# Patient Record
Sex: Female | Born: 1985 | Race: White | Hispanic: No | Marital: Married | Smoking: Never smoker
Health system: Southern US, Community
[De-identification: ages and names within clinical notes are randomized; demographics above are authoritative.]

## PROBLEM LIST (undated history)

## (undated) DIAGNOSIS — N809 Endometriosis, unspecified: Secondary | ICD-10-CM

## (undated) DIAGNOSIS — F419 Anxiety disorder, unspecified: Secondary | ICD-10-CM

## (undated) DIAGNOSIS — T7840XA Allergy, unspecified, initial encounter: Secondary | ICD-10-CM

## (undated) DIAGNOSIS — J939 Pneumothorax, unspecified: Secondary | ICD-10-CM

## (undated) DIAGNOSIS — T8859XA Other complications of anesthesia, initial encounter: Secondary | ICD-10-CM

## (undated) DIAGNOSIS — K648 Other hemorrhoids: Secondary | ICD-10-CM

## (undated) DIAGNOSIS — D649 Anemia, unspecified: Secondary | ICD-10-CM

## (undated) HISTORY — PX: LAPAROSCOPIC BOWEL RESECTION: SUR754

## (undated) HISTORY — PX: WISDOM TOOTH EXTRACTION: SHX21

## (undated) HISTORY — DX: Pneumothorax, unspecified: J93.9

## (undated) HISTORY — PX: ABDOMINAL HYSTERECTOMY: SHX81

## (undated) HISTORY — DX: Anxiety disorder, unspecified: F41.9

## (undated) HISTORY — DX: Other hemorrhoids: K64.8

## (undated) HISTORY — DX: Allergy, unspecified, initial encounter: T78.40XA

## (undated) HISTORY — PX: APPENDECTOMY: SHX54

## (undated) HISTORY — PX: TONSILLECTOMY: SUR1361

## (undated) HISTORY — DX: Endometriosis, unspecified: N80.9

---
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
## 2006-07-12 ENCOUNTER — Other Ambulatory Visit: Admission: RE | Admit: 2006-07-12 | Discharge: 2006-07-12 | Payer: Self-pay | Admitting: Family Medicine

## 2007-07-25 ENCOUNTER — Other Ambulatory Visit: Admission: RE | Admit: 2007-07-25 | Discharge: 2007-07-25 | Payer: Self-pay | Admitting: Family Medicine

## 2008-07-26 ENCOUNTER — Other Ambulatory Visit: Admission: RE | Admit: 2008-07-26 | Discharge: 2008-07-26 | Payer: Self-pay | Admitting: Family Medicine

## 2009-07-27 ENCOUNTER — Other Ambulatory Visit: Admission: RE | Admit: 2009-07-27 | Discharge: 2009-07-27 | Payer: Self-pay | Admitting: Family Medicine

## 2010-08-08 ENCOUNTER — Other Ambulatory Visit: Payer: Self-pay | Admitting: Family Medicine

## 2010-08-09 ENCOUNTER — Other Ambulatory Visit (HOSPITAL_COMMUNITY)
Admission: RE | Admit: 2010-08-09 | Discharge: 2010-08-09 | Disposition: A | Discharge status: Home / Self Care | Payer: BC Managed Care – PPO | Source: Ambulatory Visit | Attending: Family Medicine | Admitting: Family Medicine

## 2010-08-09 DIAGNOSIS — Z0000 Encounter for general adult medical examination without abnormal findings: Secondary | ICD-10-CM | POA: Insufficient documentation

## 2014-11-15 ENCOUNTER — Other Ambulatory Visit: Payer: Self-pay | Admitting: Otolaryngology

## 2014-11-15 DIAGNOSIS — H9209 Otalgia, unspecified ear: Secondary | ICD-10-CM

## 2014-11-15 DIAGNOSIS — H7290 Unspecified perforation of tympanic membrane, unspecified ear: Secondary | ICD-10-CM

## 2014-11-15 DIAGNOSIS — J342 Deviated nasal septum: Secondary | ICD-10-CM

## 2014-11-15 DIAGNOSIS — H6532 Chronic mucoid otitis media, left ear: Secondary | ICD-10-CM

## 2014-11-15 DIAGNOSIS — H9210 Otorrhea, unspecified ear: Secondary | ICD-10-CM

## 2014-11-16 ENCOUNTER — Other Ambulatory Visit: Payer: Self-pay | Admitting: Otolaryngology

## 2014-11-16 DIAGNOSIS — J342 Deviated nasal septum: Secondary | ICD-10-CM

## 2014-11-16 DIAGNOSIS — H9209 Otalgia, unspecified ear: Secondary | ICD-10-CM

## 2014-11-16 DIAGNOSIS — H9210 Otorrhea, unspecified ear: Secondary | ICD-10-CM

## 2014-11-16 DIAGNOSIS — H6532 Chronic mucoid otitis media, left ear: Secondary | ICD-10-CM

## 2014-11-16 DIAGNOSIS — H7290 Unspecified perforation of tympanic membrane, unspecified ear: Secondary | ICD-10-CM

## 2014-11-17 ENCOUNTER — Ambulatory Visit
Admission: RE | Admit: 2014-11-17 | Discharge: 2014-11-17 | Disposition: A | Discharge status: Home / Self Care | Payer: BLUE CROSS/BLUE SHIELD | Source: Ambulatory Visit | Attending: Otolaryngology | Admitting: Otolaryngology

## 2014-11-17 DIAGNOSIS — J342 Deviated nasal septum: Secondary | ICD-10-CM

## 2014-11-17 DIAGNOSIS — H9209 Otalgia, unspecified ear: Secondary | ICD-10-CM

## 2014-11-17 DIAGNOSIS — H6532 Chronic mucoid otitis media, left ear: Secondary | ICD-10-CM

## 2014-11-17 DIAGNOSIS — H7290 Unspecified perforation of tympanic membrane, unspecified ear: Secondary | ICD-10-CM

## 2014-11-17 DIAGNOSIS — H9210 Otorrhea, unspecified ear: Secondary | ICD-10-CM

## 2014-11-17 IMAGING — CT CT MAXILLOFACIAL W/O CM
2 of 3 series · 15 of 37 positions shown, 18 images · non-contrast
Comparison: None.

ADDENDUM:
CT maxillofacial: These examinations have become linked and cannot
be separated, therefore the CT maxillofacial report is provided
here.

Frontal sinuses are clear. The right frontal sinus is hypoplastic.
Ethmoid sinuses are clear. Sphenoid sinus is clear. Maxillary
sinuses are clear. No fluid, mucosal thickening, polyp, cyst or
mass. Nasal septum bows towards the left 3 mm. Ostiomeatal complexes
are widely patent bilaterally. No abnormality seen associated with
left-sided pain. No evidence of dental disease. Bony structures do
not show any significant finding. Temporomandibular joints appear
normal. Soft tissues of the face appear normal. Orbital structures
are normal.
CLINICAL DATA: Left ear pain and drainage, 8 weeks duration. Not
responding to antibiotic treatment.
EXAM:
CT MAXILLOFACIAL WITHOUT CONTRAST
TECHNIQUE: Multidetector CT imaging of the maxillofacial structures was
performed. Multiplanar CT image reconstructions were also generated.
A small metallic BB was placed on the right temple in order to
reliably differentiate right from left.

[Series 601: coronal facial · coronal · 0.33mm/px · 3 of 75 slices shown]
[im 25/75  bone]
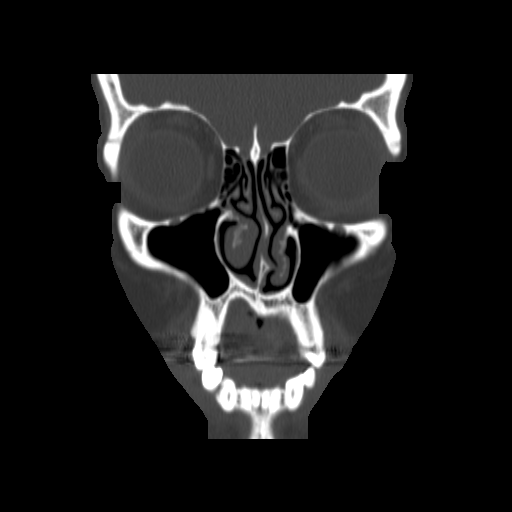
[im 38/75  bone]
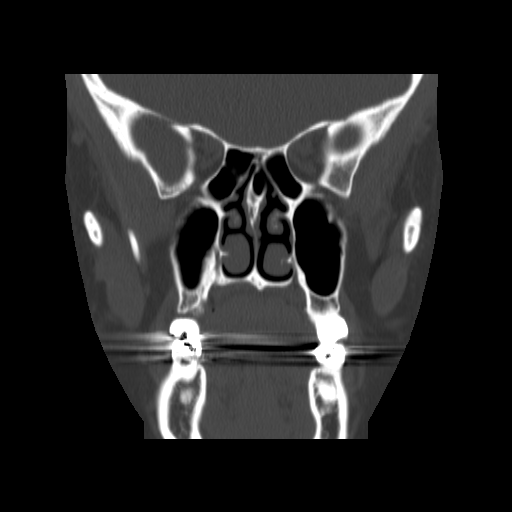
[im 50/75  bone]
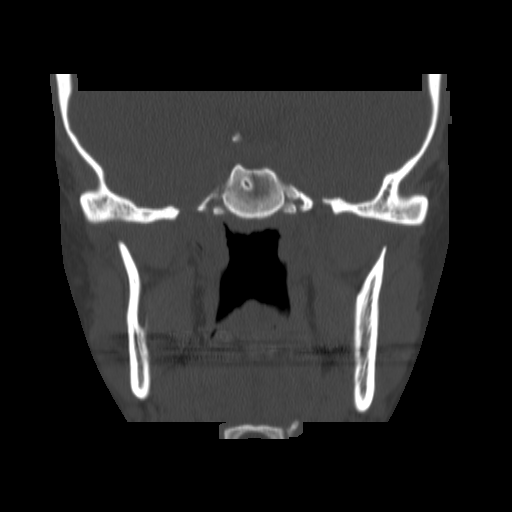

[Series 602: sagittal facial · sagittal · 0.33mm/px · 12 of 79 slices shown, 15 images]
[im 5/79  brain]
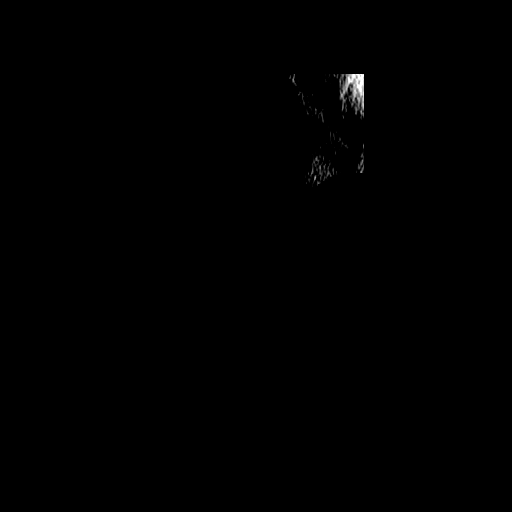
[im 5/79  bone]
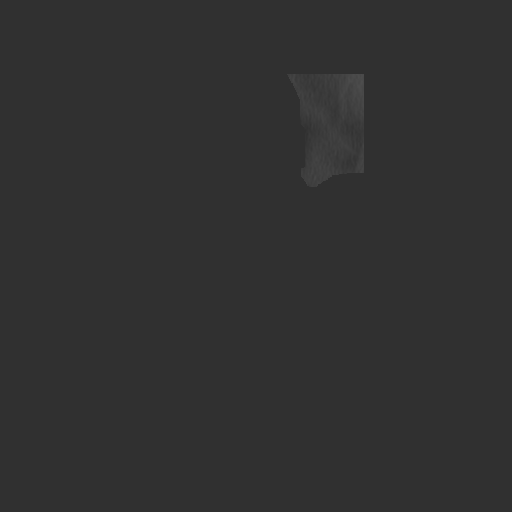
[im 14/79  bone]
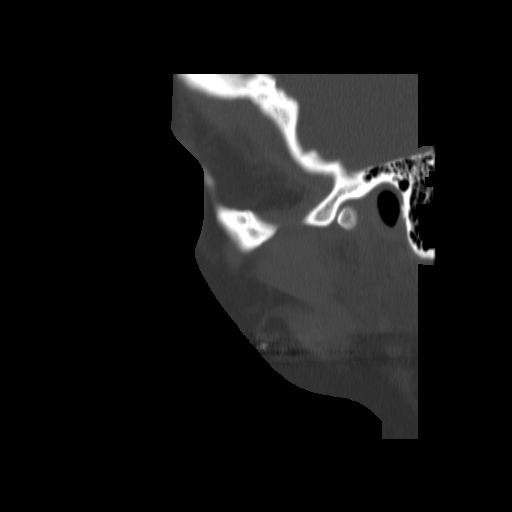
[im 18/79  bone]
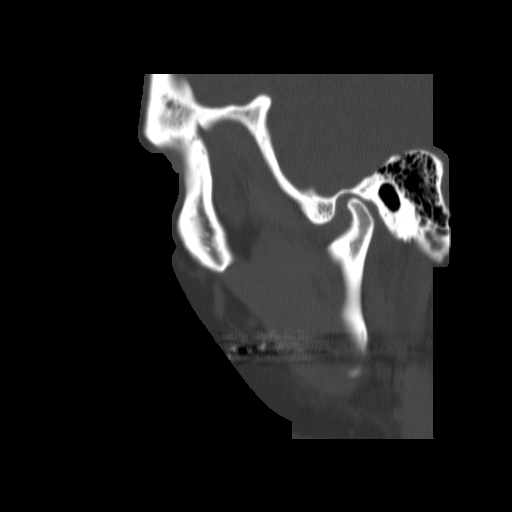
[im 22/79  bone]
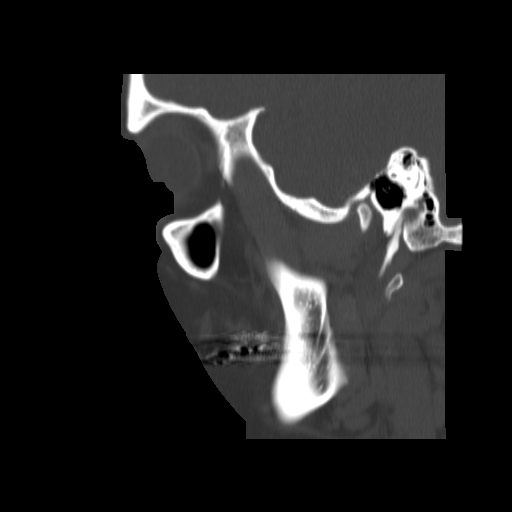
[im 31/79  brain]
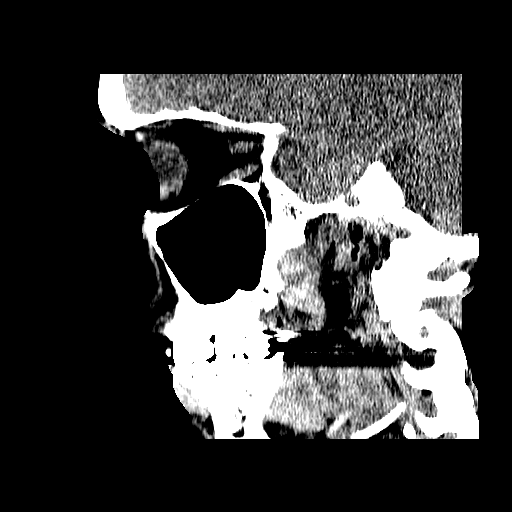
[im 31/79  bone]
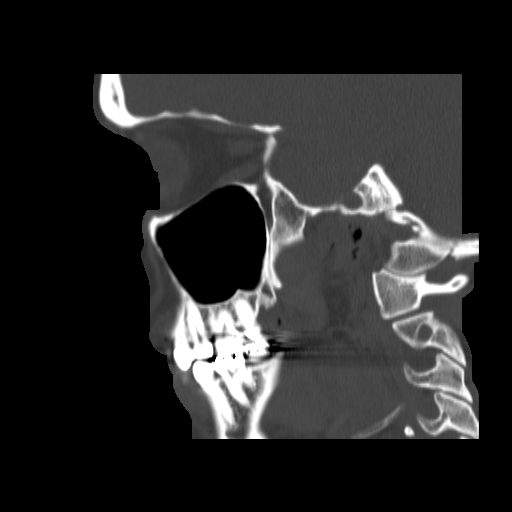
[im 35/79  bone]
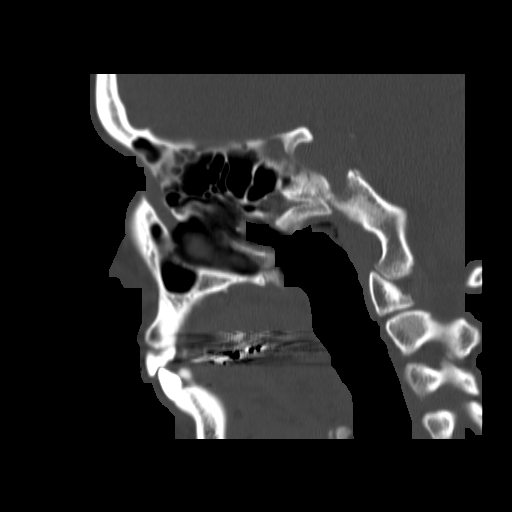
[im 44/79  bone]
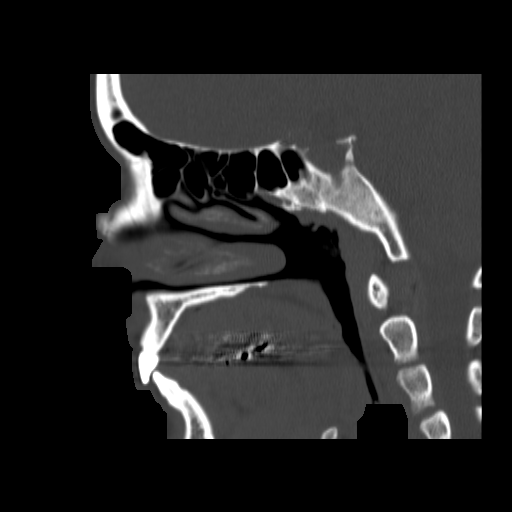
[im 48/79  bone]
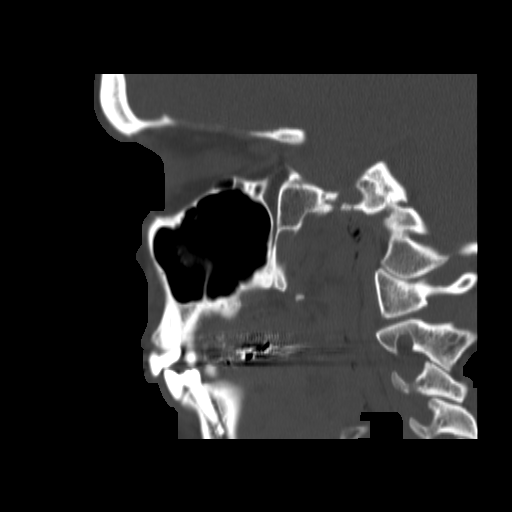
[im 57/79  brain]
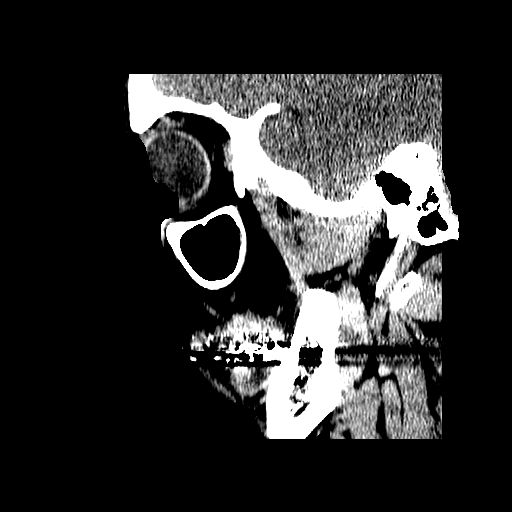
[im 57/79  bone]
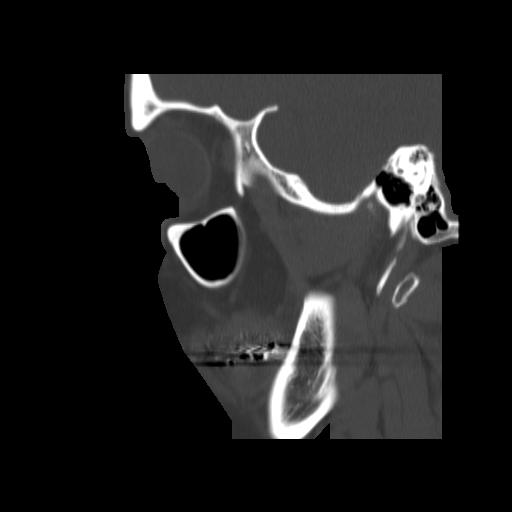
[im 61/79  bone]
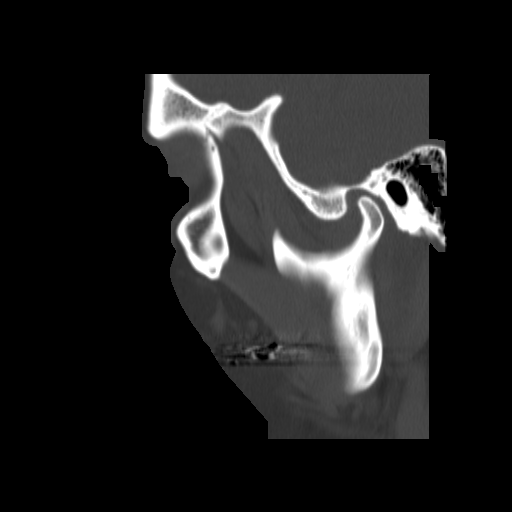
[im 66/79  bone]
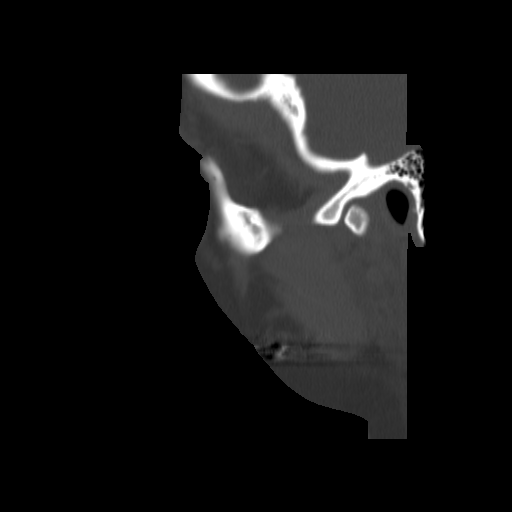
[im 74/79  bone]
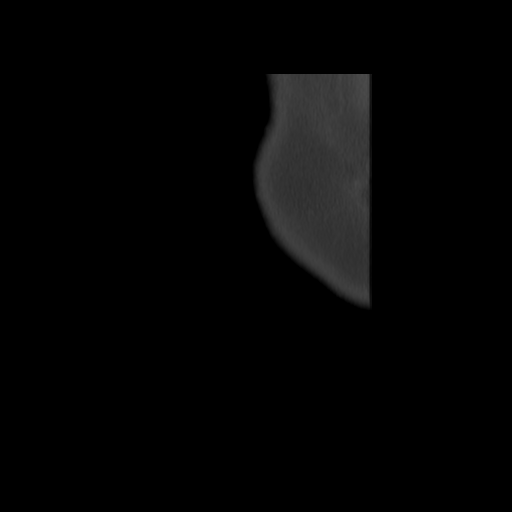

[15 of 37 positions shown; findings below may reference images not displayed]

IMPRESSION: IMPRESSION
Normal CT scan of the maxillofacial structures except for nasal
septal deviation towards the left.
FINDINGS: There is no identifiable abnormality by CT. The external auditory
canals appear normal. Tympanic membranes appear normal bilaterally.
Ossicular chains appear normal bilaterally. No fluid in the middle
ear or attic on either side. Mastoid air cells are clear
bilaterally. Inner ear structures appear normal bilaterally.
IMPRESSION: Normal CT scan of the temporal bones. No abnormality is seen to
correlate with the given clinical history of left ear pain and
drainage.

## 2014-11-17 IMAGING — CT CT TEMPORAL BONES W/O CM
4 of 6 series · 18 of 30 positions shown, 19 images · non-contrast
Comparison: None.

ADDENDUM:
CT maxillofacial: These examinations have become linked and cannot
be separated, therefore the CT maxillofacial report is provided
here.

Frontal sinuses are clear. The right frontal sinus is hypoplastic.
Ethmoid sinuses are clear. Sphenoid sinus is clear. Maxillary
sinuses are clear. No fluid, mucosal thickening, polyp, cyst or
mass. Nasal septum bows towards the left 3 mm. Ostiomeatal complexes
are widely patent bilaterally. No abnormality seen associated with
left-sided pain. No evidence of dental disease. Bony structures do
not show any significant finding. Temporomandibular joints appear
normal. Soft tissues of the face appear normal. Orbital structures
are normal.
CLINICAL DATA: Left ear pain and drainage, 8 weeks duration. Not
responding to antibiotic treatment.
EXAM:
CT MAXILLOFACIAL WITHOUT CONTRAST
TECHNIQUE: Multidetector CT imaging of the maxillofacial structures was
performed. Multiplanar CT image reconstructions were also generated.
A small metallic BB was placed on the right temple in order to
reliably differentiate right from left.

[Series 3: ax mag right · axial · 0.20mm/px · z∈[-8,+17]mm · 3 of 161 slices shown]
[im 41/161  bone]
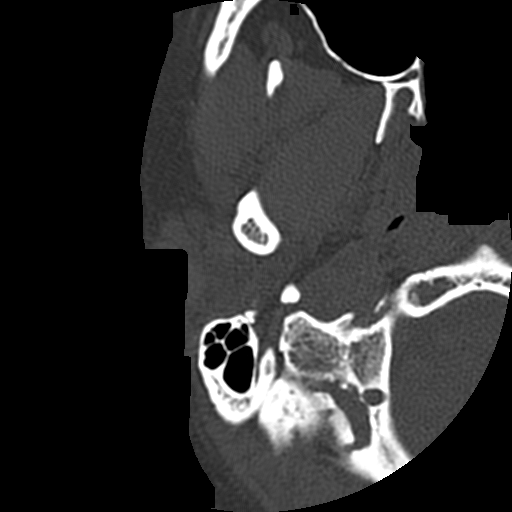
[im 81/161  bone]
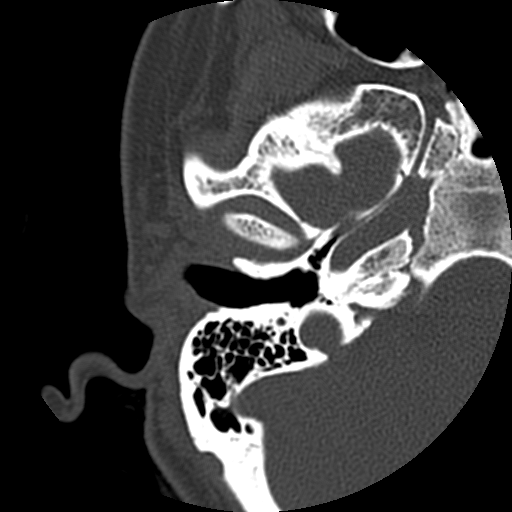
[im 121/161  bone]
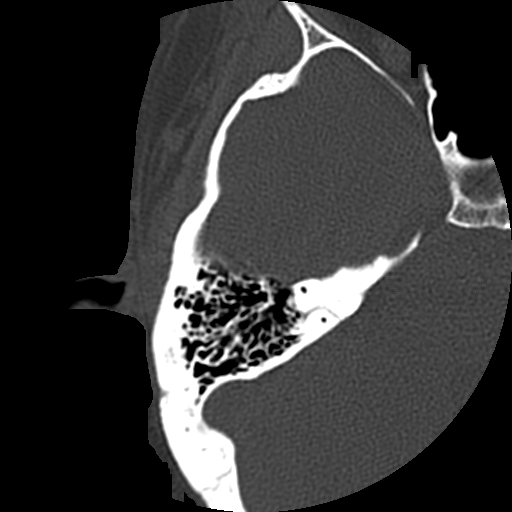

[Series 200: coronal bone · axial · 0.33mm/px · z∈[+5,+89]mm · 3 of 93 slices shown, 4 images]
[im 1/93  brain]
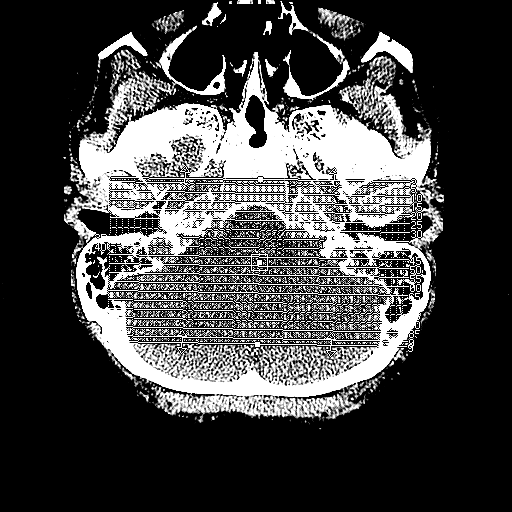
[im 1/93  bone]
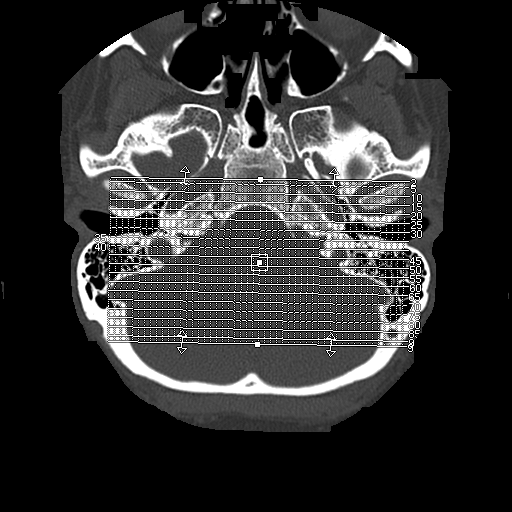
[im 47/93  bone]
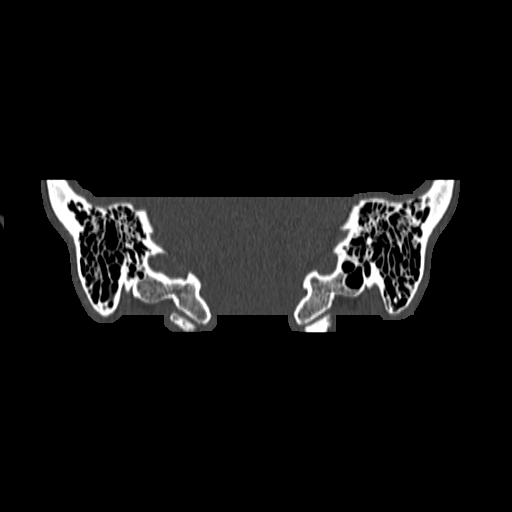
[im 93/93  bone]
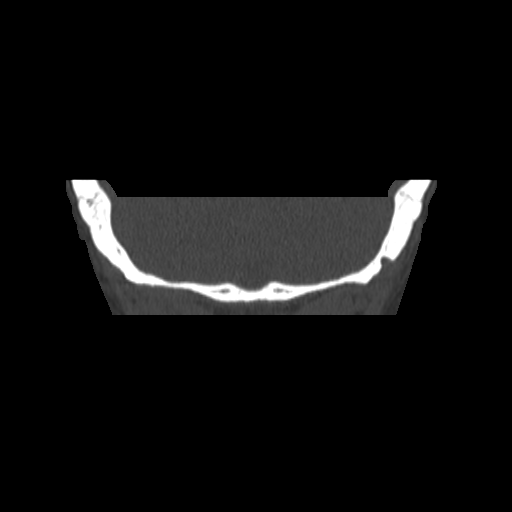

[Series 300: cor mag right · coronal · 0.20mm/px · 6 of 246 slices shown]
[im 36/246  bone]
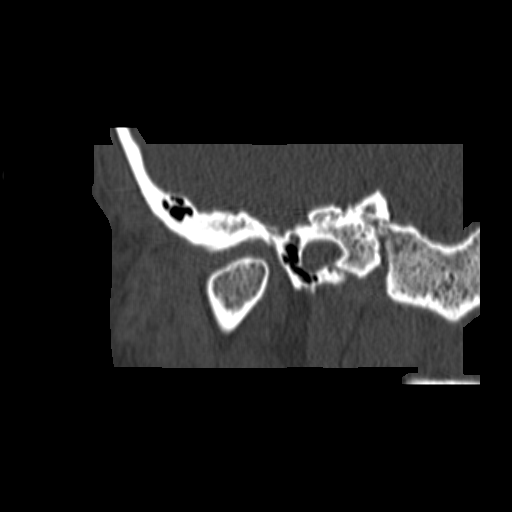
[im 71/246  bone]
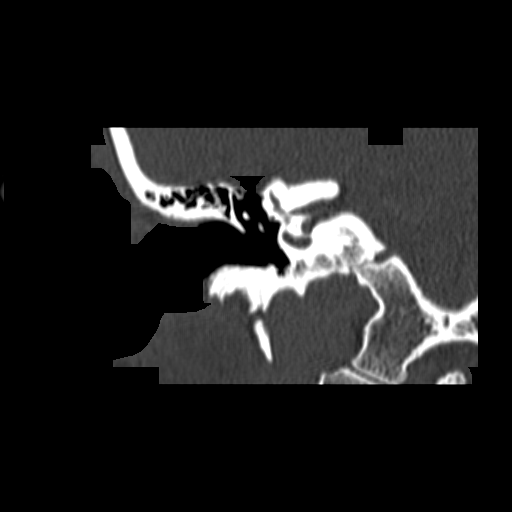
[im 106/246  bone]
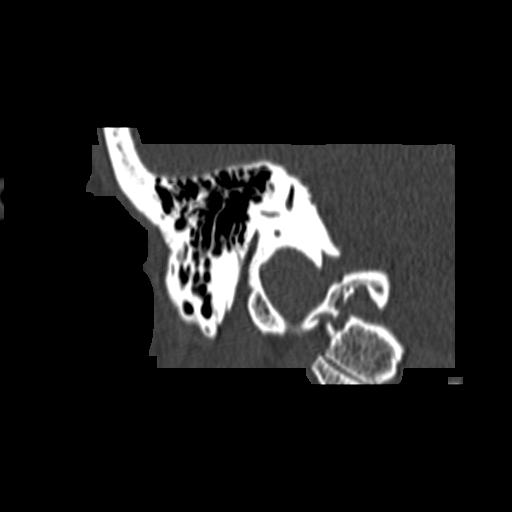
[im 141/246  bone]
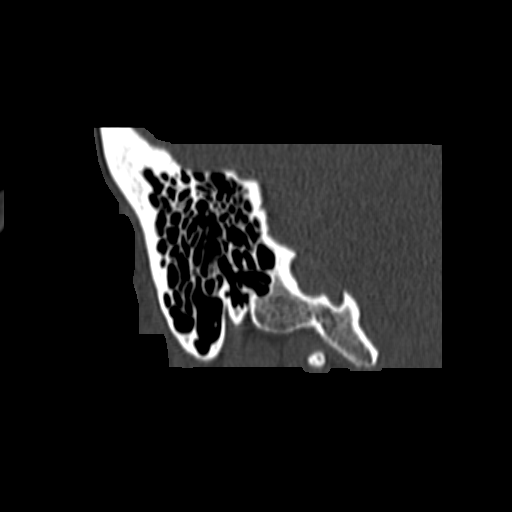
[im 176/246  bone]
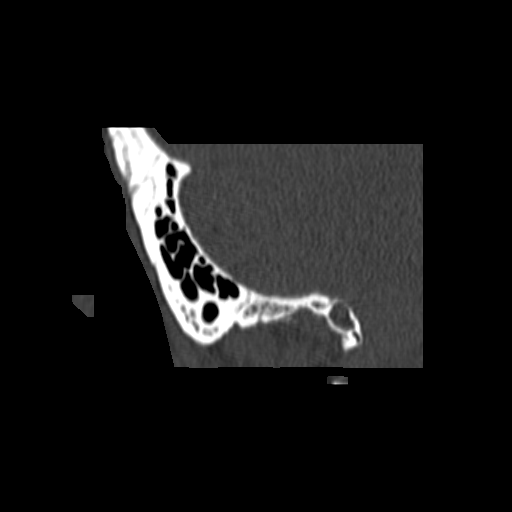
[im 211/246  bone]
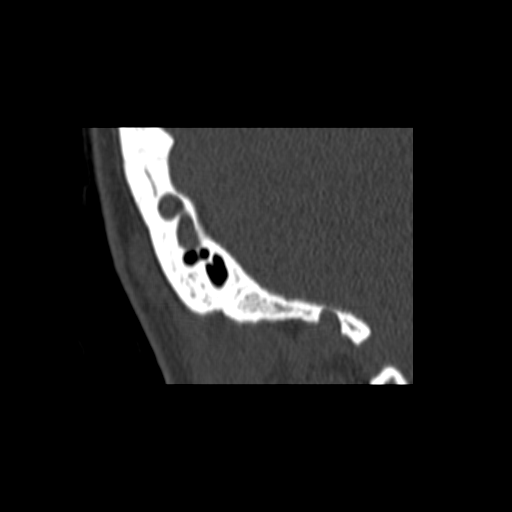

[Series 400: cor mag left · coronal · 0.20mm/px · 6 of 269 slices shown]
[im 39/269  bone]
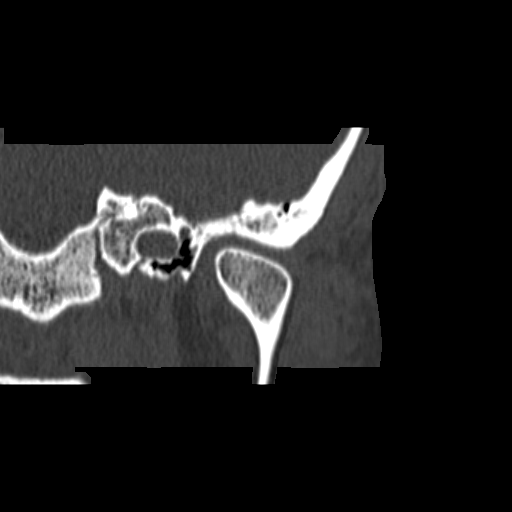
[im 77/269  bone]
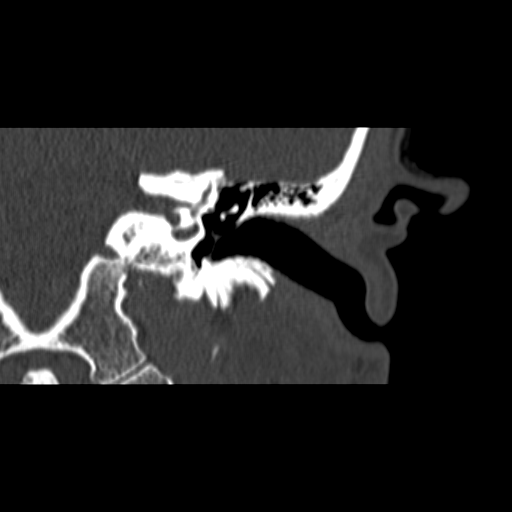
[im 115/269  bone]
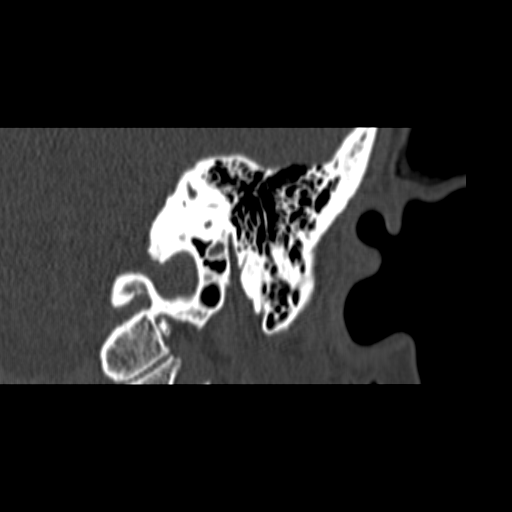
[im 154/269  bone]
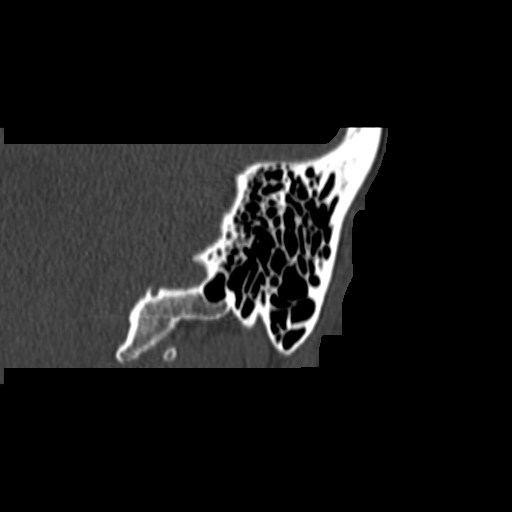
[im 192/269  bone]
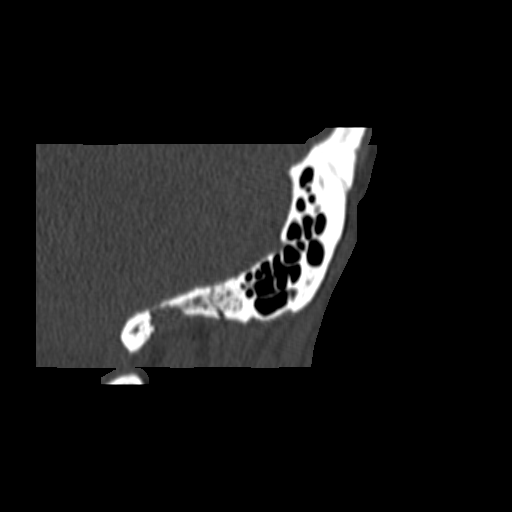
[im 230/269  bone]
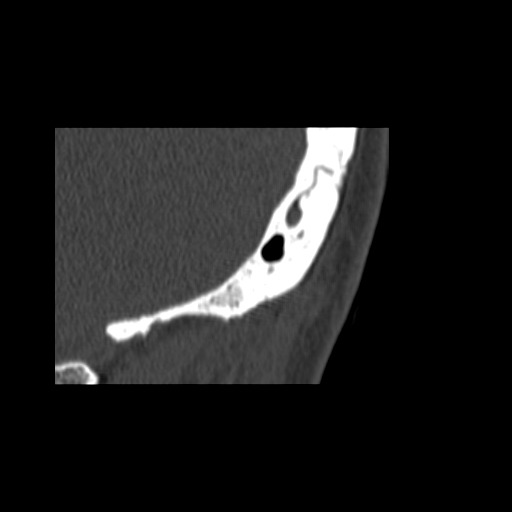

[18 of 30 positions shown; findings below may reference images not displayed]

IMPRESSION: IMPRESSION
Normal CT scan of the maxillofacial structures except for nasal
septal deviation towards the left.
FINDINGS: There is no identifiable abnormality by CT. The external auditory
canals appear normal. Tympanic membranes appear normal bilaterally.
Ossicular chains appear normal bilaterally. No fluid in the middle
ear or attic on either side. Mastoid air cells are clear
bilaterally. Inner ear structures appear normal bilaterally.
IMPRESSION: Normal CT scan of the temporal bones. No abnormality is seen to
correlate with the given clinical history of left ear pain and
drainage.

## 2015-04-25 ENCOUNTER — Other Ambulatory Visit: Payer: Self-pay | Admitting: Family Medicine

## 2015-04-25 ENCOUNTER — Other Ambulatory Visit (HOSPITAL_COMMUNITY)
Admission: RE | Admit: 2015-04-25 | Discharge: 2015-04-25 | Disposition: A | Discharge status: Home / Self Care | Payer: BLUE CROSS/BLUE SHIELD | Source: Ambulatory Visit | Attending: Family Medicine | Admitting: Family Medicine

## 2015-04-25 DIAGNOSIS — Z01419 Encounter for gynecological examination (general) (routine) without abnormal findings: Secondary | ICD-10-CM | POA: Diagnosis not present

## 2015-04-27 LAB — CYTOLOGY - PAP

## 2017-07-01 DIAGNOSIS — F411 Generalized anxiety disorder: Secondary | ICD-10-CM | POA: Diagnosis not present

## 2018-07-01 ENCOUNTER — Other Ambulatory Visit: Payer: Self-pay | Admitting: Family Medicine

## 2018-07-01 ENCOUNTER — Other Ambulatory Visit (HOSPITAL_COMMUNITY)
Admission: RE | Admit: 2018-07-01 | Discharge: 2018-07-01 | Disposition: A | Discharge status: Home / Self Care | Payer: BLUE CROSS/BLUE SHIELD | Source: Ambulatory Visit | Attending: Family Medicine | Admitting: Family Medicine

## 2018-07-01 DIAGNOSIS — Z0000 Encounter for general adult medical examination without abnormal findings: Secondary | ICD-10-CM | POA: Insufficient documentation

## 2018-07-01 DIAGNOSIS — Z124 Encounter for screening for malignant neoplasm of cervix: Secondary | ICD-10-CM | POA: Diagnosis not present

## 2018-07-01 DIAGNOSIS — F411 Generalized anxiety disorder: Secondary | ICD-10-CM | POA: Diagnosis not present

## 2018-07-01 DIAGNOSIS — Z13220 Encounter for screening for lipoid disorders: Secondary | ICD-10-CM | POA: Diagnosis not present

## 2018-07-02 LAB — CYTOLOGY - PAP
Diagnosis: NEGATIVE
HPV: NOT DETECTED

## 2018-07-09 DIAGNOSIS — H10413 Chronic giant papillary conjunctivitis, bilateral: Secondary | ICD-10-CM | POA: Diagnosis not present

## 2018-07-09 DIAGNOSIS — H52203 Unspecified astigmatism, bilateral: Secondary | ICD-10-CM | POA: Diagnosis not present

## 2018-07-09 DIAGNOSIS — H5213 Myopia, bilateral: Secondary | ICD-10-CM | POA: Diagnosis not present

## 2018-07-22 DIAGNOSIS — H938X2 Other specified disorders of left ear: Secondary | ICD-10-CM | POA: Diagnosis not present

## 2018-07-22 DIAGNOSIS — H6122 Impacted cerumen, left ear: Secondary | ICD-10-CM | POA: Diagnosis not present

## 2019-09-25 ENCOUNTER — Encounter: Payer: Self-pay | Admitting: Nurse Practitioner

## 2019-10-21 ENCOUNTER — Encounter: Payer: Self-pay | Admitting: Nurse Practitioner

## 2019-10-21 ENCOUNTER — Other Ambulatory Visit: Payer: Self-pay

## 2019-10-21 ENCOUNTER — Ambulatory Visit (INDEPENDENT_AMBULATORY_CARE_PROVIDER_SITE_OTHER): Payer: No Typology Code available for payment source | Admitting: Nurse Practitioner

## 2019-10-21 VITALS — BP 108/60 | HR 81 | Resp 16 | Ht 66.0 in | Wt 129.2 lb

## 2019-10-21 DIAGNOSIS — R194 Change in bowel habit: Secondary | ICD-10-CM | POA: Diagnosis not present

## 2019-10-21 DIAGNOSIS — R1032 Left lower quadrant pain: Secondary | ICD-10-CM

## 2019-10-21 DIAGNOSIS — K6289 Other specified diseases of anus and rectum: Secondary | ICD-10-CM | POA: Diagnosis not present

## 2019-10-21 DIAGNOSIS — R1031 Right lower quadrant pain: Secondary | ICD-10-CM

## 2019-10-21 MED ORDER — SUTAB 1479-225-188 MG PO TABS: 1.0000 | ORAL_TABLET | Freq: Once | ORAL | 0 refills | Status: AC

## 2019-10-21 NOTE — Patient Instructions (Addendum)
If you are age 36 or older, your body mass index should be between 23-30. Your Body mass index is 20.85 kg/m. If this is out of the aforementioned range listed, please consider follow up with your Primary Care Provider.  If you are age 66 or younger, your body mass index should be between 19-25. Your Body mass index is 20.85 kg/m. If this is out of the aformentioned range listed, please consider follow up with your Primary Care Provider.   We have sent the following medications to your pharmacy for you to pick up at your convenience: Bentyl 20 mg twice daily as needed for abdominal pain.  Start Citrucel 2 tablespoons in 8 ounces of water daily.  Miralax 1 capful daily in 8 ounces of water as needed for constipation.   You have been scheduled for a colonoscopy. Please follow written instructions given to you at your visit today.  Please pick up your prep supplies at the pharmacy within the next 1-3 days. If you use inhalers (even only as needed), please bring them with you on the day of your procedure.

## 2019-10-21 NOTE — Progress Notes (Signed)
ASSESSMENT / PLAN:     # Bowel changes / intermittent rectal pressure and lower abdominal discomfort --Occurs one week following menstrual cycle.  May be hormonal however she has had some unintentional weight loss.  --For further evaluation will schedule patient for colonoscopy with Dr. Hilarie Fredrickson per patient's request.  The risks and benefits of colonoscopy with possible polypectomy / biopsies were discussed and the patient agrees to proceed.  --I have asked her to make an appointment with gynecology, she has not had a female exam in quite some time --Trial of dicyclomine to take following menstrual cycle when she gets lower abdominal pain.  This may help with the concurrent diarrhea --Start Citrucel, 2 tablespoons in 8 ounces of water daily --If continues to get constipated during certain times of the month she can use MiraLAX as needed    # Mild normocytic anemia.   --Hemoglobin 11.8 / MCV 82.   --Most likely related to history of heavy menses  # Mild hyperbilirubinemia --Total bilirubin 1.4.  Remainder of LFTs normal --Recommend PCP repeat LFTs in 6 months  HPI:     Chief Complaint:     Yvonne Garcia is a 34 year old female, new to the practice and self-referred. Her husband is a patient of Dr. Valerie Salts gives a history of chronic rectal pressure associated with menstrual cycles.  Over the last few months Yvonne Garcia has been having rectal pressure and lower abdominal pain following her menstrual cycles.  During this time she also has increased frequency of bowel movements ranging in consistency from soft to loose. These symptoms last about one week. Following that, bowel movements return to normal but then she eventually gets constipated until the cycle repeats itself.  She had problems with constipation many years ago but it had had resolved.  She cannot relate GI symptoms to any recent dietary changes.  She has not had any recent medication changes.  She has not seen any  blood in her stool except for 1 year ago she had 2 days of self-limited rectal bleeding attributed to a hemorrhoid. Her actual menstrual cycles have not changed in any way.  She has heavy bleeding during her cycles and has been concerned about the possibility of endometriosis.    Family medical history pertinent for colorectal cancer in maternal grandfather in his 16.  Yvonne Garcia gained weight last year through the Covid pandemic.  Now she has unintentionally lost 10 to 12 pounds since January.  She admits to getting scared to eat sometimes because of the amount of loose stool she has in the week following menstrual cycle.  Recent labs by PCP unremarkable except for mild anemia.  Her thyroid stimulating hormone was normal.  Total bilirubin minimally elevated at 1.4.   Data Reviewed:   10/05/2019 Hemoglobin 11.8, MCV 82, WBC 6.7, TSH 3.06, renal function normal.  Total bilirubin 1.4   Past Medical History:  Diagnosis Date  . Allergies   . Anxiety      Past Surgical History:  Procedure Laterality Date  . TONSILLECTOMY     Family History  Problem Relation Age of Onset  . Mental illness Mother        suicide.   . Pancreatic cancer Mother   . Breast cancer Mother   . Kidney cancer Father   . Colon cancer Maternal Grandfather        age 45s   Social History   Tobacco Use  .  Smoking status: Never Smoker  . Smokeless tobacco: Never Used  Substance Use Topics  . Alcohol use: Not on file  . Drug use: Not on file   Current Outpatient Medications  Medication Sig Dispense Refill  . clonazePAM (KLONOPIN) 0.5 MG tablet Take 1 tablet by mouth as needed.    . fluticasone (FLONASE) 50 MCG/ACT nasal spray Place into both nostrils daily.     No current facility-administered medications for this visit.   No Known Allergies   Review of Systems: Positive for allergy, anxiety, headaches, menstrual pain, urination.  All other systems reviewed and negative except where noted in HPI.    Creatinine clearance cannot be calculated (No successful lab value found.)   Physical Exam:    Wt Readings from Last 3 Encounters:  10/21/19 129 lb 3.2 oz (58.6 kg)    BP 108/60   Pulse 81   Resp 16   Ht 5\' 6"  (1.676 m)   Wt 129 lb 3.2 oz (58.6 kg)   SpO2 98%   BMI 20.85 kg/m  Constitutional:  Pleasant female in no acute distress. Psychiatric: Normal mood and affect. Behavior is normal. EENT: Pupils normal.  Conjunctivae are normal. No scleral icterus. Neck supple.  Cardiovascular: Normal rate, regular rhythm. No edema Pulmonary/chest: Effort normal and breath sounds normal. No wheezing, rales or rhonchi. Abdominal: Soft, nondistended, nontender. Bowel sounds active throughout. There are no masses palpable. No hepatomegaly. Rectal: A few small protruding hemorrhoids, easily reduced.  No masses felt on DRE Neurological: Alert and oriented to person place and time. Skin: Skin is warm and dry. No rashes noted.  , NP  10/21/2019, 9:05 AM

## 2019-10-26 ENCOUNTER — Encounter: Payer: Self-pay | Admitting: Internal Medicine

## 2019-10-26 ENCOUNTER — Ambulatory Visit (AMBULATORY_SURGERY_CENTER): Payer: No Typology Code available for payment source | Admitting: Internal Medicine

## 2019-10-26 ENCOUNTER — Other Ambulatory Visit: Payer: Self-pay

## 2019-10-26 VITALS — BP 112/64 | HR 61 | Temp 97.5°F | Resp 13 | Ht 66.0 in | Wt 129.0 lb

## 2019-10-26 DIAGNOSIS — K649 Unspecified hemorrhoids: Secondary | ICD-10-CM | POA: Diagnosis not present

## 2019-10-26 DIAGNOSIS — K6389 Other specified diseases of intestine: Secondary | ICD-10-CM | POA: Diagnosis not present

## 2019-10-26 DIAGNOSIS — R1030 Lower abdominal pain, unspecified: Secondary | ICD-10-CM

## 2019-10-26 DIAGNOSIS — R194 Change in bowel habit: Secondary | ICD-10-CM

## 2019-10-26 MED ORDER — SODIUM CHLORIDE 0.9 % IV SOLN
500.0000 mL | Freq: Once | INTRAVENOUS | Status: DC
Start: 2019-10-26 — End: 2019-10-26

## 2019-10-26 NOTE — Progress Notes (Signed)
Pt's states no medical or surgical changes since previsit or office visit. 

## 2019-10-26 NOTE — Progress Notes (Signed)
Addendum: Reviewed and agree with assessment and management plan. Deshay Blumenfeld M, MD  

## 2019-10-26 NOTE — Progress Notes (Signed)
No problems noted in the recovery room. maw 

## 2019-10-26 NOTE — Op Note (Signed)
[facility] Patient Name: Yvonne Garcia Procedure Date: 10/26/2019 11:23 AM MRN: [id number] Endoscopist: Beverley Fiedler , MD Age: 34 Referring MD:  Date of Birth: 1986/02/15 Gender: Female Account #: [id number] Procedure:                Colonoscopy Indications:              Lower abdominal pain, Change in bowel habits                            particular post menstrual cycle. Medicines:                Monitored Anesthesia Care Procedure:                Pre-Anesthesia Assessment:                           - Prior to the procedure, a History and Physical                            was performed, and patient medications and                            allergies were reviewed. The patient's tolerance of                            previous anesthesia was also reviewed. The risks                            and benefits of the procedure and the sedation                            options and risks were discussed with the patient.                            All questions were answered, and informed consent                            was obtained. Prior Anticoagulants: The patient has                            taken no previous anticoagulant or antiplatelet                            agents. ASA Grade Assessment: I - A normal, healthy                            patient. After reviewing the risks and benefits,                            the patient was deemed in satisfactory condition to                            undergo the procedure.  After obtaining informed consent, the colonoscope                            was passed under direct vision. Throughout the                            procedure, the patient's blood pressure, pulse, and                            oxygen saturations were monitored continuously. The                            Colonoscope was introduced through the anus and                            advanced to the terminal ileum. The colonoscopy  was                            performed without difficulty. The patient tolerated                            the procedure well. The quality of the bowel                            preparation was excellent. The terminal ileum,                            ileocecal valve, appendiceal orifice, and rectum                            were photographed. Scope In: 11:46:07 AM Scope Out: 12:01:58 PM Scope Withdrawal Time: 0 hours 13 minutes 8 seconds  Total Procedure Duration: 0 hours 15 minutes 51 seconds  Findings:                 The terminal ileum appeared normal.                           An area of nodular mucosa was found in the distal                            sigmoid colon. This area is about 3-4 cm in length                            and involves less than 25% of the circumference                            being located between 15-20 cm from the dentate                            line. This did not have the appearance of typical                            colitis nor a traditional  adenomatous colon polyp.                            Biopsies were taken with a cold forceps for                            histology. Query a foci of endometriosis.                           Internal hemorrhoids were found during retroflexion                            and during digital exam. The hemorrhoids were                            medium-sized. Complications:            No immediate complications. Estimated Blood Loss:     Estimated blood loss was minimal. Impression:               - Hemorrhoids found on perianal exam.                           - The examined portion of the ileum was normal.                           - Nodular mucosa in the distal sigmoid colon (as                            above). Biopsied.                           - Internal hemorrhoids. Recommendation:           - Patient has a contact number available for                            emergencies. The signs and symptoms of  potential                            delayed complications were discussed with the                            patient. Return to normal activities tomorrow.                            Written discharge instructions were provided to the                            patient.                           - Resume previous diet.                           - Continue present medications.                           -  Await pathology results.                           - GYN input is recommended as is cross-sectional                            imaging, likely with CT abd/pelvis with contrast                            (however, would wait until GYN input to ensure                            other imaging modality such as MRI is not favored).                           - Repeat colonoscopy is recommended. The                            colonoscopy date will be determined after pathology                            results from today's exam become available for                            review. Beverley Fiedler, MD 10/26/2019 12:09:32 PM This report has been signed electronically.

## 2019-10-26 NOTE — Progress Notes (Signed)
Called to room to assist during endoscopic procedure.  Patient ID and intended procedure confirmed with present staff. Received instructions for my participation in the procedure from the performing physician.  

## 2019-10-26 NOTE — Progress Notes (Signed)
pt tolerated well. VSS. awake and to recovery. Report given to RN.  

## 2019-10-26 NOTE — Patient Instructions (Signed)
YOU HAD AN ENDOSCOPIC PROCEDURE TODAY AT THE [facility]:   Refer to the procedure report that was given to you for any specific questions about what was found during the examination.  If the procedure report does not answer your questions, please call your gastroenterologist to clarify.  If you requested that your care partner not be given the details of your procedure findings, then the procedure report has been included in a sealed envelope for you to review at your convenience later.  YOU SHOULD EXPECT: Some feelings of bloating in the abdomen. Passage of more gas than usual.  Walking can help get rid of the air that was put into your GI tract during the procedure and reduce the bloating. If you had a lower endoscopy (such as a colonoscopy or flexible sigmoidoscopy) you may notice spotting of blood in your stool or on the toilet paper. If you underwent a bowel prep for your procedure, you may not have a normal bowel movement for a few days.  Please Note:  You might notice some irritation and congestion in your nose or some drainage.  This is from the oxygen used during your procedure.  There is no need for concern and it should clear up in a day or so.  SYMPTOMS TO REPORT IMMEDIATELY:   Following lower endoscopy (colonoscopy or flexible sigmoidoscopy):  Excessive amounts of blood in the stool  Significant tenderness or worsening of abdominal pains  Swelling of the abdomen that is new, acute  Fever of 100F or higher   For urgent or emergent issues, a gastroenterologist can be reached at any hour by calling [phone]. Do not use MyChart messaging for urgent concerns.    DIET:  We do recommend a small meal at first, but then you may proceed to your regular diet.  Drink plenty of fluids but you should avoid alcoholic beverages for 24 hours.  ACTIVITY:  You should plan to take it easy for the rest of today and you should NOT DRIVE or use heavy machinery until tomorrow (because  of the sedation medicines used during the test).    FOLLOW UP: Our staff will call the number listed on your records 48-72 hours following your procedure to check on you and address any questions or concerns that you may have regarding the information given to you following your procedure. If we do not reach you, we will leave a message.  We will attempt to reach you two times.  During this call, we will ask if you have developed any symptoms of COVID 19. If you develop any symptoms (ie: fever, flu-like symptoms, shortness of breath, cough etc.) before then, please call [phone].  If you test positive for Covid 19 in the 2 weeks post procedure, please call and report this information to [place].    If any biopsies were taken you will be contacted by phone or by letter within the next 1-3 weeks.  Please call us at [phone] if you have not heard about the biopsies in 3 weeks.    SIGNATURES/CONFIDENTIALITY: You and/or your care partner have signed paperwork which will be entered into your electronic medical record.  These signatures attest to the fact that that the information above on your After Visit Summary has been reviewed and is understood.  Full responsibility of the confidentiality of this discharge information lies with you and/or your care-partner.    Handout was given to your hemorrhoids. You may resume your current medications today.  Patient already has an appointment for November 17, 2019.  Per Dr. Hilarie Fredrickson, call him after the appointment and will go from there. Dr. Hilarie Fredrickson would like OBGYN's input/recommendations. Await biospies results.  Please call if any questions or concerns.

## 2019-10-28 ENCOUNTER — Telehealth: Payer: Self-pay | Admitting: *Deleted

## 2019-10-28 NOTE — Telephone Encounter (Signed)
  Follow up Call-  Call back number 10/26/2019  Post procedure Call Back phone  # [phone]  Permission to leave phone message Yes  Some recent data might be hidden     Patient questions:  Do you have a fever, pain , or abdominal swelling? No. Pain Score  0 *  Have you tolerated food without any problems? Yes.    Have you been able to return to your normal activities? Yes.    Do you have any questions about your discharge instructions: Diet   No. Medications  No. Follow up visit  No.  Do you have questions or concerns about your Care? No.  Actions: * If pain score is 4 or above: No action needed, pain <4.  1. Have you developed a fever since your procedure? no  2.   Have you had an respiratory symptoms (SOB or cough) since your procedure? no  3.   Have you tested positive for COVID 19 since your procedure no  4.   Have you had any family members/close contacts diagnosed with the COVID 19 since your procedure?  no   If yes to any of these questions please route to Laverna Peace, RN and Charlett Lango, RN

## 2019-11-10 ENCOUNTER — Encounter: Payer: BLUE CROSS/BLUE SHIELD | Admitting: Obstetrics and Gynecology

## 2019-11-17 ENCOUNTER — Encounter: Payer: BLUE CROSS/BLUE SHIELD | Admitting: Family Medicine

## 2019-12-10 ENCOUNTER — Other Ambulatory Visit: Payer: Self-pay | Admitting: Obstetrics and Gynecology

## 2019-12-10 DIAGNOSIS — R102 Pelvic and perineal pain: Secondary | ICD-10-CM

## 2019-12-17 NOTE — Telephone Encounter (Signed)
Dottie Please let patient know I got her email She has an MRI scheduled for mid August An appointment with me thereafter would be appropriate, please add her on when there is a cancellation Please let her know this is our plan Thanks JMP

## 2019-12-17 NOTE — Telephone Encounter (Signed)
I have spoken to patient to advise that Dr Rhea Belton has read her email and does think an appointment with him after MRI would be appropriate. Advised we do not currently have any availability, however, I will be placing her on my cancellation list and will contact her as soon as an appointment becomes available. She verbalizes understanding of this.

## 2020-01-01 ENCOUNTER — Encounter: Payer: Self-pay | Admitting: *Deleted

## 2020-01-03 ENCOUNTER — Ambulatory Visit
Admission: RE | Admit: 2020-01-03 | Discharge: 2020-01-03 | Disposition: A | Discharge status: Home / Self Care | Payer: No Typology Code available for payment source | Source: Ambulatory Visit | Attending: Obstetrics and Gynecology | Admitting: Obstetrics and Gynecology

## 2020-01-03 DIAGNOSIS — R102 Pelvic and perineal pain: Secondary | ICD-10-CM

## 2020-01-03 IMAGING — MR MR PELVIS WO/W CM
8 of 10 series · 34 of 48 positions shown · IV contrast (10ml Multihance)
Comparison: [DATE] clinic note

CLINICAL DATA: Lower abdominal pain. Rectal pressure. Change in
bowel habits with menstruation

EXAM:
MRI PELVIS WITHOUT AND WITH CONTRAST
TECHNIQUE: Multiplanar multisequence MR imaging of the pelvis was performed
both before and after administration of intravenous contrast.
CONTRAST:  10mL MULTIHANCE GADOBENATE DIMEGLUMINE 529 MG/ML IV SOLN

[Series 2: T2 · coronal · 5.0mm · 1.25mm/px · 2 of 30 slices shown (1 of 3)]
[im 1/30]
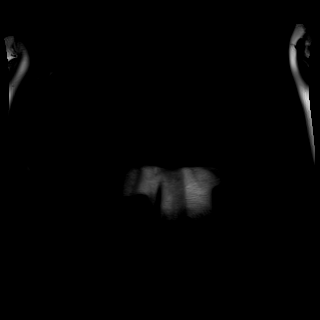
[im 30/30]
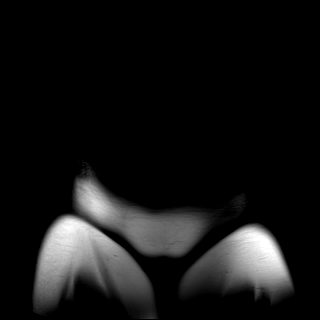

[Series 3: T2 · sagittal · 5.0mm · 0.78mm/px · 2 of 25 slices shown (2 of 3)]
[im 1/25]
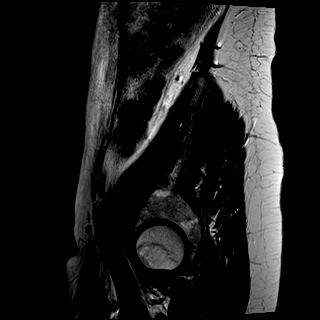
[im 25/25]
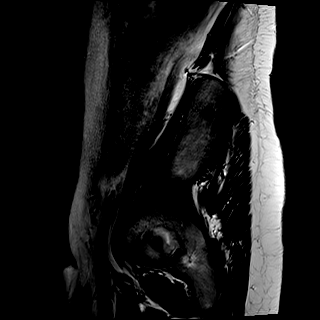

[Series 4: T2 · axial · 5.0mm · 0.55mm/px · z∈[-130,+67]mm · 2 of 34 slices shown (3 of 3)]
[im 1/34]
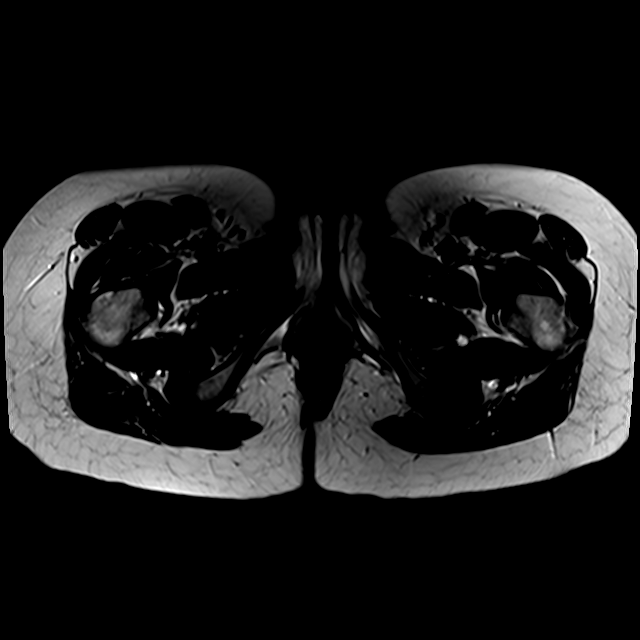
[im 34/34]
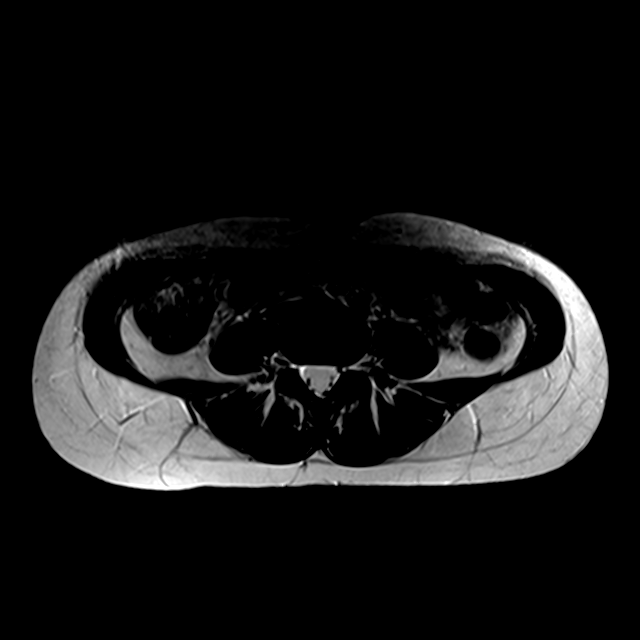

[Series 5: T2 fat-sat · axial · 5.0mm · 0.41mm/px · z∈[-132,+65]mm · 2 of 34 slices shown]
[im 1/34]
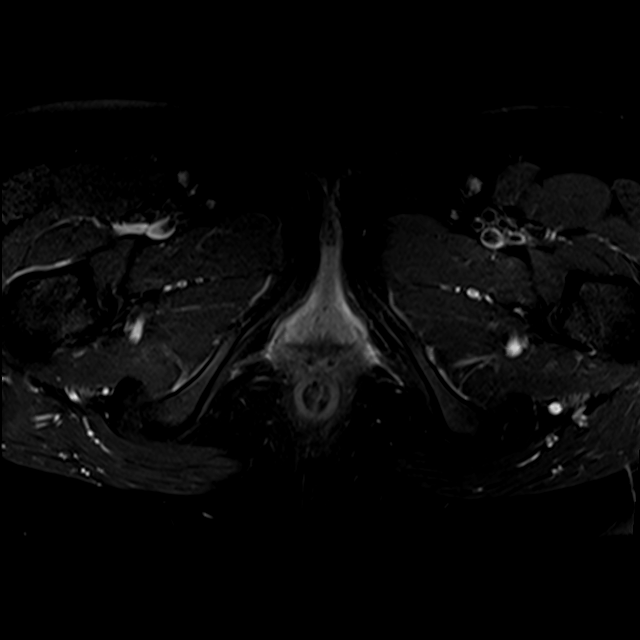
[im 34/34]
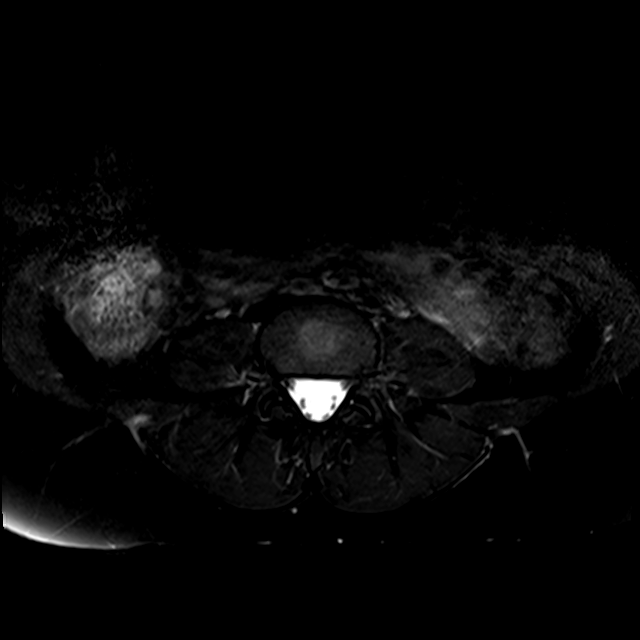

[Series 6: T1 · axial · 1.2mm · 1.09mm/px · z∈[-130,+80]mm · 8 of 176 slices shown]
[im 1/176]
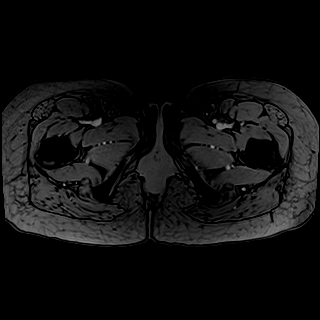
[im 26/176]
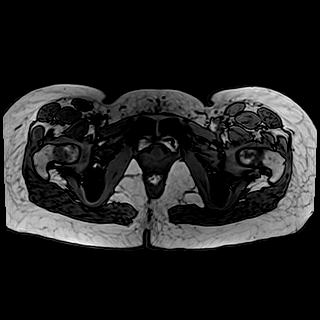
[im 51/176]
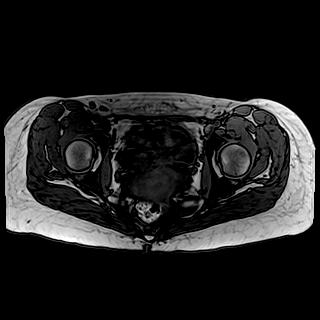
[im 76/176]
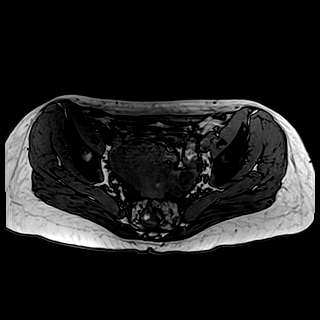
[im 101/176]
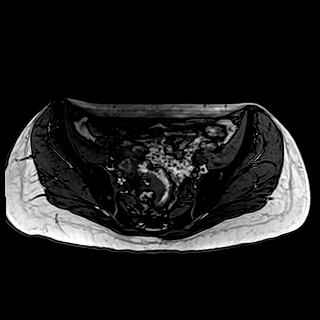
[im 126/176]
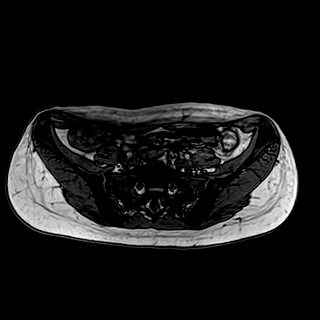
[im 151/176]
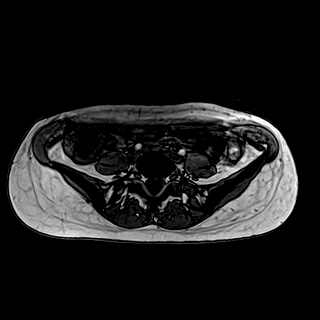
[im 176/176]
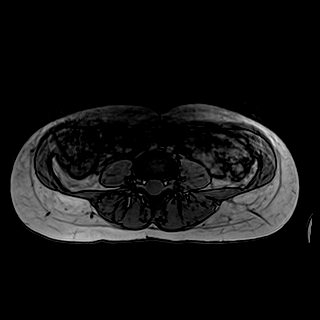

[Series 7: T1 fat-sat · axial · 1.2mm · 1.09mm/px · z∈[-142,+68]mm · 8 of 176 slices shown]
[im 1/176]
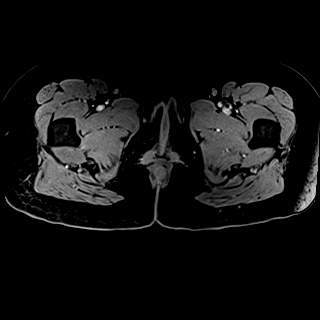
[im 26/176]
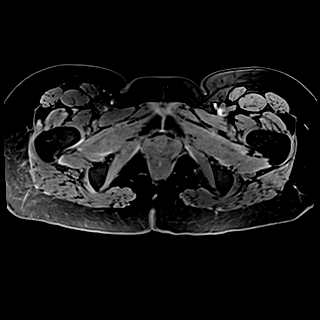
[im 51/176]
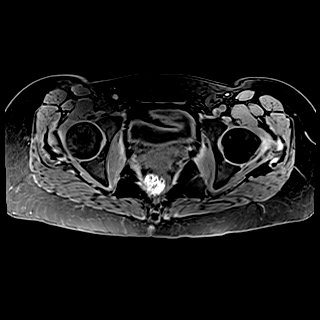
[im 76/176]
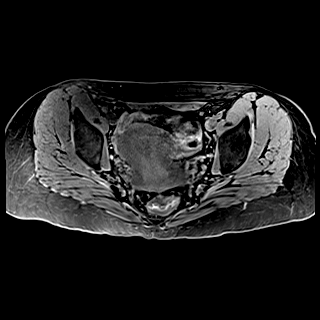
[im 101/176]
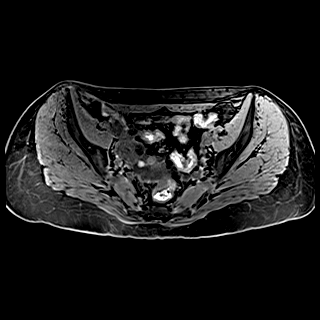
[im 126/176]
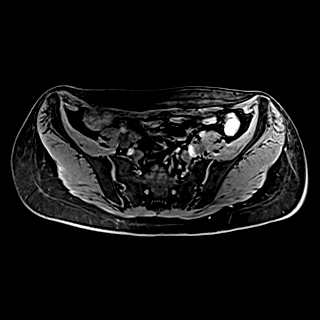
[im 151/176]
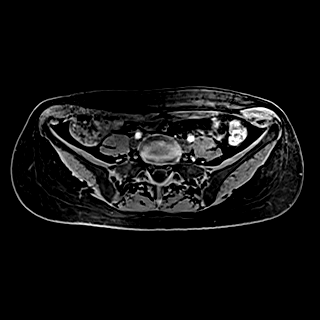
[im 176/176]
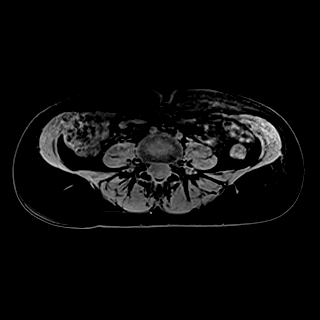

[Series 8: T1 fat-sat post-contrast · axial · 1.2mm · 1.09mm/px · z∈[-142,+68]mm · 8 of 176 slices shown (1 of 2)]
[im 1/176]
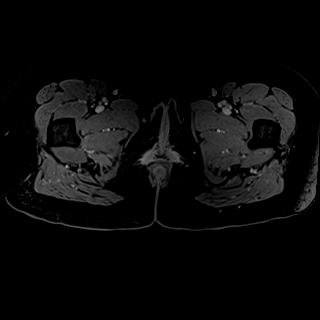
[im 26/176]
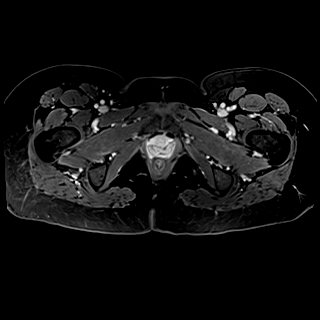
[im 51/176]
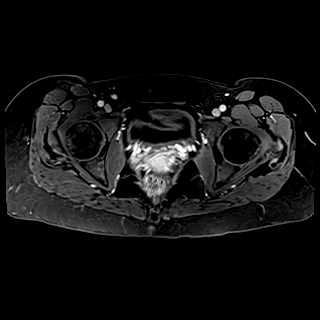
[im 76/176]
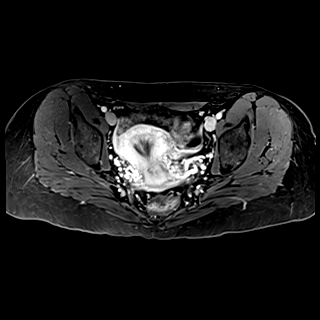
[im 101/176]
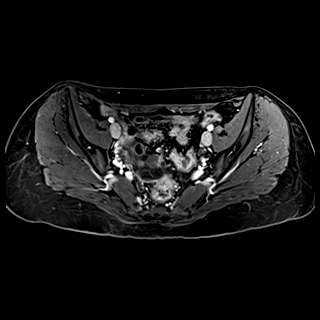
[im 126/176]
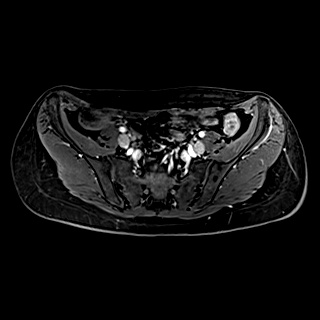
[im 151/176]
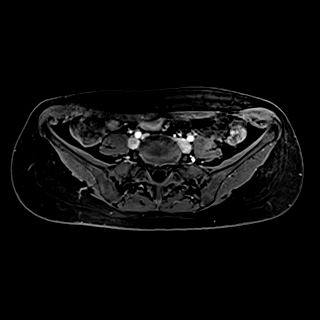
[im 176/176]
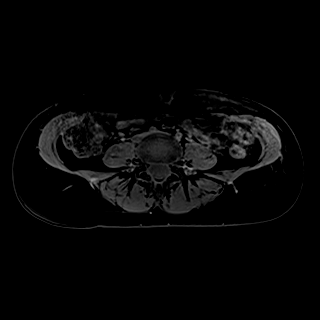

[Series 9: T1 fat-sat post-contrast · axial · 1.2mm · 1.09mm/px · z∈[-142,-112]mm · 2 of 176 slices shown (2 of 2)]
[im 1/176]
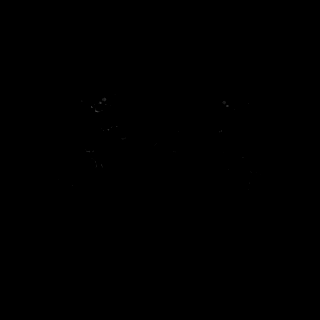
[im 26/176]
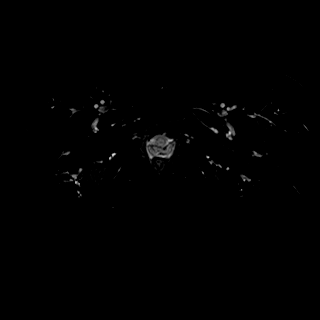

[34 of 48 positions shown; findings below may reference images not displayed]

FINDINGS: Urinary Tract: Normal urinary bladder.  No distal hydroureter.

Bowel: The rectum and sigmoid are underdistended. Wall thickening
within could be secondary or even related to muscular hypertrophy in
the setting of diverticulosis. Example asymmetric right-sided
rectosigmoid wall thickening including on 70/7. Normal small bowel
loops.

Vascular/Lymphatic:  No pelvic aneurysm or sidewall adenopathy.

Reproductive:

Uterus: Measures 7.2 x 4.4 x 5.1 cm. Normal endometrium and
junctional zone. Normal T2 hypointense cervical stroma.

Right ovary: Measures on the order of 3.0 x 2.7 cm on [DATE].
Demonstrates a complex T2 hypointense, mildly T1 hyperintense lesion
within, including at 1.4 x 1.2 cm on [DATE].

Left ovary: Measures 2.7 x 2.0 cm, including on [DATE]. Small
follicles within.

Other: Within the posterior right pelvis, adjacent or exophytic off
the medial right ovary are multiple cystic lesions. These measure
maximally 1.7 cm, including on [DATE]. On fat saturated T1 weighted
imaging, some of the smaller lesions are T1 hyperintense, including
on 81/7. The cystic lesions are within small volume cul-de-sac
fluid. There is subtle T1 hyperintensity within the dependent
cul-de-sac, lining the peritoneal surface. Example 76/7.

Musculoskeletal:  No acute osseous abnormality.
IMPRESSION: 1. Right ovarian and paraovarian posterior pelvis cystic foci, many
of which are T1 hyperintense, indicative of hemorrhage. Concurrent
subtle T1 hyperintense foci along the peritoneal surface within the
posterior cul-de-sac. Constellation of findings are suspicious for
endometriosis. Consider laparoscopic correlation.
2. Wall thickening within the rectum and sigmoid, possibly
artifactual due to underdistention. Consider correlation with
colonoscopy, which is being scheduled per EMR.

## 2020-01-03 MED ORDER — GADOBENATE DIMEGLUMINE 529 MG/ML IV SOLN
10.0000 mL | Freq: Once | INTRAVENOUS | Status: AC | PRN
  Administered 2020-01-03: 10 mL via INTRAVENOUS

## 2020-01-12 ENCOUNTER — Telehealth: Payer: Self-pay | Admitting: Internal Medicine

## 2020-01-12 NOTE — Telephone Encounter (Signed)
I have reviewed the MRI pelvis We can discuss this when I see her in Sept The scan does show thickening in the rectosigmoid colon where I also saw nodularity.  I think this is due to involvement of the colon with endometriosis. We will discuss more at OV

## 2020-01-12 NOTE — Telephone Encounter (Signed)
Pt calling wanting to let Dr. Rhea Belton know that her MRI results are in epic. She wanted Dr. Terri Piedra to see these prior to her OV.

## 2020-01-29 ENCOUNTER — Other Ambulatory Visit (INDEPENDENT_AMBULATORY_CARE_PROVIDER_SITE_OTHER): Payer: No Typology Code available for payment source

## 2020-01-29 ENCOUNTER — Encounter: Payer: Self-pay | Admitting: Internal Medicine

## 2020-01-29 ENCOUNTER — Ambulatory Visit: Payer: No Typology Code available for payment source | Admitting: Internal Medicine

## 2020-01-29 VITALS — BP 94/60 | HR 82 | Ht 66.0 in | Wt 128.0 lb

## 2020-01-29 DIAGNOSIS — R194 Change in bowel habit: Secondary | ICD-10-CM | POA: Diagnosis not present

## 2020-01-29 DIAGNOSIS — R1030 Lower abdominal pain, unspecified: Secondary | ICD-10-CM

## 2020-01-29 DIAGNOSIS — N809 Endometriosis, unspecified: Secondary | ICD-10-CM | POA: Diagnosis not present

## 2020-01-29 LAB — IGA: IgA: 91 mg/dL (ref 68–378)

## 2020-01-29 LAB — FERRITIN: Ferritin: 12.6 ng/mL (ref 10.0–291.0)

## 2020-01-29 MED ORDER — DICYCLOMINE HCL 20 MG PO TABS: 20.0000 mg | ORAL_TABLET | Freq: Three times a day (TID) | ORAL | 2 refills | Status: DC | PRN

## 2020-01-29 NOTE — Progress Notes (Signed)
Subjective:    Patient ID: Yvonne Garcia, female    DOB: 06/27/1985, 34 y.o.   MRN: [id number]  HPI Yvonne Garcia is a 34 year old female with ongoing though intermittent lower abdominal pain, loose stools, altered bowel habits, nausea, probable endometriosis who is seen for follow-up.  She was seen initially by Willette Cluster, NP on 10/21/2019 and set up for colonoscopy which I performed on 10/26/2019.  She is here alone today.  I performed colonoscopy on 10/26/2019.  The terminal ileum was normal.  There was an area of nodular mucosa in the distal sigmoid colon over an approximate 3 to 4 cm length of sigmoid.  This involves maybe a quarter of the luminal circumference.  It did not appear typically like colitis nor adenomatous polyp.  I question whether this could be a focus of endometriosis.  Internal hemorrhoids were found during retroflexion.  Biopsies showed benign polypoid mucosa without malignancy.  She subsequently had an MRI pelvis ordered by her gynecologist Dr. Renaldo Fiddler.  This showed right ovarian and paraovarian posterior pelvis cystic foci indicative of hemorrhage.  There was also hyperintense foci along the peritoneal surface within the posterior cul-de-sac.  These constellation of findings are suspicious for endometriosis.  There was wall thickening within the rectum and sigmoid possibly artifactual due to underdistention.  She reports that she continues to have lower abdominal pain with loose stools and abdominal cramps.  This occurs for about 3 weeks a month and starts a few days before her menstrual cycle, continues through her menstrual cycle and for about a week after.  She has 1 week/month which is normal.  During this week she does not have abdominal pain and bowel habits are more normal though at times she can feel slightly constipated.  She does have frequent nausea during the periods described above.  She will have other times where she is very hungry and feels that she will overeat.  She  has lost weight slowly.  She is not having vomiting.  No fevers or chills.  She has been referred to an endometrial specialist at [facility] within the department of minimally invasive surgery.   Review of Systems As per HPI, otherwise negative  Current Medications, Allergies, Past Medical History, Past Surgical History, Family History and Social History were reviewed in [employer] record.     Objective:   Physical Exam Blood pressure 94/60, pulse 82, height 5\' 6"  (1.676 m), weight 128 lb (58.1 kg), last menstrual period 12/31/2019, SpO2 97 %. Gen: awake, alert, NAD HEENT: anicteric Abd: soft, thin, NT/ND, +BS throughout Ext: no c/c/e Neuro: nonfocal  MRI PELVIS WITHOUT AND WITH CONTRAST   TECHNIQUE: Multiplanar multisequence MR imaging of the pelvis was performed both before and after administration of intravenous contrast.   CONTRAST:  64mL MULTIHANCE GADOBENATE DIMEGLUMINE 529 MG/ML IV SOLN   COMPARISON:  10/21/2019 clinic note   FINDINGS: Urinary Tract: Normal urinary bladder.  No distal hydroureter.   Bowel: The rectum and sigmoid are underdistended. Wall thickening within could be secondary or even related to muscular hypertrophy in the setting of diverticulosis. Example asymmetric right-sided rectosigmoid wall thickening including on 70/7. Normal small bowel loops.   Vascular/Lymphatic:  No pelvic aneurysm or sidewall adenopathy.   Reproductive:   Uterus: Measures 7.2 x 4.4 x 5.1 cm. Normal endometrium and junctional zone. Normal T2 hypointense cervical stroma.   Right ovary: Measures on the order of 3.0 x 2.7 cm on 16/5. Demonstrates a complex T2 hypointense, mildly  T1 hyperintense lesion within, including at 1.4 x 1.2 cm on 14/4.   Left ovary: Measures 2.7 x 2.0 cm, including on 19/5. Small follicles within.   Other: Within the posterior right pelvis, adjacent or exophytic off the medial right ovary are multiple cystic  lesions. These measure maximally 1.7 cm, including on 17/8. On fat saturated T1 weighted imaging, some of the smaller lesions are T1 hyperintense, including on 81/7. The cystic lesions are within small volume cul-de-sac fluid. There is subtle T1 hyperintensity within the dependent cul-de-sac, lining the peritoneal surface. Example 76/7.   Musculoskeletal:  No acute osseous abnormality.   IMPRESSION: 1. Right ovarian and paraovarian posterior pelvis cystic foci, many of which are T1 hyperintense, indicative of hemorrhage. Concurrent subtle T1 hyperintense foci along the peritoneal surface within the posterior cul-de-sac. Constellation of findings are suspicious for endometriosis. Consider laparoscopic correlation. 2. Wall thickening within the rectum and sigmoid, possibly artifactual due to underdistention. Consider correlation with colonoscopy, which is being scheduled per EMR.     Electronically Signed   By: Jeronimo Greaves M.D.   On: 01/03/2020 16:58      Assessment & Plan:  34 year old female with ongoing though intermittent lower abdominal pain, loose stools, altered bowel habits, nausea, probable endometriosis who is seen for follow-up.   1.  Altered bowel function/lower abdominal pain and loose stools/abnormal short segment mucosa of the rectosigmoid colon/abnormal MRI pelvis --all of her symptoms are felt secondary to endometriosis.  I am very highly suspicious that the abnormal segment of rectosigmoid colon corresponds to the abnormality seen by MRI pelvis all of which is felt to be secondary to serosal involvement by endometriosis.  Her symptoms are cyclical in nature also fitting with endometriosis.  We spent time today discussing this diagnosis, her symptoms as well as answering a number of questions which she brings today.  She will be seeing an endometriosis specialist and I expect that she will need laparoscopy with ablation/resection of endometrial implants.  Once treated I  would expect she would be placed on hormonal therapy to control future endometriosis and once treated I would expect her bowel habits to improve dramatically. --Check celiac panel today --Check iron studies today --Follow-up as needed after seeing endometrial expert at [facility] --Trial of Bentyl 20 mg 3 times daily as needed for crampy abdominal pain --I am certainly happy to see her in the future and asked that she reach out to me if she has further questions or concerns   30 minutes total spent today including patient facing time, coordination of care, reviewing medical history/procedures/pertinent radiology studies, and documentation of the encounter.

## 2020-01-29 NOTE — Patient Instructions (Signed)
Your provider has requested that you go to the basement level for lab work before leaving today. Press "B" on the elevator. The lab is located at the first door on the left as you exit the elevator.  We have sent the following medications to your pharmacy for you to pick up at your convenience: Bentyl 20 mg three times daily as needed for cramping/abdominal pain  Follow as needed with Dr Rhea Belton.  If you are age 36 or older, your body mass index should be between 23-30. Your Body mass index is 20.66 kg/m. If this is out of the aforementioned range listed, please consider follow up with your Primary Care Provider.  If you are age 42 or younger, your body mass index should be between 19-25. Your Body mass index is 20.66 kg/m. If this is out of the aformentioned range listed, please consider follow up with your Primary Care Provider.   Due to recent changes in healthcare laws, you may see the results of your imaging and laboratory studies on MyChart before your provider has had a chance to review them.  We understand that in some cases there may be results that are confusing or concerning to you. Not all laboratory results come back in the same time frame and the provider may be waiting for multiple results in order to interpret others.  Please give [place] 48 hours in order for your provider to thoroughly review all the results before contacting the office for clarification of your results.

## 2020-02-01 LAB — TISSUE TRANSGLUTAMINASE, IGA: (tTG) Ab, IgA: 1 U/mL

## 2020-03-14 NOTE — Telephone Encounter (Signed)
Please let Keayra know that I was able to review the lab work which she attached to her email  I agree completely with the high-dose vitamin D supplementation and she can follow-up with Dr. Wynelle Link to ensure vitamin D levels normalize after high-dose vitamin D  Regarding B12 deficiency, I would recommend that we have her come for antiparietal cell and antiintrinsic factor antibody testing.  If either of these are positive this would indicate pernicious anemia and I would recommend IM B12 therapy.  Patients with pernicious anemia are unlikely to normalize B12 levels with just oral supplementation.  Given that her B12 was so low, I anticipate she will need IM therapy but we will await these tests.  Regarding ferritin, if she has been on oral iron as recommended and ferritin remains low, I think considering IV iron is very reasonable.  She is not anemic which is good, but if iron remains low she is certainly at risk for anemia. I certainly do not want to interfere with Dr. Chase Caller recommendation however if she is interested we could refer her to see Dr. Leonides Schanz with hematology to get his opinion regarding IV iron therapy.  Thanks [employer]

## 2020-03-15 ENCOUNTER — Other Ambulatory Visit: Payer: Self-pay

## 2020-03-15 ENCOUNTER — Other Ambulatory Visit: Payer: No Typology Code available for payment source

## 2020-03-15 ENCOUNTER — Other Ambulatory Visit: Payer: Self-pay | Admitting: Internal Medicine

## 2020-03-15 DIAGNOSIS — D649 Anemia, unspecified: Secondary | ICD-10-CM

## 2020-03-20 LAB — ANTI-PARIETAL ANTIBODY: PARIETAL CELL AB SCREEN: NEGATIVE

## 2020-03-20 LAB — INTRINSIC FACTOR ANTIBODIES: Intrinsic Factor: NEGATIVE

## 2020-03-23 ENCOUNTER — Other Ambulatory Visit: Payer: Self-pay

## 2020-03-23 DIAGNOSIS — D649 Anemia, unspecified: Secondary | ICD-10-CM

## 2020-03-28 ENCOUNTER — Telehealth: Payer: Self-pay | Admitting: Physician Assistant

## 2020-03-28 NOTE — Telephone Encounter (Signed)
Received a new hem referral from Dr. Rhea Belton for anemia. Yvonne Garcia returned my call and has been scheduled to see [name] on 11/24 at 130pm w/labs at 1pm. Pt aware to arrive 15 minutes early.

## 2020-04-12 ENCOUNTER — Other Ambulatory Visit: Payer: Self-pay | Admitting: Physician Assistant

## 2020-04-12 DIAGNOSIS — D509 Iron deficiency anemia, unspecified: Secondary | ICD-10-CM

## 2020-04-12 NOTE — Progress Notes (Signed)
[place] [phone]   [phone]  CONSULT NOTE  REFERRING PHYSICIAN: Dr. Hilarie Fredrickson  REASON FOR CONSULTATION:  Iron Deficiency   HPI Yvonne Garcia is a 34 y.o. female with a past medical history significant for probable endometriosis, iron deficiency, and B12 deficiency was referred to the clinic for evaluation of iron deficiency.  The patient was first told she was iron deficiency in June 2021 at her OBGYN's office. She started taking OTC iron supplements with 25 mg of iron in August 2021 and and has been complaint with this since that time.   The patient was seen by gastroenterology on 01/29/2020.  At that visit, the patient was endorsing intermittent lower abdominal pain, loose stools, altered bowel habits, and nausea.  The patient had a colonoscopy on 10/26/2019 which showed nodular mucosa in the distal sigmoid colon approximately 3 to 4 cm in length.  The patient's gastroenterologist, Dr. Hilarie Fredrickson, noted that this did not appear typical for colitis or adenomatous polyps.  He believes it may be a focus of endometriosis.  The patient is also followed by her gynecologist, Dr. Orvan Seen.  The patient had a pelvic MRI performed and the findings were suspicious for endometriosis.  The patient saw an  endometriosis specialist at [facility] and in [place] who are planning on performing a hysterectomy and possible bowel resection.   While the patient was being evaluated at gastroenterology, she had routine blood work performed including a celiac panel, iron studies, as well as antibody testing for pernicious anemia due to B12 deficiency.  The patient's work-up for pernicious anemia was within normal limits. Her B12 was low at 91 which was drawn on 03/08/20. She has been taking B12 supplements for about 1 month and had this rechecked a few days ago which was 790.  The patient's iron studies demonstrated a ferritin in the low end of normal at 12.6 despite the patient  taking oral iron supplements.  The patient celiac panel was negative.  The patient was referred to the clinic today for consideration of IV iron due to the persistently low ferritin despite taking oral iron supplements.  The patient's lab work does not show anemia with the iron deficiency. No records for comparison are available to me.   Overall, the patient is feeling fatigued. She reports low energy, brain fog, and lightheadedness for a few months. She was disapointed that her B12 levels were within normal limits at her last lab draw because she did not notice an improvement in her energy levels. The patient denies any abnormal bleeding or bruising including epistaxis, gingival bleeding, hemoptysis, hematemesis, melena, hematochezia, easy bruising, or hematuria.  She has menstrual periods every 4 weeks.  She states that her menstrual periods last 4 days days.  She estimates that she uses 2 large cloth pads per day during the first two days of her period when it is the heaviest.  She denies any particular dietary habits such as being a vegan or vegetarian; however, she does eat mostly eggs, beans, whole grains, fruits, and veggies. She has red meat 1x per week due to patient preference. However, she does drink kale and spinach smoothies frequently to increase her iron intake. The patient denies any blood thinner use or NSAID use. Denies gastric surgery. She reports she lost 20 lbs unipotential since February 2021.   The patient is concerned about malignancy since she has an extensive family history of malignancy. Her mother was diagnosed with breast and pancreatitic  cancer in her 71's. Her mother also has MS. The patient's father had renal cell carcinoma as well as her paternal granfather. Her maternal grandfather had CRC. She denies family history of thalassemia or sickle cell anemia.   The patient is married. She is an Marine scientist. She does not have any children. She denies drugs, tobacco, or alcohol use.    HPI  Past Medical History:  Diagnosis Date  . Allergies   . Anxiety   . Internal hemorrhoids     Past Surgical History:  Procedure Laterality Date  . TONSILLECTOMY    . WISDOM TOOTH EXTRACTION      Family History  Problem Relation Age of Onset  . Mental illness Mother        suicide.   . Pancreatic cancer Mother   . Breast cancer Mother   . Kidney cancer Father   . Colon cancer Maternal Grandfather        age 71s  . Kidney cancer Paternal Grandfather     Social History Social History   Tobacco Use  . Smoking status: Never Smoker  . Smokeless tobacco: Never Used  Vaping Use  . Vaping Use: Never used  Substance Use Topics  . Alcohol use: Yes    Comment: rare  . Drug use: Never    No Known Allergies  Current Outpatient Medications  Medication Sig Dispense Refill  . clonazePAM (KLONOPIN) 0.5 MG tablet Take 1 tablet by mouth as needed.    . dicyclomine (BENTYL) 20 MG tablet Take 1 tablet (20 mg total) by mouth 3 (three) times daily as needed for spasms. 90 tablet 2  . Ferrous Sulfate (IRON PO) Take 1 each by mouth daily as needed.    . fluticasone (FLONASE ALLERGY RELIEF) 50 MCG/ACT nasal spray Flonase    . SUTAB [phone] MG TABS TAKE 1 KIT BY MOUTH ONCE AS DIRECTED    . Vitamin D, Ergocalciferol, (DRISDOL) 1.25 MG (50000 UNIT) CAPS capsule Take by mouth.     No current facility-administered medications for this visit.     REVIEW OF SYSTEMS:   Review of Systems  Constitutional: Positive for fatigue and weight loss. Negative for appetite change, chills, and fever.  HENT: Negative for mouth sores, nosebleeds, sore throat and trouble swallowing.   Eyes: Negative for eye problems and icterus.  Respiratory: Negative for cough, hemoptysis, shortness of breath and wheezing.   Cardiovascular: Negative for chest pain and leg swelling.  Gastrointestinal: Positive for lower abdominal pain, nausea, and loose stools. Negative for constipation and vomiting.   Genitourinary: Negative for bladder incontinence, difficulty urinating, dysuria, frequency and hematuria.   Musculoskeletal: Negative for back pain, gait problem, neck pain and neck stiffness.  Skin: Negative for itching and rash.  Neurological: Negative for dizziness, extremity weakness, gait problem, headaches, light-headedness and seizures.  Hematological: Negative for adenopathy. Does not bruise/bleed easily.  Psychiatric/Behavioral: Negative for confusion, depression and sleep disturbance. The patient is not nervous/anxious.     PHYSICAL EXAMINATION:  Blood pressure 107/83, pulse 77, temperature 99.8 F (37.7 C), resp. rate 12, height '5\' 6"'  (1.676 m), weight 125 lb 14.4 oz (57.1 kg), SpO2 100 %.  ECOG PERFORMANCE STATUS: 1  Physical Exam  Constitutional: Oriented to person, place, and time and well-developed, well-nourished, and in no distress. No distress.  HENT:  Head: Normocephalic and atraumatic.  Mouth/Throat: Oropharynx is clear and moist. No oropharyngeal exudate.  Eyes: Conjunctivae are normal. Right eye exhibits no discharge. Left eye exhibits no discharge. No scleral icterus.  Neck: Normal range of motion. Neck supple.  Cardiovascular: Normal rate, regular rhythm, normal heart sounds and intact distal pulses.   Pulmonary/Chest: Effort normal and breath sounds normal. No respiratory distress. No wheezes. No rales.  Abdominal: Soft. Bowel sounds are normal. Exhibits no distension and no mass. There is no tenderness.  Musculoskeletal: Normal range of motion. Exhibits no edema.  Lymphadenopathy:    No cervical adenopathy.  Neurological: Alert and oriented to person, place, and time. Exhibits normal muscle tone. Gait normal. Coordination normal.  Skin: Skin is warm and dry. No rash noted. Not diaphoretic. No erythema. No pallor.  Psychiatric: Mood, memory and judgment normal.  Vitals reviewed.  LABORATORY DATA: Lab Results  Component Value Date   WBC 7.5 04/13/2020    HGB 11.9 (L) 04/13/2020   HCT 35.2 (L) 04/13/2020   MCV 84.8 04/13/2020   PLT 233 04/13/2020      Chemistry      Component Value Date/Time   NA 138 04/13/2020 1255   K 3.6 04/13/2020 1255   CL 105 04/13/2020 1255   CO2 23 04/13/2020 1255   BUN 13 04/13/2020 1255   CREATININE 0.73 04/13/2020 1255      Component Value Date/Time   CALCIUM 9.1 04/13/2020 1255   ALKPHOS 53 04/13/2020 1255   AST 12 (L) 04/13/2020 1255   ALT 6 04/13/2020 1255   BILITOT 1.3 (H) 04/13/2020 1255       RADIOGRAPHIC STUDIES: No results found.  ASSESSMENT: This is a very pleasant 34 year old Caucasian female referred to the clinic for iron deficiency.   PLAN: The patient was seen with Dr. Julien Nordmann today.  The patient had a repeat CBC, CMP, iron studies, ferritin, Y30, and folic acid performed.  The patient's lab studies from today show her Hgb is slightly below normal at 11.9. Her CMP is unremarkable except her bilirubin is slightly high at 1.3. We will add on reticulocyte count, LDH, and haptoglobin to ensure no hemolysis.   Her iron studies are still pending. Dr. Julien Nordmann would like to wait for the results of her iron studies before determining if she requires an IV iron infusion. If her iron studies and ferritin are abnormal, we will arrange for iron infusions, likely with venofer weekly x4. If her iron studies are within normal limits, Dr. Julien Nordmann would recommend that she continue on her oral iron supplements.   I will call the patient later today with the results of her labs.   I will refer the patient to genetic counseling due to her extensive family history of malignancy and her concerns regarding her unusual abdominal symptoms the last few months and weight loss. She will continue to follow with gastroenterology. The patient also mentions concerns with feeling unwell after eating sweets and is concerned about her blood sugar. Encouraged her to discuss with her PCP. If he is concerned, he can consider  referral to endocrinology.   The patient voices understanding of current disease status and treatment options and is in agreement with the current care plan.  All questions were answered. The patient knows to call the clinic with any problems, questions or concerns. We can certainly see the patient much sooner if necessary.  Thank you so much for allowing me to participate in the care of [place]. I will continue to follow up the patient with you and assist in her care.  The total time spent in the appointment was 60 minutes.  Disclaimer: This note was dictated with voice recognition software.  Similar sounding words can inadvertently be transcribed and may not be corrected upon review.   Oasis Goehring L Vashon Riordan April 13, 2020, 2:27 PM  ADDENDUM: Hematology/Oncology Attending: I had a face-to-face encounter with the patient today.  I recommended her care plan.  This is a very pleasant 34 years old white female with history of endometriosis as well as history of iron and vitamin B12 deficiency.  She was recently seen by her gastroenterologist complaining of intermittent lower abdominal pain as well as loose stool and change in her bowel movement.  The patient did not have any significant abnormalities on her previous colonoscopy specifically no mass or colitis.  She is followed by OB/GYN for her endometriosis and she was started recently on oral iron tablets since August 2021.  She is compliant with her medication.  She was sent to [place] today for evaluation of her anemia and consideration of iron infusion if needed. Repeat CBC today showed normal iron study and ferritin in her hemoglobin is just below the normal range was 11.9. We order several other studies to rule any underlying etiology for her anemia.  We recommended for her to continue with the vitamin B12 supplements. I do not see a need to give the patient iron infusion especially with her normal iron values at this point. We will  also check hemolytic panel because of the slightly elevated serum bilirubin. I recommended for the patient to follow with her primary care physician, gastroenterologist and OB/GYN.  She has some hypoglycemic episodes especially after eating sweets and she may need further evaluation for this problem and consideration of referral to endocrinology to rule out underlying insulinoma if needed. The patient was advised to call if she has any concerning symptoms in the interval.  Disclaimer: This note was dictated with voice recognition software. Similar sounding words can inadvertently be transcribed and may be missed upon review. Eilleen Kempf, MD 04/13/20

## 2020-04-13 ENCOUNTER — Telehealth: Payer: Self-pay | Admitting: Physician Assistant

## 2020-04-13 ENCOUNTER — Other Ambulatory Visit: Payer: Self-pay

## 2020-04-13 ENCOUNTER — Inpatient Hospital Stay: Payer: No Typology Code available for payment source | Attending: Physician Assistant | Admitting: Physician Assistant

## 2020-04-13 ENCOUNTER — Inpatient Hospital Stay: Payer: No Typology Code available for payment source

## 2020-04-13 ENCOUNTER — Encounter: Payer: Self-pay | Admitting: Physician Assistant

## 2020-04-13 VITALS — BP 107/83 | HR 77 | Temp 99.8°F | Resp 12 | Ht 66.0 in | Wt 125.9 lb

## 2020-04-13 DIAGNOSIS — Z803 Family history of malignant neoplasm of breast: Secondary | ICD-10-CM | POA: Insufficient documentation

## 2020-04-13 DIAGNOSIS — Z809 Family history of malignant neoplasm, unspecified: Secondary | ICD-10-CM | POA: Diagnosis not present

## 2020-04-13 DIAGNOSIS — E538 Deficiency of other specified B group vitamins: Secondary | ICD-10-CM | POA: Diagnosis not present

## 2020-04-13 DIAGNOSIS — D509 Iron deficiency anemia, unspecified: Secondary | ICD-10-CM | POA: Insufficient documentation

## 2020-04-13 DIAGNOSIS — N809 Endometriosis, unspecified: Secondary | ICD-10-CM

## 2020-04-13 DIAGNOSIS — R5383 Other fatigue: Secondary | ICD-10-CM | POA: Diagnosis not present

## 2020-04-13 DIAGNOSIS — Z79899 Other long term (current) drug therapy: Secondary | ICD-10-CM | POA: Insufficient documentation

## 2020-04-13 LAB — CMP (CANCER CENTER ONLY)
ALT: 6 U/L (ref 0–44)
AST: 12 U/L — ABNORMAL LOW (ref 15–41)
Albumin: 4.4 g/dL (ref 3.5–5.0)
Alkaline Phosphatase: 53 U/L (ref 38–126)
Anion gap: 10 (ref 5–15)
BUN: 13 mg/dL (ref 6–20)
CO2: 23 mmol/L (ref 22–32)
Calcium: 9.1 mg/dL (ref 8.9–10.3)
Chloride: 105 mmol/L (ref 98–111)
Creatinine: 0.73 mg/dL (ref 0.44–1.00)
GFR, Estimated: >60 mL/min (ref >60)
Glucose, Bld: 87 mg/dL (ref 70–99)
Potassium: 3.6 mmol/L (ref 3.5–5.1)
Sodium: 138 mmol/L (ref 135–145)
Total Bilirubin: 1.3 mg/dL — ABNORMAL HIGH (ref 0.3–1.2)
Total Protein: 7.3 g/dL (ref 6.5–8.1)

## 2020-04-13 LAB — RETIC PANEL
Immature Retic Fract: 4.1 % (ref 2.3–15.9)
RBC.: 4.23 MIL/uL (ref 3.87–5.11)
Retic Count, Absolute: 31.7 10*3/uL (ref 19.0–186.0)
Retic Ct Pct: 0.8 % (ref 0.4–3.1)
Reticulocyte Hemoglobin: 34.4 pg (ref >27.9)

## 2020-04-13 LAB — IRON AND TIBC
Iron: 99 ug/dL (ref 41–142)
Saturation Ratios: 30 % (ref 21–57)
TIBC: 335 ug/dL (ref 236–444)
UIBC: 236 ug/dL (ref 120–384)

## 2020-04-13 LAB — CBC WITH DIFFERENTIAL (CANCER CENTER ONLY)
Abs Immature Granulocytes: 0.03 10*3/uL (ref 0.00–0.07)
Basophils Absolute: 0 10*3/uL (ref 0.0–0.1)
Basophils Relative: 0 %
Eosinophils Absolute: 0.1 10*3/uL (ref 0.0–0.5)
Eosinophils Relative: 1 %
HCT: 35.2 % — ABNORMAL LOW (ref 36.0–46.0)
Hemoglobin: 11.9 g/dL — ABNORMAL LOW (ref 12.0–15.0)
Immature Granulocytes: 0 %
Lymphocytes Relative: 39 %
Lymphs Abs: 2.9 10*3/uL (ref 0.7–4.0)
MCH: 28.7 pg (ref 26.0–34.0)
MCHC: 33.8 g/dL (ref 30.0–36.0)
MCV: 84.8 fL (ref 80.0–100.0)
Monocytes Absolute: 0.5 10*3/uL (ref 0.1–1.0)
Monocytes Relative: 7 %
Neutro Abs: 3.9 10*3/uL (ref 1.7–7.7)
Neutrophils Relative %: 53 %
Platelet Count: 233 10*3/uL (ref 150–400)
RBC: 4.15 MIL/uL (ref 3.87–5.11)
RDW: 13.5 % (ref 11.5–15.5)
WBC Count: 7.5 10*3/uL (ref 4.0–10.5)
nRBC: 0 % (ref 0.0–0.2)

## 2020-04-13 LAB — FOLATE: Folate: 16.4 ng/mL (ref >5.9)

## 2020-04-13 LAB — VITAMIN B12: Vitamin B-12: 554 pg/mL (ref 180–914)

## 2020-04-13 LAB — FERRITIN: Ferritin: 13 ng/mL (ref 11–307)

## 2020-04-13 LAB — LACTATE DEHYDROGENASE: LDH: 152 U/L (ref 98–192)

## 2020-04-13 NOTE — Telephone Encounter (Signed)
Reviewed the patient's iron studies with Dr. Arbutus Ped. Her ferritin is on the low end of normal. Her iron studies are WNL. Dr. Arbutus Ped would not recommend iron infusions with her lab work today. She was advised to continue taking oral iron supplements.

## 2020-04-14 LAB — HAPTOGLOBIN: Haptoglobin: 88 mg/dL (ref 33–278)

## 2020-04-19 ENCOUNTER — Telehealth: Payer: Self-pay | Admitting: Physician Assistant

## 2020-04-19 NOTE — Telephone Encounter (Signed)
Scheduled appt per 11/29 sch msg - pt is aware of appt date and time   

## 2020-04-22 ENCOUNTER — Ambulatory Visit: Payer: No Typology Code available for payment source | Attending: Internal Medicine

## 2020-04-22 DIAGNOSIS — Z23 Encounter for immunization: Secondary | ICD-10-CM

## 2020-04-22 NOTE — Progress Notes (Signed)
   Covid-19 Vaccination Clinic  Name:  Yvonne Garcia    MRN: [id number] DOB: 1985/07/25  04/22/2020  [name] was observed post Covid-19 immunization for 15 minutes without incident. She was provided with Vaccine Information Sheet and instruction to access the V-Safe system.   [name] was instructed to call 911 with any severe reactions post vaccine: Marland Kitchen Difficulty breathing  . Swelling of face and throat  . A fast heartbeat  . A bad rash all over body  . Dizziness and weakness   Immunizations Administered    Name Date Dose VIS Date Route   Pfizer COVID-19 Vaccine 04/22/2020  3:46 PM 0.3 mL 03/09/2020 Intramuscular   Manufacturer: [employer], [employer]   Lot: [id number]   NDC: [address]-2

## 2020-04-28 ENCOUNTER — Telehealth: Payer: Self-pay | Admitting: Internal Medicine

## 2020-04-28 NOTE — Telephone Encounter (Signed)
Pt states she has seen the surgeon and they are recommending a bowel resection. Pt wants to come in and talk to Dr. Rhea Belton before she decides what to do. Pt scheduled to see Dr. Rhea Belton 06/01/20 at 2:30pm. Pt aware of appt.

## 2020-04-28 NOTE — Telephone Encounter (Signed)
Pt is requesting a call back from a nurse to discuss some surgery recommendations she was offered, pt would like some advice from Dr Rhea Belton prior to having the surgeries, pt would like to discuss further with the nurse.

## 2020-05-05 ENCOUNTER — Inpatient Hospital Stay: Payer: No Typology Code available for payment source

## 2020-05-05 ENCOUNTER — Other Ambulatory Visit: Payer: Self-pay | Admitting: Genetic Counselor

## 2020-05-05 ENCOUNTER — Other Ambulatory Visit: Payer: Self-pay

## 2020-05-05 ENCOUNTER — Encounter: Payer: Self-pay | Admitting: Genetic Counselor

## 2020-05-05 ENCOUNTER — Inpatient Hospital Stay: Payer: No Typology Code available for payment source | Attending: Physician Assistant | Admitting: Genetic Counselor

## 2020-05-05 DIAGNOSIS — Z8051 Family history of malignant neoplasm of kidney: Secondary | ICD-10-CM

## 2020-05-05 DIAGNOSIS — Z803 Family history of malignant neoplasm of breast: Secondary | ICD-10-CM

## 2020-05-05 DIAGNOSIS — Z800 Family history of malignant neoplasm of digestive organs: Secondary | ICD-10-CM | POA: Diagnosis not present

## 2020-05-05 DIAGNOSIS — Z809 Family history of malignant neoplasm, unspecified: Secondary | ICD-10-CM

## 2020-05-05 HISTORY — DX: Family history of malignant neoplasm of digestive organs: Z80.0

## 2020-05-05 HISTORY — DX: Family history of malignant neoplasm of kidney: Z80.51

## 2020-05-05 HISTORY — DX: Family history of malignant neoplasm of breast: Z80.3

## 2020-05-05 LAB — GENETIC SCREENING ORDER

## 2020-05-05 NOTE — Progress Notes (Signed)
REFERRING PROVIDER: Heilingoetter, Tobe Sos, PA-C  PRIMARY PROVIDER:  Donald Prose, MD  PRIMARY REASON FOR VISIT:  1. Family history of pancreatic cancer   2. Family history of breast cancer   3. Family history of colon cancer   4. Family history of kidney cancer      HISTORY OF PRESENT ILLNESS:   Yvonne Garcia, a 34 y.o. female, was seen for a [place] cancer genetics consultation at the request of Cassandra Heilingoetter, PA-C due to a family history of cancer.  Yvonne Garcia presents to clinic today to discuss the possibility of a hereditary predisposition to cancer, to discuss genetic testing, and to further clarify her future cancer risks, as well as potential cancer risks for family members.   Yvonne Garcia is a 34 y.o. female with no personal history of cancer.     RISK FACTORS:  Menarche was at age 23.  Nulliparous.  OCP use for approximately 5-6 years.  Ovaries intact: yes.  Hysterectomy: no.  Menopausal status: premenopausal.  HRT use: 0 years. Colonoscopy: 10/2019; benign polypoid colorectal type mucosa with hyperplastic epithelial changes and lymphoid aggregates. Mammogram within the last year: no. Number of breast biopsies: 0. Up to date with pelvic exams: yes; most recent 06/2018 Any excessive radiation exposure in the past: no  Past Medical History:  Diagnosis Date  . Allergies   . Anxiety   . Family history of breast cancer 05/05/2020  . Family history of colon cancer 05/05/2020  . Family history of kidney cancer 05/05/2020  . Family history of pancreatic cancer 05/05/2020  . Internal hemorrhoids     Past Surgical History:  Procedure Laterality Date  . TONSILLECTOMY    . WISDOM TOOTH EXTRACTION      Social History   Socioeconomic History  . Marital status: Married    Spouse name: Not on file  . Number of children: Not on file  . Years of education: Not on file  . Highest education level: Not on file  Occupational History  . Not on file  Tobacco Use   . Smoking status: Never Smoker  . Smokeless tobacco: Never Used  Vaping Use  . Vaping Use: Never used  Substance and Sexual Activity  . Alcohol use: Yes    Comment: rare  . Drug use: Never  . Sexual activity: Not on file  Other Topics Concern  . Not on file  Social History Narrative  . Not on file   Social Determinants of Health   Financial Resource Strain: Not on file  Food Insecurity: Not on file  Transportation Needs: Not on file  Physical Activity: Not on file  Stress: Not on file  Social Connections: Not on file     FAMILY HISTORY:  We obtained a detailed, 4-generation family history.  Significant diagnoses are listed below: Family History  Problem Relation Age of Onset  . Pancreatic cancer Mother 71  . Breast cancer Mother 5  . Kidney cancer Father 50  . Colon cancer Maternal Grandfather        dx 90s  . Kidney cancer Paternal Grandfather     Yvonne Garcia has one sister, age 20, and one maternal half brother, age 59, both without a history of cancer.  Yvonne Garcia mother was diagnosed with pancreatic cancer at age 36 and breast cancer at age 45.  She passed away at age 28.  Yvonne Garcia maternal grandfather was diagnosed with colon cancer in his 46s and passed away at age 50.  No  other maternal family history of cancer was reported.  Yvonne Garcia father is 36 years old and has a history of kidney cancer diagnosed at age 89.  Yvonne Garcia paternal grandfather was diagnosed with kidney cancer at an unknown age. No other paternal family history of cancer was reported.   Yvonne Garcia is unaware of previous family history of genetic testing for hereditary cancer risks. Patient's maternal ancestors are of European descent, and paternal ancestors are of European/French descent. There is no reported Ashkenazi Jewish ancestry. There is no known consanguinity.  GENETIC COUNSELING ASSESSMENT: Yvonne Garcia is a 34 y.o. female with a family history of cancer which is somewhat suggestive of a  hereditary cancer syndrome and predisposition to cancer given the presence of rare and related cancers in the family. We, therefore, discussed and recommended the following at today's visit.   DISCUSSION: We discussed that 5 - 10% of cancer is hereditary, with most cases of hereditary pancreatic cancer associated with mutations in BRCA1/2.  There are other genes that can be associated with hereditary pancreatic, breast, and kidney cancer syndromes.  Type of cancer risk and level of risk are gene-specific.  We discussed that testing is beneficial for several reasons, including knowing about other cancer risks, identifying potential screening and risk-reduction options that may be appropriate, and to understanding if other family members could be at risk for cancer and allowing them to undergo genetic testing.  We reviewed the characteristics, features and inheritance patterns of hereditary cancer syndromes. We also discussed genetic testing, including the appropriate family members to test, the process of testing, insurance coverage and turn-around-time for results. We discussed the implications of a negative, positive, carrier and/or variant of uncertain significant result. We discussed that negative results would be uninformative given that Yvonne Garcia does not have a personal history of cancer. We recommended Yvonne Garcia pursue genetic testing for a panel that contains genes associated with pancreatic, breast, and kidney cancers.  The Multi-Cancer Panel with pancreatitis genes offered by [facility] includes sequencing and/or deletion duplication testing of the following 89 genes: AIP, ALK, APC, ATM, AXIN2,BAP1,  BARD1, BLM, BMPR1A, BRCA1, BRCA2, BRIP1, CASR, CDC73, CDH1, CDKN1B, CDKN1C, CDKN2A (p14ARF), CDKN2A (p16INK4a), CEBPA, CFTR, CHEK2, CPA1, CTNNA1, CTRC, DICER1, DIS3L2, EGFR (c.2369C>T, p.Thr790Met variant only), EPCAM (Deletion/duplication testing only), FH, FLCN, GATA2, GPC3, GREM1 (Promoter region  deletion/duplication testing only), HOXB13 (c.251G>A, p.Gly84Glu), HRAS, KIT, MAX, MEN1, MET, MITF (c.952G>A, p.Glu318Lys variant only), MLH1, MSH2, MSH3, MSH6, MUTYH, NBN, NF1, NF2, NTHL1, PALB2, PDGFRA, PHOX2B, PMS2, POLD1, POLE, POT1, PRKAR1A, PRSS1, PTCH1, PTEN, RAD50, RAD51C, RAD51D, RB1, RECQL4, RET, RNF43, RUNX1, SDHAF2, SDHA (sequence changes only), SDHB, SDHC, SDHD, SMAD4, SMARCA4, SMARCB1, SMARCE1, SPINK1, STK11, SUFU, TERC, TERT, TMEM127, TP53, TSC1, TSC2, VHL, WRN and WT1.   Based on Yvonne Garcia's family history of pancreatic cancer, she meets medical criteria for genetic testing. Despite that she meets criteria, she may still have an out of pocket cost. We discussed that if her out of pocket cost for testing is over $100, the laboratory will call and confirm whether she wants to proceed with testing.  If the out of pocket cost of testing is less than $100 she will be billed by the genetic testing laboratory.   We discussed that some people do not want to undergo genetic testing due to fear of genetic discrimination.  A federal law called the Genetic Information Non-Discrimination Act (GINA) of 2008 helps protect individuals against genetic discrimination based on their genetic test results.  It impacts both  health insurance and employment.  With health insurance, it protects against increased premiums, being kicked off insurance or being forced to take a test in order to be insured.  For employment it protects against hiring, firing and promoting decisions based on genetic test results.  GINA does not apply to those in the [employer], those who work for companies with less than 15 employees, and new life insurance or long-term disability insurance policies.  Health status due to a cancer diagnosis is not protected under GINA.  PLAN: After considering the risks, benefits, and limitations, Yvonne Garcia provided informed consent to pursue genetic testing and the blood sample was sent to [facility]  for analysis of the Multi-Cancer Panel+ pancreatitis genes. Results should be available within approximately 3 weeks' time, at which point they will be disclosed by telephone to Yvonne Garcia, as will any additional recommendations warranted by these results. Yvonne Garcia will receive a summary of her genetic counseling visit and a copy of her results once available. This information will also be available in Epic.   Lastly, we encouraged Yvonne Garcia to remain in contact with cancer genetics annually so that we can continuously update the family history and inform her of any changes in cancer genetics and testing that may be of benefit for this family.   Ms. GREENFIELD questions were answered to her satisfaction today. Our contact information was provided should additional questions or concerns arise. Thank you for the referral and allowing [place] to share in the care of your patient.   Dillin Lofgren M. Joette Catching, [place], [facility] Genetic Counselor [email or web address] (P) [phone]   The patient was seen for a total of 30 minutes in face-to-face genetic counseling.  Drs. Magrinat, Lindi Adie and/or Burr Medico were available to discuss this case as needed.  _______________________________________________________________________ For Office Staff:  Number of people involved in session: 1 Was an Intern/ student involved with case: no

## 2020-05-05 NOTE — Telephone Encounter (Signed)
Please let Yvonne Garcia know that I have reviewed her recent hematology visit with Dr. Arbutus Ped. I also want to ensure that she is continuing her oral B12 1000 mcg daily. I would like her to return soon to have her B12 levels checked again Her antiintrinsic factor antibody was negative which argues against pernicious anemia. However given the low B12, I think it is reasonable to consider performing an upper endoscopy to evaluate the stomach.  B12 absorption begins in the stomach and ends in the last part of the small intestine. We can discuss this when I see her in January We can also consider repeat cross-sectional imaging if her abdominal symptoms remain incompletely explained. Thanks [employer]

## 2020-05-24 ENCOUNTER — Telehealth: Payer: Self-pay | Admitting: Genetic Counselor

## 2020-05-24 ENCOUNTER — Encounter: Payer: Self-pay | Admitting: Genetic Counselor

## 2020-05-24 DIAGNOSIS — Z1379 Encounter for other screening for genetic and chromosomal anomalies: Secondary | ICD-10-CM | POA: Insufficient documentation

## 2020-05-24 NOTE — Telephone Encounter (Signed)
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  Discussed that carrier status for a cystic fibrosis is a risk factor for pancreatitis and reproductive and family implications of being a carrier. Discussed why there is cancer in the family. It could be sporadic, due to a familial mutation she did not inherit, due to a different gene that we are not testing, or maybe our current technology may not be able to pick something up.  It will be important for her to keep in contact with genetics to keep up with whether additional testing may be needed.

## 2020-05-25 ENCOUNTER — Other Ambulatory Visit (INDEPENDENT_AMBULATORY_CARE_PROVIDER_SITE_OTHER): Payer: No Typology Code available for payment source

## 2020-05-25 DIAGNOSIS — D649 Anemia, unspecified: Secondary | ICD-10-CM | POA: Diagnosis not present

## 2020-05-25 LAB — VITAMIN B12: Vitamin B-12: 517 pg/mL (ref 211–911)

## 2020-06-01 ENCOUNTER — Ambulatory Visit: Payer: No Typology Code available for payment source | Admitting: Internal Medicine

## 2020-06-01 ENCOUNTER — Encounter: Payer: Self-pay | Admitting: Genetic Counselor

## 2020-06-01 ENCOUNTER — Encounter: Payer: Self-pay | Admitting: Internal Medicine

## 2020-06-01 ENCOUNTER — Ambulatory Visit: Payer: Self-pay | Admitting: Genetic Counselor

## 2020-06-01 VITALS — BP 98/64 | HR 70 | Ht 66.0 in | Wt 127.0 lb

## 2020-06-01 DIAGNOSIS — N80559 Endometriosis of other parts of the colon, unspecified depth: Secondary | ICD-10-CM

## 2020-06-01 DIAGNOSIS — E538 Deficiency of other specified B group vitamins: Secondary | ICD-10-CM | POA: Diagnosis not present

## 2020-06-01 DIAGNOSIS — D509 Iron deficiency anemia, unspecified: Secondary | ICD-10-CM

## 2020-06-01 DIAGNOSIS — Z8051 Family history of malignant neoplasm of kidney: Secondary | ICD-10-CM

## 2020-06-01 DIAGNOSIS — Z800 Family history of malignant neoplasm of digestive organs: Secondary | ICD-10-CM | POA: Diagnosis not present

## 2020-06-01 DIAGNOSIS — N809 Endometriosis, unspecified: Secondary | ICD-10-CM | POA: Diagnosis not present

## 2020-06-01 DIAGNOSIS — N805 Endometriosis of intestine: Secondary | ICD-10-CM

## 2020-06-01 DIAGNOSIS — Z803 Family history of malignant neoplasm of breast: Secondary | ICD-10-CM

## 2020-06-01 DIAGNOSIS — Z1379 Encounter for other screening for genetic and chromosomal anomalies: Secondary | ICD-10-CM

## 2020-06-01 NOTE — Patient Instructions (Signed)
You have been scheduled for a CT scan of the abdomen and pelvis with pancreatic protocol at [place] ([address]---this is in the same building as [employer]).   You are scheduled on 06/10/20 at 9:30 am. You should arrive 15 minutes prior to your appointment time for registration. Please follow the written instructions below on the day of your exam:  WARNING: IF YOU ARE ALLERGIC TO IODINE/X-RAY DYE, PLEASE NOTIFY RADIOLOGY IMMEDIATELY AT [phone]! YOU WILL BE GIVEN A 13 HOUR PREMEDICATION PREP.  1) Do not eat or drink anything after 5:30 am (4 hours prior to your test)  You may take any medications as prescribed with a small amount of water, if necessary. If you take any of the following medications: METFORMIN, GLUCOPHAGE, GLUCOVANCE, AVANDAMET, RIOMET, FORTAMET, [place] MET, JANUMET, GLUMETZA or METAGLIP, you MAY be asked to HOLD this medication 48 hours AFTER the exam.   Depending on your individual set of symptoms, you may also receive an intravenous injection of x-ray contrast/dye. Plan on being at [facility] for 30 minutes or longer, depending on the type of exam you are having performed.  This test typically takes 30-45 minutes to complete.  If you have any questions regarding your exam or if you need to reschedule, you may call the CT department at [phone] between the hours of 8:00 am and 5:00 pm, Monday-Friday.  __________________________________________________________  Continue B12.  Continue oral iron supplement.  Please follow up with Dr Hilarie Fredrickson in June 2022.  If you are age 39 or older, your body mass index should be between 23-30. Your Body mass index is 20.5 kg/m. If this is out of the aforementioned range listed, please consider follow up with your Primary Care Provider.  If you are age 31 or younger, your body mass index should be between 19-25. Your Body mass index is 20.5 kg/m. If this is out of the aformentioned range  listed, please consider follow up with your Primary Care Provider.   Due to recent changes in healthcare laws, you may see the results of your imaging and laboratory studies on MyChart before your provider has had a chance to review them.  We understand that in some cases there may be results that are confusing or concerning to you. Not all laboratory results come back in the same time frame and the provider may be waiting for multiple results in order to interpret others.  Please give [place] 48 hours in order for your provider to thoroughly review all the results before contacting the office for clarification of your results.

## 2020-06-01 NOTE — Progress Notes (Signed)
HPI:  Yvonne Garcia was previously seen in the [place] clinic due to a family history of cancer and concerns regarding a hereditary predisposition to cancer. Please refer to our prior cancer genetics clinic note for more information regarding our discussion, assessment and recommendations, at the time. Yvonne Garcia recent genetic test results were disclosed to her, as were recommendations warranted by these results. These results and recommendations are discussed in more detail below.  CANCER HISTORY:  Yvonne Garcia is a 35 y.o. female with no personal history of cancer.    FAMILY HISTORY:  We obtained a detailed, 4-generation family history.  Significant diagnoses are listed below: Family History  Problem Relation Age of Onset  . Pancreatic cancer Mother 26  . Breast cancer Mother 43  . Kidney cancer Father 52  . Colon cancer Maternal Grandfather        dx 57s  . Kidney cancer Paternal Grandfather        dx unknown age     Ms. Kagan has one sister, age 82, and one maternal half brother, age 90, both without a history of cancer.  Yvonne Garcia mother was diagnosed with pancreatic cancer at age 57 and breast cancer at age 87.  She passed away at age 32.  Yvonne Garcia maternal grandfather was diagnosed with colon cancer in his 70s and passed away at age 54.  No other maternal family history of cancer was reported.  Yvonne Garcia father is 56 years old and has a history of kidney cancer diagnosed at age 22.  Yvonne Garcia paternal grandfather was diagnosed with kidney cancer at an unknown age. No other paternal family history of cancer was reported.   Yvonne Garcia is unaware of previous family history of genetic testing for hereditary cancer risks. Patient's maternal ancestors are of European descent, and paternal ancestors are of European/French descent. There is no reported Ashkenazi Jewish ancestry. There is no known consanguinity.  GENETIC TEST RESULTS: At the time of Ms. Mcatee visit, we  recommended she pursue genetic testing of the Multi-Cancer + Pancreatitis Gene Panel. This test performed at [facility]. The Multi-Cancer Panel with pancreatitis genes and preliminary pancreatic cancer genes offered by [facility] includes sequencing and/or deletion duplication testing of the following 90 genes: AIP, ALK, APC, ATM, AXIN2,BAP1,  BARD1, BLM, BMPR1A, BRCA1, BRCA2, BRIP1, CASR, CDC73, CDH1, CDK4, CDKN1B, CDKN1C, CDKN2A (p14ARF), CDKN2A (p16INK4a), CEBPA, CFTR, CHEK2, CPA1, CTNNA1, CTRC, DICER1, DIS3L2, EGFR (c.2369C>T, p.Thr790Met variant only), EPCAM (Deletion/duplication testing only), FANCC, FH, FLCN, GATA2, GPC3, GREM1 (Promoter region deletion/duplication testing only), HOXB13 (c.251G>A, p.Gly84Glu), HRAS, KIT, MAX, MEN1, MET, MITF (c.952G>A, p.Glu318Lys variant only), MLH1, MSH2, MSH3, MSH6, MUTYH, NBN, NF1, NF2, NTHL1, PALB2, PALLD, PDGFRA, PHOX2B, PMS2, POLD1, POLE, POT1, PRKAR1A, PRSS1, PTCH1, PTEN, RAD50, RAD51C, RAD51D, RB1, RECQL4, RET, RNF43, RUNX1, SDHAF2, SDHA (sequence changes only), SDHB, SDHC, SDHD, SMAD4, SMARCA4, SMARCB1, SMARCE1, SPINK1, STK11, SUFU, TERC, TERT, TMEM127, TP53, TSC1, TSC2, VHL, WRN and WT1.    Ms. Riemann was called with her genetic test results.   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  Since Ms. Rief has only one mutation in CFTR, she is NOT affected with cystic fibrosis, but instead is a carrier.   A copy of the test report will be scanned into Epic under the Molecular Pathology section of the Results Review tab.     Ms. Crislip result does not explain her family history of cancer.  We discussed with Ms.  Pancoast that because current genetic testing is not perfect, it is possible there may be a gene mutation in one of these genes that current testing cannot detect, but that chance is small.  We also discussed that there could be another gene that has not yet  been discovered, or that we have not yet tested, that is responsible for the cancer diagnoses in the family. It is also possible there is a hereditary cause for the cancer in the family that Ms. Minnich did not inherit and therefore was not identified in her testing.  Therefore, it is important to remain in touch with cancer genetics in the future so that we can continue to offer Ms. Bayly the most up to date genetic testing.   Genetic testing did identify a variant of uncertain significance (VUS) was identified in the RECQL4 gene called c.2728G>A (p.Val910Ile).  At this time, it is unknown if this variant is associated with increased cancer risk or if this is a normal finding, but most variants such as this get reclassified to being inconsequential. It should not be used to make medical management decisions. With time, we suspect the lab will determine the significance of this variant, if any. If we do learn more about it, we will try to contact Ms. Frees to discuss it further. However, it is important to stay in touch with [place] periodically and keep the address and phone number up to date.  CFTR GENE:  We discussed that Yvonne Garcia genetic test revealed that she is a carrier of the c.1210-34TG[12]T[5] variant in the CFTR gene, associated with CFTR-related disorders.   We discussed that having two mutations in the CFTR gene can result in cystic fibrosis, an autosomal recessive condition that classically affects the lungs, pancreas, and digestive tract. In addition to classic cystic fibrosis, there are variant types of cystic fibrosis, called CFTR-related disorders (CFTR-RD), which have less severe symptoms including milder respiratory problems, female infertility, and/or acute recurrent or chronic pancreatitis. In some males, infertility caused by congenital bilateral absence of the vas deferens (CBAVD) may be the only clinical finding. Due to the autosomal recessive inheritance, two pathogenic variants (one on  each chromosome) are required to cause disease.  Because Yvonne Garcia only has one pathogenic variant, she is not affected with a CFTR-related disorder, but is instead a carrier. The risk for chronic pancreatitis can be increased in CFTR carriers. Pathogenic variants in CFTR may confer an approximately 4-10 fold increased risk for chronic pancreatitis in heterozygous carriers, although the absolute risk of pancreatitis remains low (less than 1 in 100).  Chronic pancreatitis is a risk factor for pancreatic cancer.   We also discussed how this result may impact other family members. If two parents are both carriers for cystic fibrosis, there is an increased chance of having a child with a cystic fibrosis-related disorder.  Prenatal and/or preconception genetic counseling is available for family members.   ADDITIONAL GENETIC TESTING: We discussed with Yvonne Garcia that her genetic testing was fairly extensive.  If there are genes identified to increase cancer risk that can be analyzed in the future, we would be happy to discuss and coordinate this testing at that time.    CANCER SCREENING RECOMMENDATIONS: Yvonne Garcia test result is considered normal.  This means that we have not identified a hereditary cause for her family history of cancer at this time. Most cancers happen by chance and this negative test suggests that her family's cancer may fall into this category.  While reassuring, this does not definitively rule out a hereditary predisposition to cancer. It is still possible that there could be genetic mutations that are undetectable by current technology. There could be genetic mutations in genes that have not been tested or identified to increase cancer risk.  Therefore, it is recommended she continue to follow the cancer management and screening guidelines provided by her primary healthcare provider.   An individual's cancer risk and medical management are not determined by genetic test results alone.  Overall cancer risk assessment incorporates additional factors, including personal medical history, family history, and any available genetic information that may result in a personalized plan for cancer prevention and surveillance  RECOMMENDATIONS FOR FAMILY MEMBERS:  Individuals in this family might be at some increased risk of developing cancer, over the general population risk, simply due to the family history of cancer.  We recommended women in this family have a yearly mammogram beginning at age 17, or 65 years younger than the earliest onset of cancer, an annual clinical breast exam, and perform monthly breast self-exams. Women in this family should also have a gynecological exam as recommended by their primary provider. All family members should be referred for colonoscopy starting at age 11.  It is also possible there is a hereditary cause for the cancer in Ms. Dickenson's family that she did not inherit and therefore was not identified in her.  Based on Yvonne Garcia's family history, we recommended first degree relatives of her mother have genetic counseling and testing. Yvonne Garcia will let us know if we can be of any assistance in coordinating genetic counseling and/or testing for this family member.   FOLLOW-UP: Lastly, we discussed with Yvonne Garcia that cancer genetics is a rapidly advancing field and it is possible that new genetic tests will be appropriate for her and/or her family members in the future. We encouraged her to remain in contact with cancer genetics on an annual basis so we can update her personal and family histories and let her know of advances in cancer genetics that may benefit this family.   Our contact number was provided. Yvonne Garcia questions were answered to her satisfaction, and she knows she is welcome to call us at anytime with additional questions or concerns.   Zylah Elsbernd M. Joette Catching, [place], [facility] Genetic Counselor [email or web address] (P) [phone]

## 2020-06-02 ENCOUNTER — Encounter: Payer: Self-pay | Admitting: Internal Medicine

## 2020-06-02 NOTE — Progress Notes (Signed)
Subjective:    Patient ID: Yvonne Garcia, female    DOB: Oct 02, 1985, 35 y.o.   MRN: [id number]  HPI Christopher Hink is a 35 year old female with a history of endometriosis with distal colonic involvement, B12 deficiency, IDA, family history of pancreatic cancer in her mother around age 51 who is seen for follow-up. She is here alone today. I last saw her in the office in September 2021.  She reports that recently she has been feeling fairly well. She has decided to have her endometrial surgery and partial bowel resection due to endometriosis involving the sigmoid colon in [place] with Dr. Zadie Cleverly. Dr. Lona Millard will be participating in performing the colon portion of the surgery. This is currently scheduled for 08/09/2020.  From an abdominal standpoint she reports that she has regular bowel movements outside of her menstrual cycle when her bowel movements can become loose and more frequent. No blood in stool or melena. When symptoms are troubling her she has lower abdominal cramping discomfort at times. Bowel movements have been somewhat better since she started back on her high-fiber fruit smoothies. When she is not on her menstrual cycle her bowel movements are daily. At times she feels some more mid to slightly left of midline abdominal pain which seems to come and go. She reports it is not consistent and not severe. Does not seem to relate to eating.  She expresses some concern about lower blood sugars particularly after she eats sweets. A few hours after doing so she feels anxious and irritable. She is also noticed that her blood pressure has been lower than normal for her. She has some dizziness with standing at times but no syncope. This is more concerning because she recalls prior to her mother's diagnosis with pancreas cancer she had similar low blood pressure and presyncopal symptoms.  She has been adherent to oral iron and B12 therapy.  She has not tried the dicyclomine but does have a  prescription for   Review of Systems As per HPI, was negative  Current Medications, Allergies, Past Medical History, Past Surgical History, Family History and Social History were reviewed in [employer] record.     Objective:   Physical Exam BP 98/64 (BP Location: Right Arm, Patient Position: Sitting, Cuff Size: Normal)   Pulse 70   Ht 5\' 6"  (1.676 m)   Wt 127 lb (57.6 kg)   BMI 20.50 kg/m  Gen: awake, alert, NAD HEENT: anicteric  Ext: no c/c/e Neuro: nonfocal  Lab Results  Component Value Date   VITAMINB12 517 05/25/2020   Antiparietal cell and intrinsic factor antibody negative TTG normal, IgA normal  Iron/TIBC/Ferritin/ %Sat    Component Value Date/Time   IRON 99 04/13/2020 1255   TIBC 335 04/13/2020 1255   FERRITIN 13 04/13/2020 1255   IRONPCTSAT 30 04/13/2020 1255        Assessment & Plan:  35 year old female with a history of endometriosis with distal colonic involvement, B12 deficiency, IDA, family history of pancreatic cancer in her mother around age 51 who is seen for follow-up  1. Endometriosis involving the sigmoid colon/intermittent loose stools and lower abdominal discomfort --her colonic symptoms in my opinion are definitively related to involvement with endometriosis. I expect this will improve and resolve after surgical treatment for endometriosis and segmental sigmoid resection planned in March 2022. We discussed this again today and I will see her in June of this year. -- Continue with plans for endometrial surgery as above --  Follow-up with me in June 2022 -- Can try the Bentyl 20 mg 3 times daily as needed for crampy abdominal pain  2. B12 deficiency --no evidence for pernicious anemia given the lack of antiparietal and antiintrinsic factor antibodies. She has responded completely to oral B12 therapy which I would not expect if she did not have intrinsic factor. We discussed this today. -- Continue oral B12 1000 mcg  daily  3. IDA -- felt secondary to menstruation. She has responded to oral iron with normalization of blood counts and iron studies. She will continue oral iron but may be able to be without this after her surgery  4. Family history of pancreas cancer --in her mother at age 62. She worries though not excessively, about her pancreas. We will perform CT scan abdomen pelvis for screening given family history -- CT scan abdomen pelvis with IV contrast; attention to the terminal ileum given B12 deficiency  40 minutes total spent today including patient facing time, coordination of care, reviewing medical history/procedures/pertinent radiology studies, and documentation of the encounter.

## 2020-06-10 ENCOUNTER — Ambulatory Visit (INDEPENDENT_AMBULATORY_CARE_PROVIDER_SITE_OTHER)
Admission: RE | Admit: 2020-06-10 | Discharge: 2020-06-10 | Disposition: A | Discharge status: Home / Self Care | Payer: No Typology Code available for payment source | Source: Ambulatory Visit | Attending: Internal Medicine | Admitting: Internal Medicine

## 2020-06-10 ENCOUNTER — Other Ambulatory Visit: Payer: Self-pay

## 2020-06-10 DIAGNOSIS — Z800 Family history of malignant neoplasm of digestive organs: Secondary | ICD-10-CM | POA: Diagnosis not present

## 2020-06-10 IMAGING — CT CT ABD-PEL WO/W CM
3 of 10 series · 12 of 46 positions shown, 18 images · IV contrast (OMNIPAQUE 300)
Comparison: None.

CLINICAL DATA: Family history of pancreas neoplasm.

EXAM:
CT ABDOMEN AND PELVIS WITHOUT AND WITH CONTRAST
TECHNIQUE: Multidetector CT imaging of the abdomen and pelvis was performed
following the standard protocol before and following the bolus
administration of intravenous contrast.
CONTRAST:  100mL OMNIPAQUE IOHEXOL 300 MG/ML  SOLN

[Series 4: arterial phase 3.0 br38 · axial · arterial · 0.64mm/px · z∈[-266,-236]mm · 2 of 77 slices shown]
[im 10/77  soft-tissue]
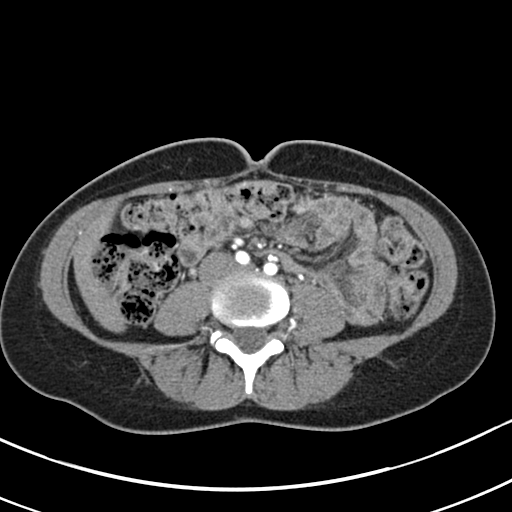
[im 20/77  soft-tissue]
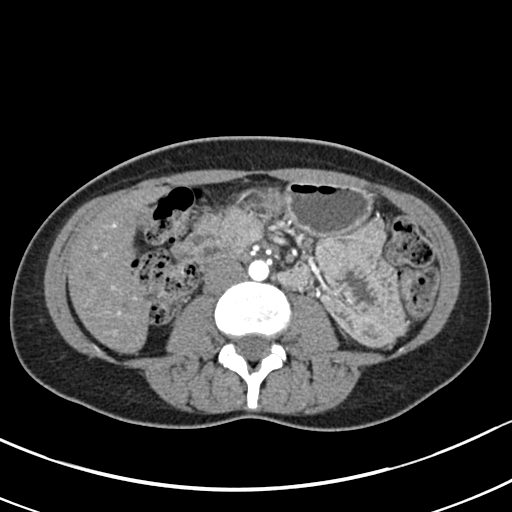

[Series 7: venous phase 5.0 br38 · axial · portal-venous · 0.64mm/px · z∈[-435,-120]mm · 7 of 85 slices shown, 12 images]
[im 11/85  soft-tissue]
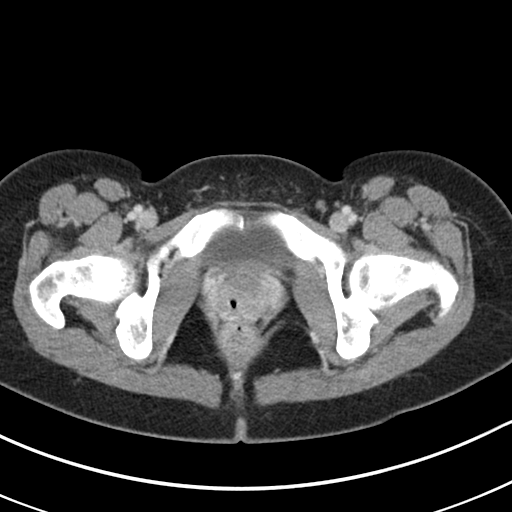
[im 11/85  bone]
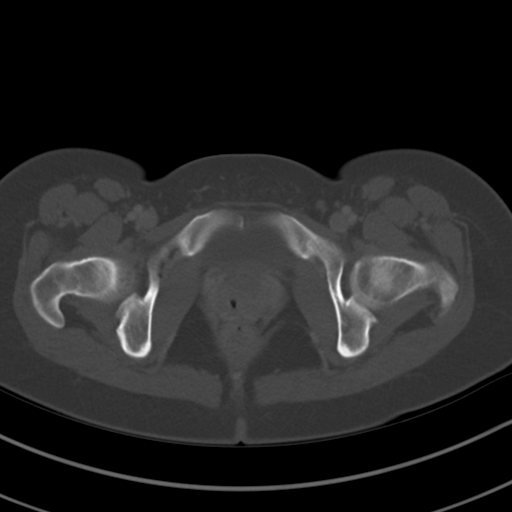
[im 22/85  soft-tissue]
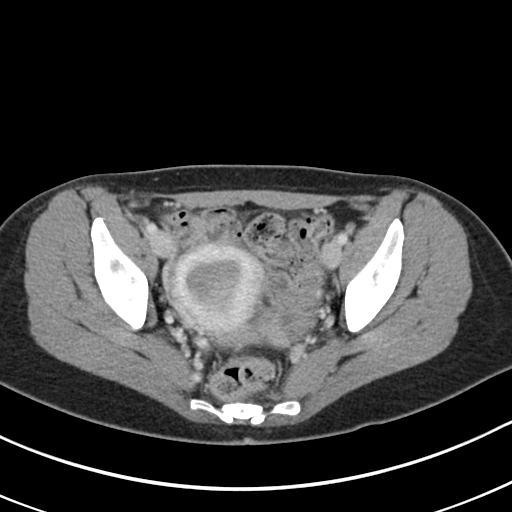
[im 32/85  soft-tissue]
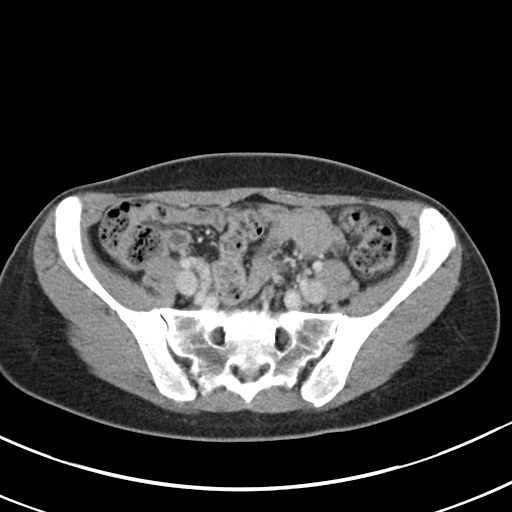
[im 43/85  soft-tissue]
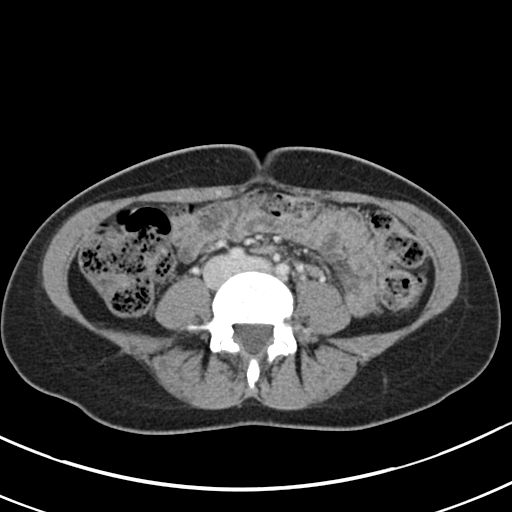
[im 43/85  lung]
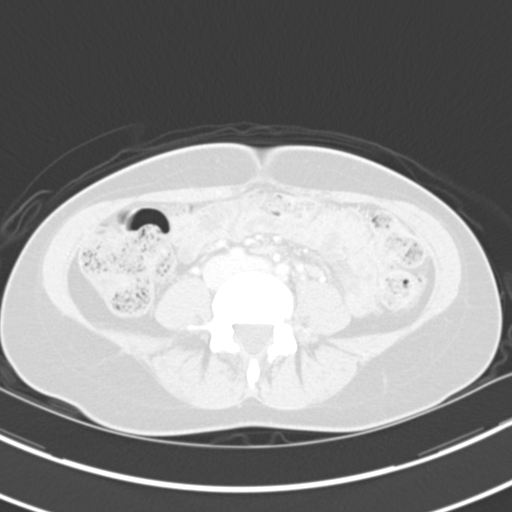
[im 53/85  soft-tissue]
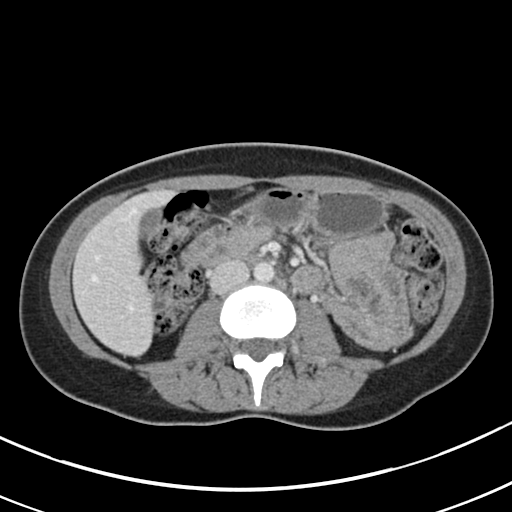
[im 53/85  lung]
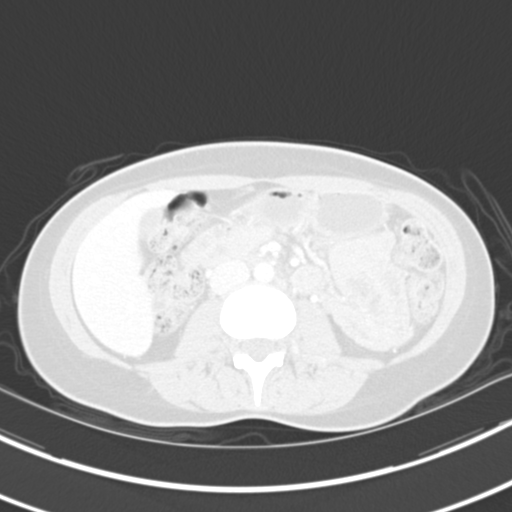
[im 64/85  soft-tissue]
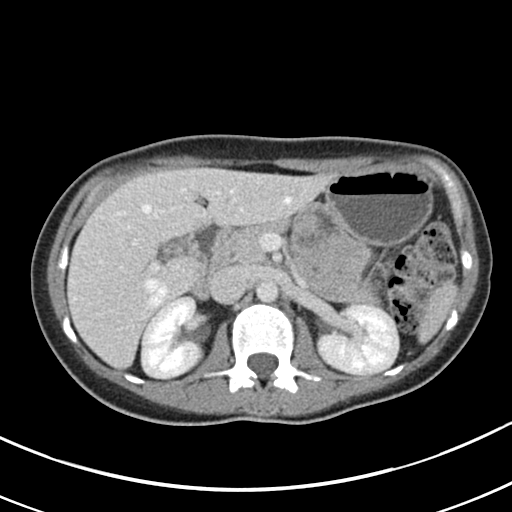
[im 64/85  lung]
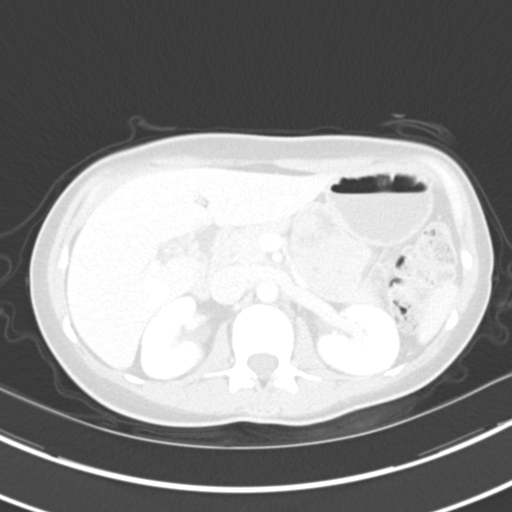
[im 74/85  soft-tissue]
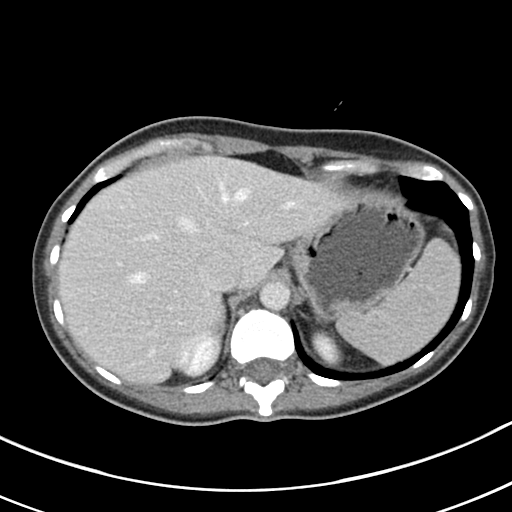
[im 74/85  lung]
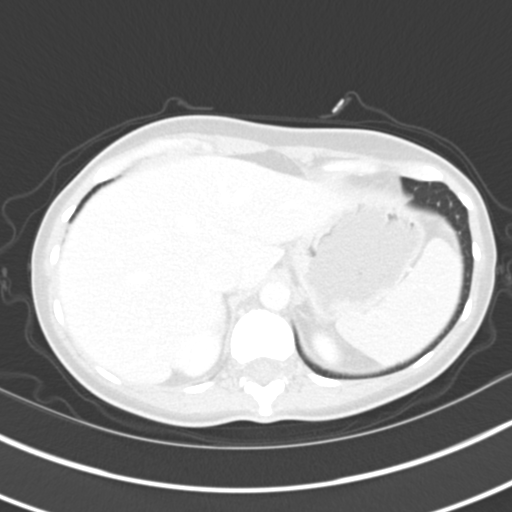

[Series 11: coronal arterial · coronal · arterial · 0.45mm/px · 3 of 62 slices shown, 4 images]
[im 16/62  soft-tissue]
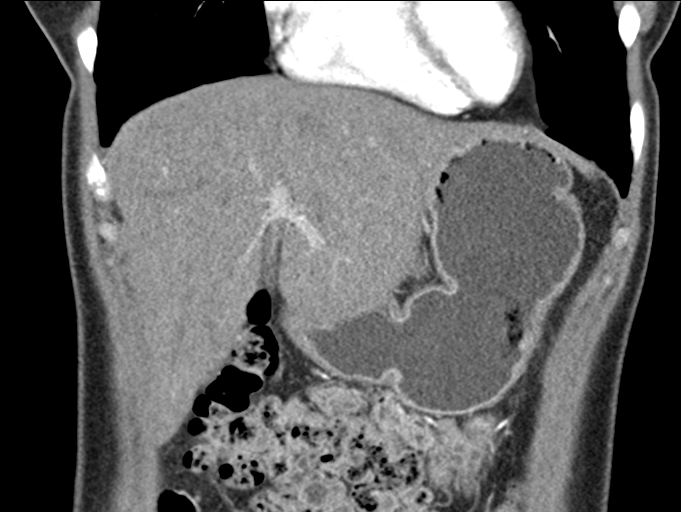
[im 31/62  soft-tissue]
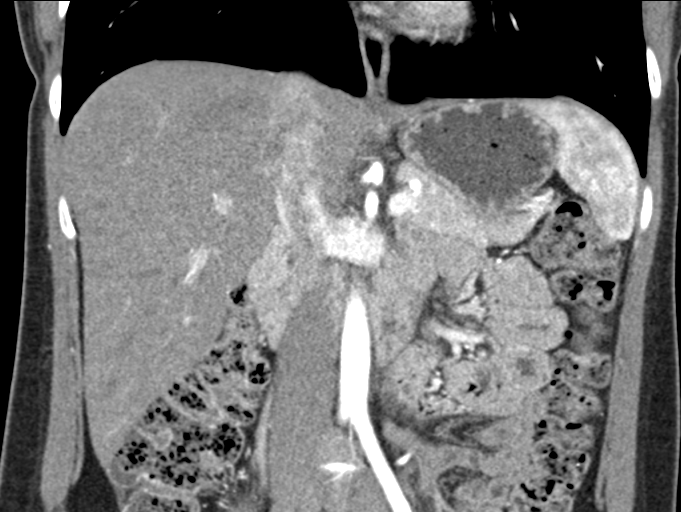
[im 31/62  bone]
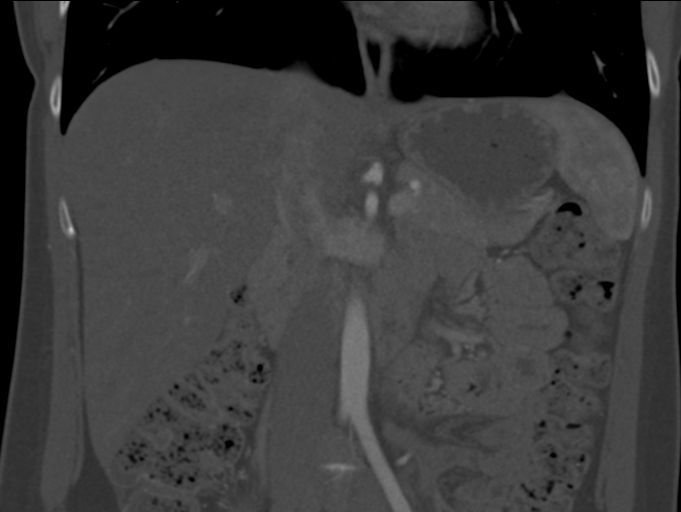
[im 46/62  soft-tissue]
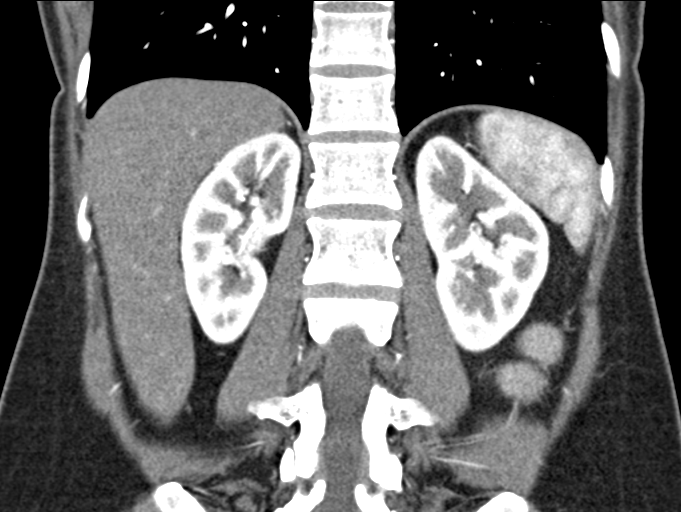

[12 of 46 positions shown; findings below may reference images not displayed]

FINDINGS: Lower chest: No acute abnormality.

Hepatobiliary: No focal liver abnormality is seen. No gallstones,
gallbladder wall thickening, or biliary dilatation.

Pancreas: Unremarkable. No pancreatic ductal dilatation or
surrounding inflammatory changes.

Spleen: Solitary low-density structure within the spleen measures
0.8 cm. Spleen is otherwise unremarkable.

Adrenals/Urinary Tract: Normal appearance of the adrenal glands. No
kidney mass or hydronephrosis. Urinary bladder appears normal.

Stomach/Bowel: Stomach is unremarkable. No bowel wall thickening,
inflammation or distension. The appendix is visualized and appears
normal.

Vascular/Lymphatic: No significant vascular findings are present. No
enlarged abdominal or pelvic lymph nodes.

Reproductive: Uterus and bilateral adnexa are unremarkable.

Other: No abdominal wall hernia or abnormality. Trace free fluid
within the pelvis is likely physiologic in a premenopausal female.

Musculoskeletal: No acute or significant osseous findings.
IMPRESSION: 1. No acute findings within the abdomen or pelvis. No pancreatic
mass identified.
2. Solitary low-density structure within the spleen measures 0.8 cm
and is favored to represent a benign abnormality.

## 2020-06-10 MED ORDER — IOHEXOL 300 MG/ML  SOLN
100.0000 mL | Freq: Once | INTRAMUSCULAR | Status: AC | PRN
  Administered 2020-06-10: 100 mL via INTRAVENOUS

## 2020-06-23 ENCOUNTER — Ambulatory Visit: Payer: No Typology Code available for payment source | Admitting: Endocrinology

## 2020-08-09 HISTORY — PX: OTHER SURGICAL HISTORY: SHX169

## 2020-08-20 ENCOUNTER — Emergency Department (HOSPITAL_BASED_OUTPATIENT_CLINIC_OR_DEPARTMENT_OTHER): Payer: No Typology Code available for payment source

## 2020-08-20 ENCOUNTER — Encounter (HOSPITAL_BASED_OUTPATIENT_CLINIC_OR_DEPARTMENT_OTHER): Payer: Self-pay

## 2020-08-20 ENCOUNTER — Other Ambulatory Visit: Payer: Self-pay

## 2020-08-20 ENCOUNTER — Inpatient Hospital Stay (HOSPITAL_BASED_OUTPATIENT_CLINIC_OR_DEPARTMENT_OTHER)
Admission: EM | Admit: 2020-08-20 | Discharge: 2020-08-23 | DRG: 199 | Disposition: A | Discharge status: Home / Self Care | Payer: No Typology Code available for payment source | Attending: Family Medicine | Admitting: Family Medicine

## 2020-08-20 DIAGNOSIS — Y838 Other surgical procedures as the cause of abnormal reaction of the patient, or of later complication, without mention of misadventure at the time of the procedure: Secondary | ICD-10-CM | POA: Diagnosis present

## 2020-08-20 DIAGNOSIS — Z818 Family history of other mental and behavioral disorders: Secondary | ICD-10-CM

## 2020-08-20 DIAGNOSIS — D509 Iron deficiency anemia, unspecified: Secondary | ICD-10-CM | POA: Diagnosis present

## 2020-08-20 DIAGNOSIS — R6510 Systemic inflammatory response syndrome (SIRS) of non-infectious origin without acute organ dysfunction: Secondary | ICD-10-CM | POA: Diagnosis not present

## 2020-08-20 DIAGNOSIS — J95811 Postprocedural pneumothorax: Secondary | ICD-10-CM | POA: Diagnosis present

## 2020-08-20 DIAGNOSIS — J9311 Primary spontaneous pneumothorax: Secondary | ICD-10-CM | POA: Diagnosis not present

## 2020-08-20 DIAGNOSIS — Z8051 Family history of malignant neoplasm of kidney: Secondary | ICD-10-CM

## 2020-08-20 DIAGNOSIS — J9383 Other pneumothorax: Secondary | ICD-10-CM

## 2020-08-20 DIAGNOSIS — J189 Pneumonia, unspecified organism: Secondary | ICD-10-CM | POA: Diagnosis present

## 2020-08-20 DIAGNOSIS — Z800 Family history of malignant neoplasm of digestive organs: Secondary | ICD-10-CM | POA: Diagnosis not present

## 2020-08-20 DIAGNOSIS — Z79899 Other long term (current) drug therapy: Secondary | ICD-10-CM | POA: Diagnosis not present

## 2020-08-20 DIAGNOSIS — F419 Anxiety disorder, unspecified: Secondary | ICD-10-CM | POA: Diagnosis present

## 2020-08-20 DIAGNOSIS — J939 Pneumothorax, unspecified: Secondary | ICD-10-CM | POA: Diagnosis present

## 2020-08-20 DIAGNOSIS — A419 Sepsis, unspecified organism: Secondary | ICD-10-CM | POA: Diagnosis present

## 2020-08-20 DIAGNOSIS — Z20822 Contact with and (suspected) exposure to covid-19: Secondary | ICD-10-CM | POA: Diagnosis present

## 2020-08-20 DIAGNOSIS — J90 Pleural effusion, not elsewhere classified: Principal | ICD-10-CM | POA: Diagnosis present

## 2020-08-20 DIAGNOSIS — Z803 Family history of malignant neoplasm of breast: Secondary | ICD-10-CM

## 2020-08-20 DIAGNOSIS — R079 Chest pain, unspecified: Secondary | ICD-10-CM | POA: Diagnosis present

## 2020-08-20 DIAGNOSIS — Z9889 Other specified postprocedural states: Secondary | ICD-10-CM

## 2020-08-20 LAB — PROTIME-INR
INR: 1.1 (ref 0.8–1.2)
Prothrombin Time: 14.1 seconds (ref 11.4–15.2)

## 2020-08-20 LAB — URINALYSIS, ROUTINE W REFLEX MICROSCOPIC
Bilirubin Urine: NEGATIVE
Glucose, UA: NEGATIVE mg/dL
Ketones, ur: 15 mg/dL — AB
Leukocytes,Ua: NEGATIVE
Nitrite: NEGATIVE
Protein, ur: NEGATIVE mg/dL
Specific Gravity, Urine: 1.003 — ABNORMAL LOW (ref 1.005–1.030)
pH: 5.5 (ref 5.0–8.0)

## 2020-08-20 LAB — RETICULOCYTES
Immature Retic Fract: 21.3 % — ABNORMAL HIGH (ref 2.3–15.9)
RBC.: 3.42 MIL/uL — ABNORMAL LOW (ref 3.87–5.11)
Retic Count, Absolute: 78.7 10*3/uL (ref 19.0–186.0)
Retic Ct Pct: 2.3 % (ref 0.4–3.1)

## 2020-08-20 LAB — COMPREHENSIVE METABOLIC PANEL
ALT: 54 U/L — ABNORMAL HIGH (ref 0–44)
AST: 59 U/L — ABNORMAL HIGH (ref 15–41)
Albumin: 3.8 g/dL (ref 3.5–5.0)
Alkaline Phosphatase: 63 U/L (ref 38–126)
Anion gap: 11 (ref 5–15)
BUN: 8 mg/dL (ref 6–20)
CO2: 24 mmol/L (ref 22–32)
Calcium: 8.9 mg/dL (ref 8.9–10.3)
Chloride: 101 mmol/L (ref 98–111)
Creatinine, Ser: 0.49 mg/dL (ref 0.44–1.00)
GFR, Estimated: >60 mL/min (ref >60)
Glucose, Bld: 93 mg/dL (ref 70–99)
Potassium: 4 mmol/L (ref 3.5–5.1)
Sodium: 136 mmol/L (ref 135–145)
Total Bilirubin: 1 mg/dL (ref 0.3–1.2)
Total Protein: 7 g/dL (ref 6.5–8.1)

## 2020-08-20 LAB — TROPONIN I (HIGH SENSITIVITY)
Troponin I (High Sensitivity): <2 ng/L (ref <18)
Troponin I (High Sensitivity): <2 ng/L (ref <18)

## 2020-08-20 LAB — CBC WITH DIFFERENTIAL/PLATELET
Abs Immature Granulocytes: 0.2 10*3/uL — ABNORMAL HIGH (ref 0.00–0.07)
Basophils Absolute: 0.1 10*3/uL (ref 0.0–0.1)
Basophils Relative: 1 %
Eosinophils Absolute: 0.8 10*3/uL — ABNORMAL HIGH (ref 0.0–0.5)
Eosinophils Relative: 6 %
HCT: 33 % — ABNORMAL LOW (ref 36.0–46.0)
Hemoglobin: 10.8 g/dL — ABNORMAL LOW (ref 12.0–15.0)
Immature Granulocytes: 2 %
Lymphocytes Relative: 15 %
Lymphs Abs: 2 10*3/uL (ref 0.7–4.0)
MCH: 28.3 pg (ref 26.0–34.0)
MCHC: 32.7 g/dL (ref 30.0–36.0)
MCV: 86.4 fL (ref 80.0–100.0)
Monocytes Absolute: 1.1 10*3/uL — ABNORMAL HIGH (ref 0.1–1.0)
Monocytes Relative: 8 %
Neutro Abs: 9 10*3/uL — ABNORMAL HIGH (ref 1.7–7.7)
Neutrophils Relative %: 68 %
Platelets: 440 10*3/uL — ABNORMAL HIGH (ref 150–400)
RBC: 3.82 MIL/uL — ABNORMAL LOW (ref 3.87–5.11)
RDW: 14.2 % (ref 11.5–15.5)
WBC: 13.1 10*3/uL — ABNORMAL HIGH (ref 4.0–10.5)
nRBC: 0 % (ref 0.0–0.2)

## 2020-08-20 LAB — RESP PANEL BY RT-PCR (FLU A&B, COVID) ARPGX2
Influenza A by PCR: NEGATIVE
Influenza B by PCR: NEGATIVE
SARS Coronavirus 2 by RT PCR: NEGATIVE

## 2020-08-20 LAB — LACTATE DEHYDROGENASE: LDH: 266 U/L — ABNORMAL HIGH (ref 98–192)

## 2020-08-20 LAB — LACTIC ACID, PLASMA: Lactic Acid, Venous: 0.7 mmol/L (ref 0.5–1.9)

## 2020-08-20 LAB — LIPASE, BLOOD: Lipase: <10 U/L — ABNORMAL LOW (ref 11–51)

## 2020-08-20 LAB — CK: Total CK: 22 U/L — ABNORMAL LOW (ref 38–234)

## 2020-08-20 IMAGING — CT CT ANGIO CHEST
1 of 5 series · 12 of 30 positions shown · IV contrast (omnipaque)
Comparison: CT abdomen pelvis [DATE]

CLINICAL DATA: Recent appendectomy.  Chest pain

EXAM:
CT ANGIOGRAPHY CHEST
CT ABDOMEN AND PELVIS WITH CONTRAST
TECHNIQUE: Multidetector CT imaging of the chest was performed using the
standard protocol during bolus administration of intravenous
contrast. Multiplanar CT image reconstructions and MIPs were
obtained to evaluate the vascular anatomy. Multidetector CT imaging
of the abdomen and pelvis was performed using the standard protocol
during bolus administration of intravenous contrast.
CONTRAST:  100mL OMNIPAQUE IOHEXOL 350 MG/ML SOLN

[Series 3: pe axial thins · axial · 0.61mm/px · z∈[+983,+1269]mm · 12 of 339 slices shown]
[im 27/339  lung]
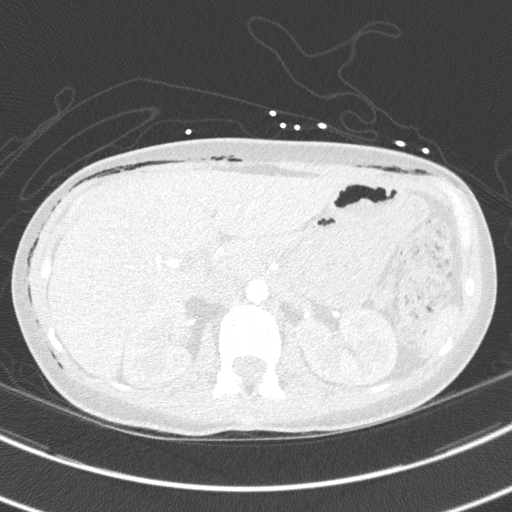
[im 53/339  mediastinal]
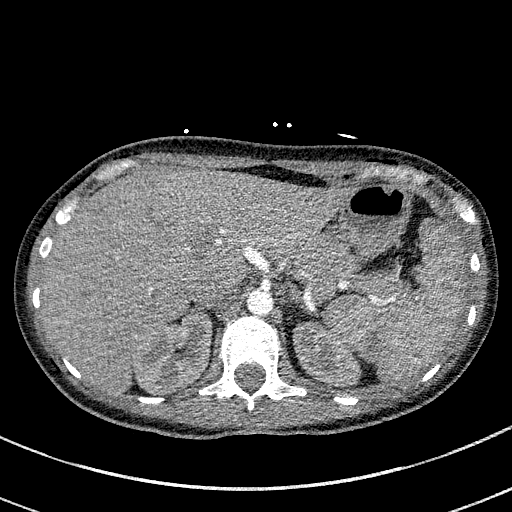
[im 79/339  lung]
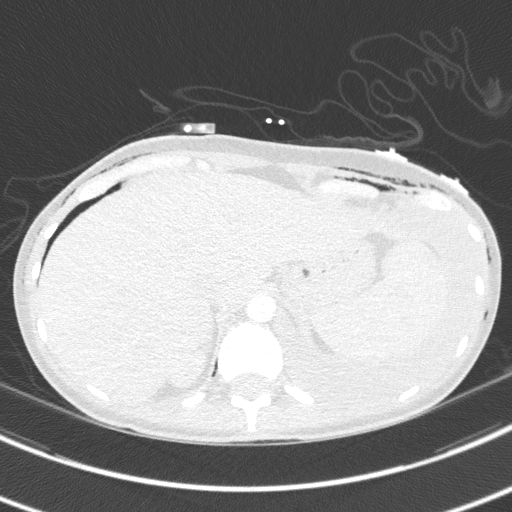
[im 105/339  mediastinal]
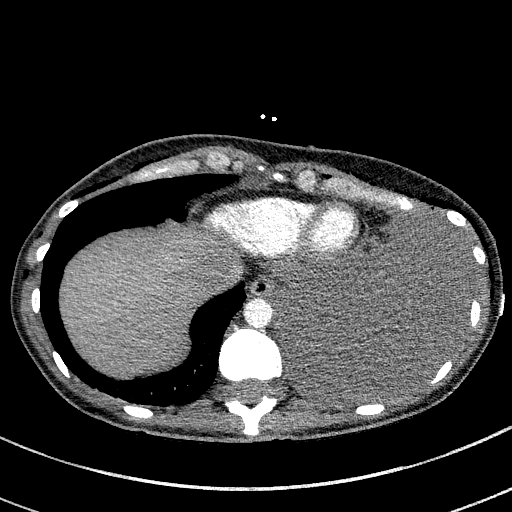
[im 131/339  lung]
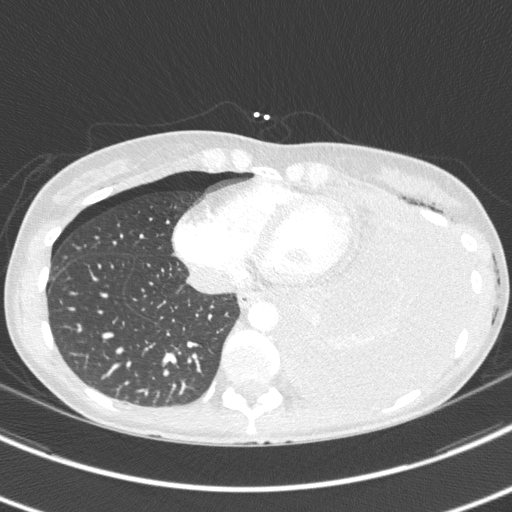
[im 157/339  mediastinal]
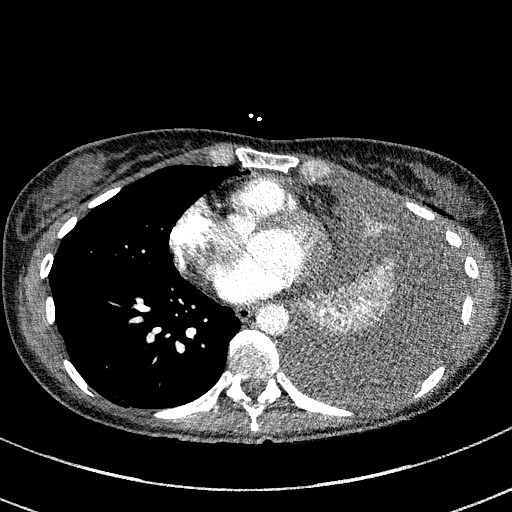
[im 183/339  lung]
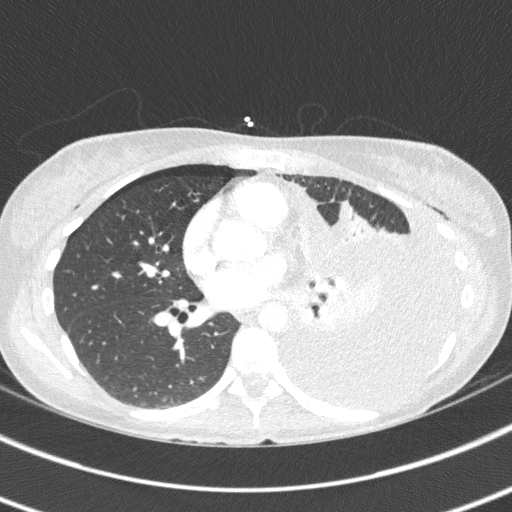
[im 209/339  mediastinal]
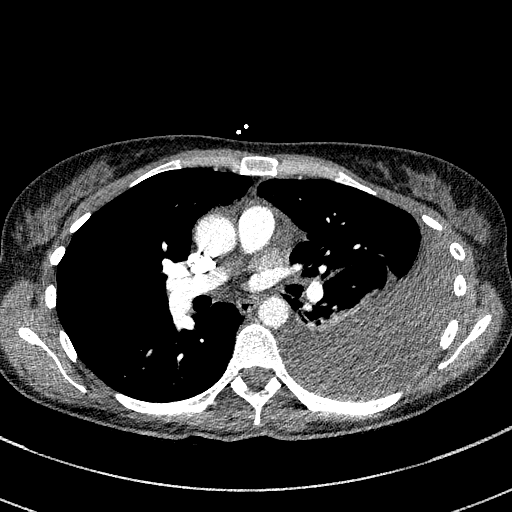
[im 235/339  lung]
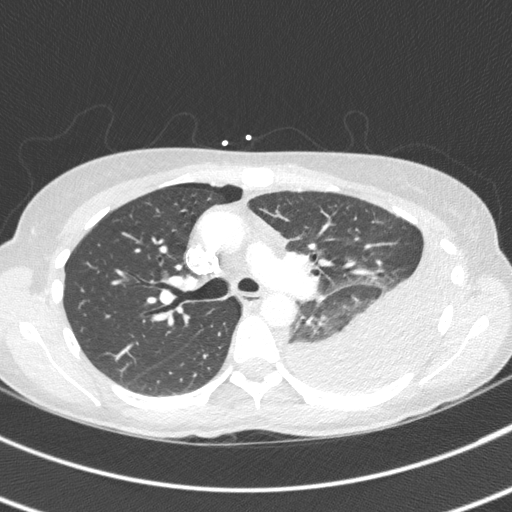
[im 261/339  mediastinal]
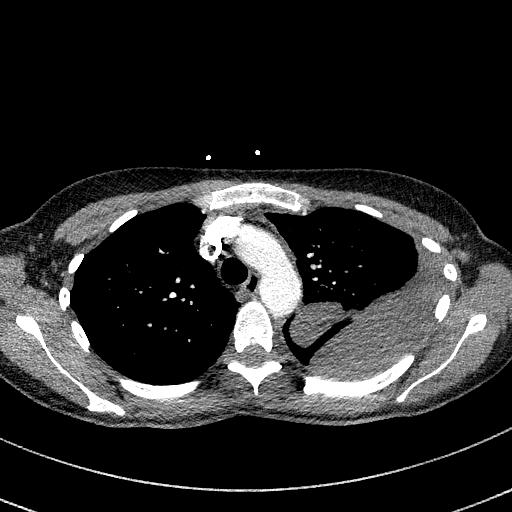
[im 287/339  lung]
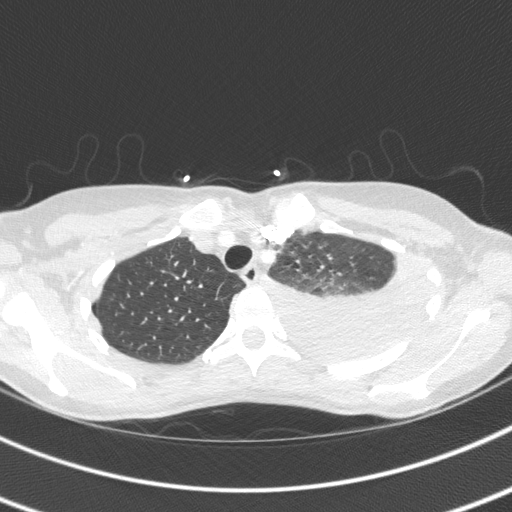
[im 313/339  mediastinal]
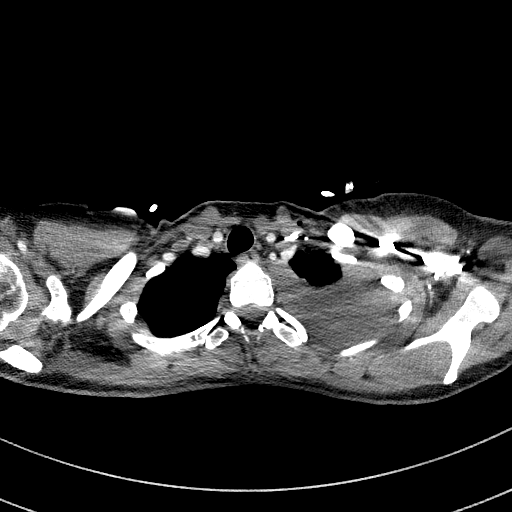

[12 of 30 positions shown; findings below may reference images not displayed]

FINDINGS: CTA CHEST FINDINGS

Cardiovascular: No filling defects within the pulmonary arteries to
suggest acute pulmonary embolism. No acute findings aorta great
vessels. No pericardial fluid.

Mediastinum/Nodes: No axillary or supraclavicular adenopathy. No
mediastinal or hilar adenopathy. No pericardial fluid. Esophagus
normal.

Lungs/Pleura: New large volume LEFT pleural effusion occupying
approximately 60% of the LEFT hemithorax. The LEFT pleural fluid is
intermediate density ranging size from HU from 20-25. There is
complete passive atelectasis of the LEFT lower lobe. Atelectasis of
the lingula additionally. Superior LEFT upper lobe is aerated.

Additionally, there is a small to moderate volume RIGHT pneumothorax
which collects anterior POSTRES within the RIGHT hemithorax in the
supine position as well as over the RIGHT lung apex. Estimated
volume approximately 15% of RIGHT hemithorax volume.

Musculoskeletal: No rib fractures. Subcutaneous gas in the anterior
chest wall along the costal margin.

Review of the MIP images confirms the above findings.

CT ABDOMEN and PELVIS FINDINGS

Hepatobiliary: No focal hepatic lesion. No biliary duct dilatation.
Common bile duct is normal.

Pancreas: Pancreas is normal. No ductal dilatation. No pancreatic
inflammation.

Spleen: Low-density cysts within the spleen appears benign

Adrenals/urinary tract: Adrenal glands and kidneys appear normal.
Ureters and bladder normal.

Stomach/Bowel: Stomach small normal. No complication LEFT lower
quadrant related to recent appendectomy. The ascending, transverse
and descending colon normal.

Vascular/Lymphatic: Abdominal aorta is normal caliber. No periportal
or retroperitoneal adenopathy. No pelvic adenopathy.

Reproductive: A post hysterectomy. Small amount free fluid the
pelvis. Abscess formation in the pelvis. No intraperitoneal free
air.

Other: extensive subcutaneous gas along the anterior abdominal and
chest wall extending from the suprapubic region to the costal
margin. Subcutaneous gas seen well on coronal image 57/series 10

Musculoskeletal: No aggressive osseous lesion.  No traumatic injury.

Review of the MIP images confirms the above findings.
IMPRESSION: Chest Impression:

1. Large volume intermediate density LEFT pleural effusion occupying
approximately 60% of the LEFT hemithorax volume. Complete passive
atelectasis of the LEFT lower lobe and lingula.
2. Small to moderate volume RIGHT pneumothorax collects anteriorly
within the RIGHT hemithorax.
3. Subcutaneous gas extends along the LEFT and RIGHT chest wall up
to level of the breasts.
4. No evidence acute pulmonary embolism.

Abdomen / Pelvis Impression:

1. Subcutaneous gas extends along the abdominal wall from the pubic
symphysis to the costal margin on the LEFT and RIGHT.
2. No complicating features within the peritoneal space related to
the appendectomy and hysterectomy. Small amount of free fluid the
pelvis as expected. No organized fluid collections. No abscess. No
intraperitoneal free air.

Critical Value/emergent results were called by telephone at the time
of interpretation on [DATE] at [DATE] to provider POSTRES, who
verbally acknowledged these results.

## 2020-08-20 IMAGING — CT CT ABD-PELV W/ CM
1 of 3 series · 12 of 32 positions shown, 17 images · IV contrast (omnipaque)
Comparison: CT abdomen pelvis [DATE]

CLINICAL DATA: Recent appendectomy.  Chest pain

EXAM:
CT ANGIOGRAPHY CHEST
CT ABDOMEN AND PELVIS WITH CONTRAST
TECHNIQUE: Multidetector CT imaging of the chest was performed using the
standard protocol during bolus administration of intravenous
contrast. Multiplanar CT image reconstructions and MIPs were
obtained to evaluate the vascular anatomy. Multidetector CT imaging
of the abdomen and pelvis was performed using the standard protocol
during bolus administration of intravenous contrast.
CONTRAST:  100mL OMNIPAQUE IOHEXOL 350 MG/ML SOLN

[Series 7: abd pel w · axial · 0.61mm/px · z∈[+665,+1070]mm · 12 of 95 slices shown, 17 images]
[im 7/95  soft-tissue]
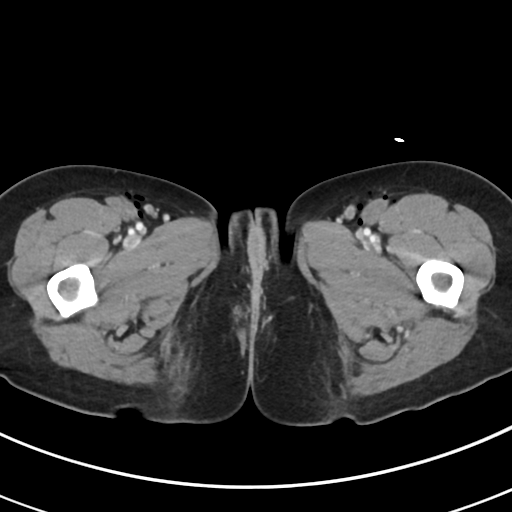
[im 7/95  bone]
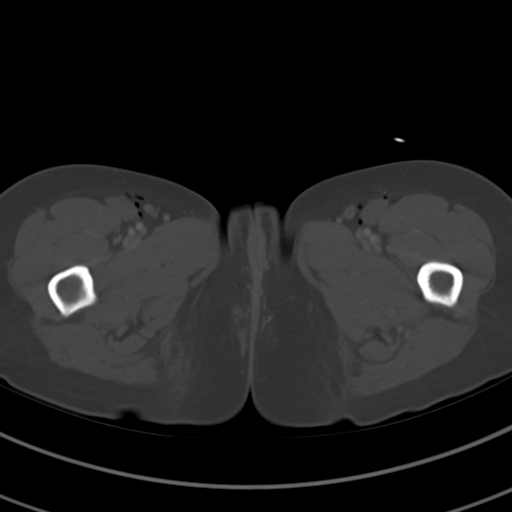
[im 14/95  soft-tissue]
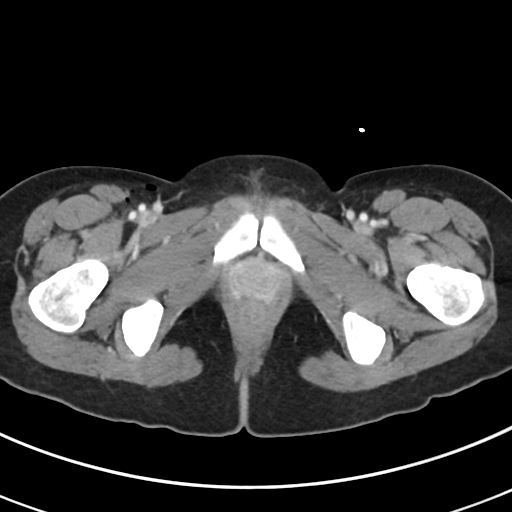
[im 21/95  soft-tissue]
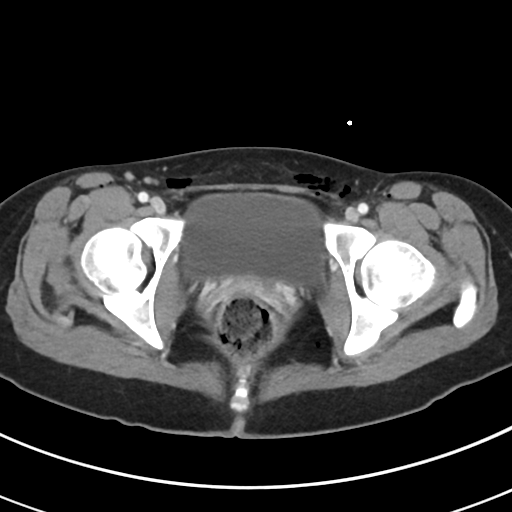
[im 34/95  soft-tissue]
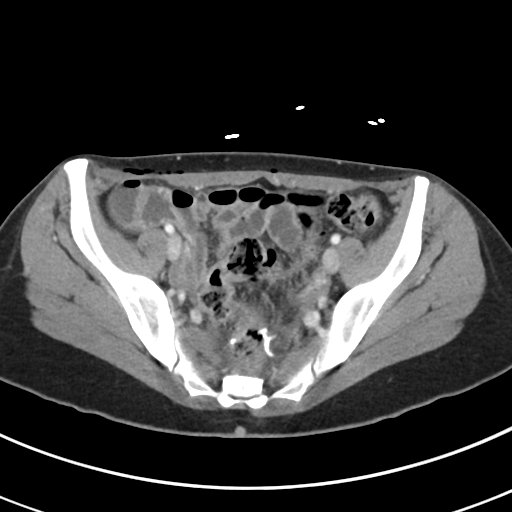
[im 41/95  soft-tissue]
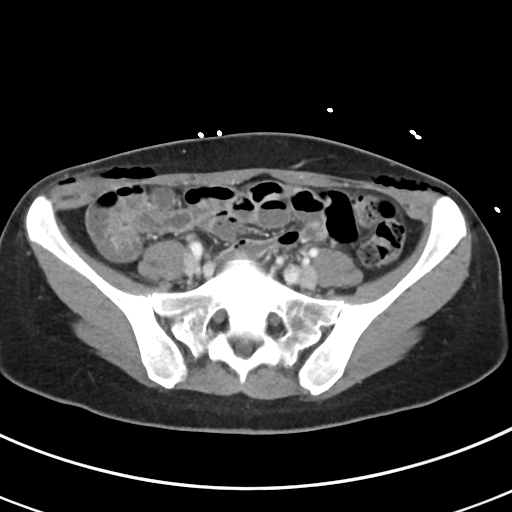
[im 48/95  soft-tissue]
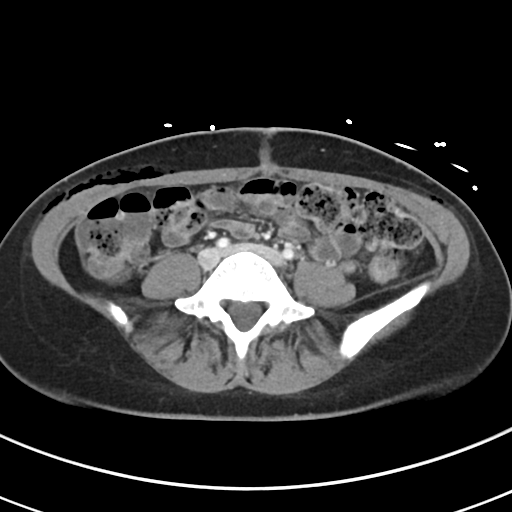
[im 54/95  soft-tissue]
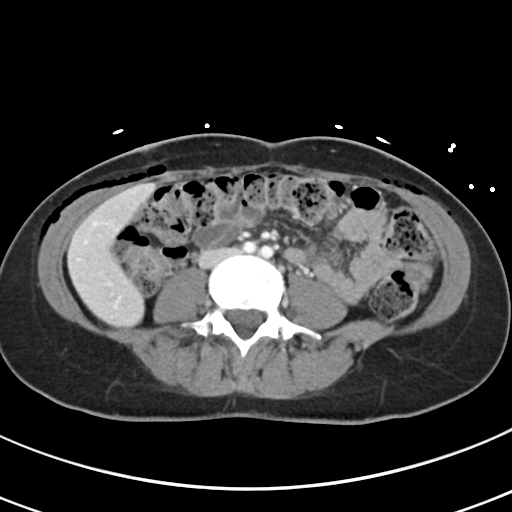
[im 61/95  soft-tissue]
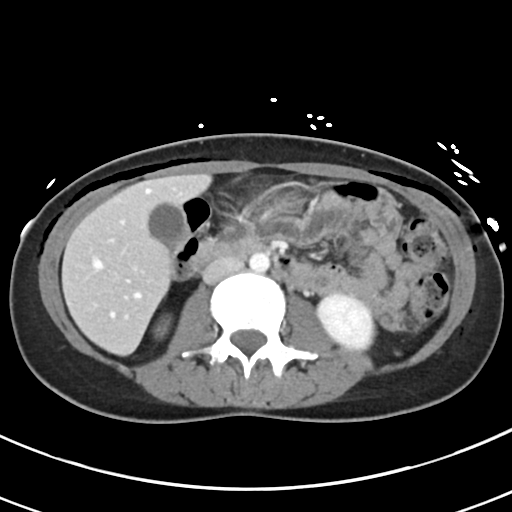
[im 68/95  lung]
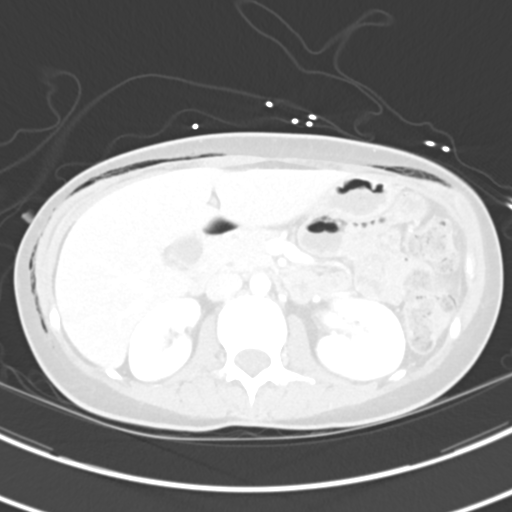
[im 74/95  soft-tissue]
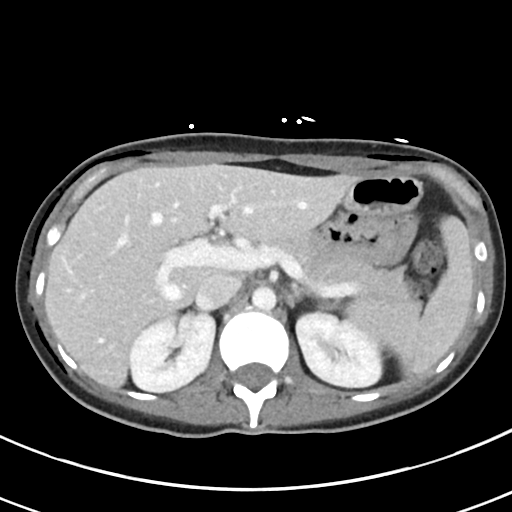
[im 74/95  lung]
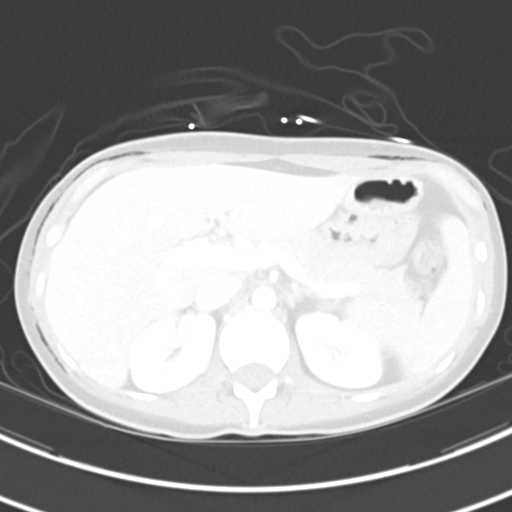
[im 74/95  bone]
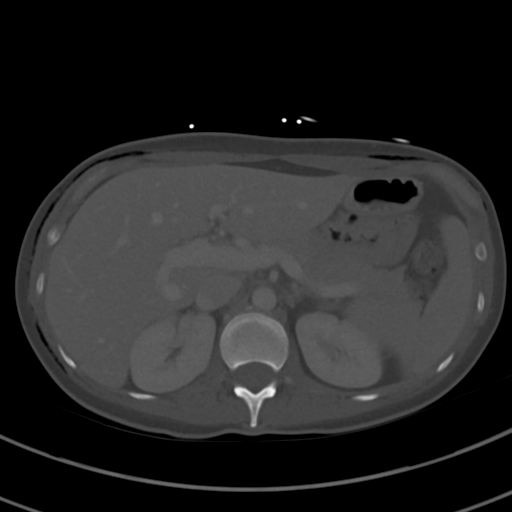
[im 81/95  soft-tissue]
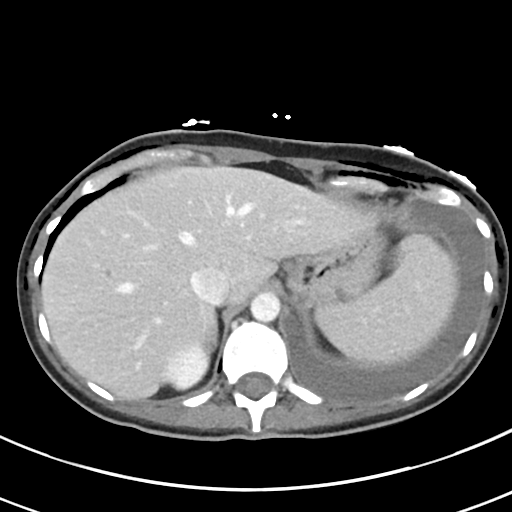
[im 81/95  lung]
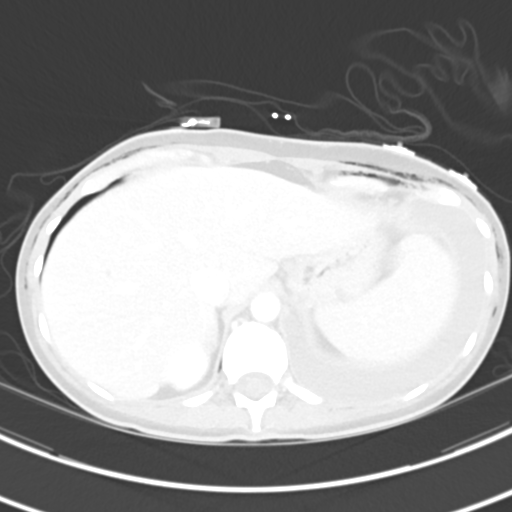
[im 88/95  soft-tissue]
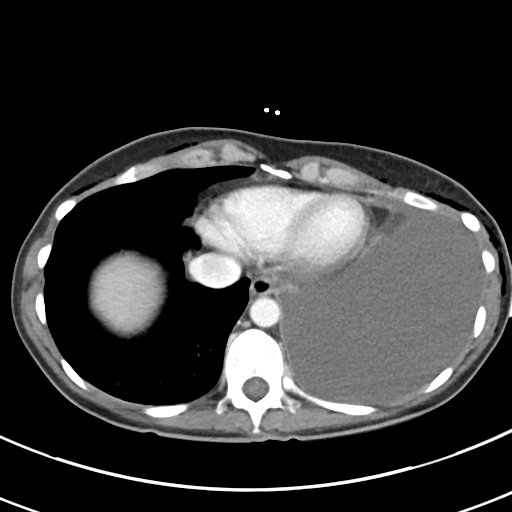
[im 88/95  lung]
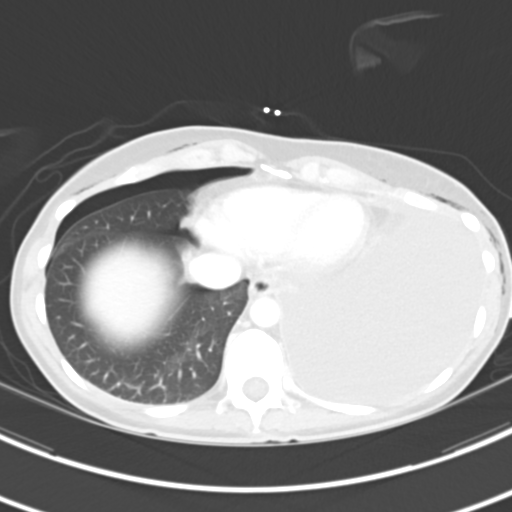

[12 of 32 positions shown; findings below may reference images not displayed]

FINDINGS: CTA CHEST FINDINGS

Cardiovascular: No filling defects within the pulmonary arteries to
suggest acute pulmonary embolism. No acute findings aorta great
vessels. No pericardial fluid.

Mediastinum/Nodes: No axillary or supraclavicular adenopathy. No
mediastinal or hilar adenopathy. No pericardial fluid. Esophagus
normal.

Lungs/Pleura: New large volume LEFT pleural effusion occupying
approximately 60% of the LEFT hemithorax. The LEFT pleural fluid is
intermediate density ranging size from HU from 20-25. There is
complete passive atelectasis of the LEFT lower lobe. Atelectasis of
the lingula additionally. Superior LEFT upper lobe is aerated.

Additionally, there is a small to moderate volume RIGHT pneumothorax
which collects anterior POSTRES within the RIGHT hemithorax in the
supine position as well as over the RIGHT lung apex. Estimated
volume approximately 15% of RIGHT hemithorax volume.

Musculoskeletal: No rib fractures. Subcutaneous gas in the anterior
chest wall along the costal margin.

Review of the MIP images confirms the above findings.

CT ABDOMEN and PELVIS FINDINGS

Hepatobiliary: No focal hepatic lesion. No biliary duct dilatation.
Common bile duct is normal.

Pancreas: Pancreas is normal. No ductal dilatation. No pancreatic
inflammation.

Spleen: Low-density cysts within the spleen appears benign

Adrenals/urinary tract: Adrenal glands and kidneys appear normal.
Ureters and bladder normal.

Stomach/Bowel: Stomach small normal. No complication LEFT lower
quadrant related to recent appendectomy. The ascending, transverse
and descending colon normal.

Vascular/Lymphatic: Abdominal aorta is normal caliber. No periportal
or retroperitoneal adenopathy. No pelvic adenopathy.

Reproductive: A post hysterectomy. Small amount free fluid the
pelvis. Abscess formation in the pelvis. No intraperitoneal free
air.

Other: extensive subcutaneous gas along the anterior abdominal and
chest wall extending from the suprapubic region to the costal
margin. Subcutaneous gas seen well on coronal image 57/series 10

Musculoskeletal: No aggressive osseous lesion.  No traumatic injury.

Review of the MIP images confirms the above findings.
IMPRESSION: Chest Impression:

1. Large volume intermediate density LEFT pleural effusion occupying
approximately 60% of the LEFT hemithorax volume. Complete passive
atelectasis of the LEFT lower lobe and lingula.
2. Small to moderate volume RIGHT pneumothorax collects anteriorly
within the RIGHT hemithorax.
3. Subcutaneous gas extends along the LEFT and RIGHT chest wall up
to level of the breasts.
4. No evidence acute pulmonary embolism.

Abdomen / Pelvis Impression:

1. Subcutaneous gas extends along the abdominal wall from the pubic
symphysis to the costal margin on the LEFT and RIGHT.
2. No complicating features within the peritoneal space related to
the appendectomy and hysterectomy. Small amount of free fluid the
pelvis as expected. No organized fluid collections. No abscess. No
intraperitoneal free air.

Critical Value/emergent results were called by telephone at the time
of interpretation on [DATE] at [DATE] to provider POSTRES, who
verbally acknowledged these results.

## 2020-08-20 MED ORDER — LACTATED RINGERS IV BOLUS: 500.0000 mL | Freq: Once | INTRAVENOUS | Status: DC

## 2020-08-20 MED ORDER — VANCOMYCIN HCL 750 MG/150ML IV SOLN
750.0000 mg | Freq: Two times a day (BID) | INTRAVENOUS | Status: DC
  Administered 2020-08-21 (×2): 750 mg via INTRAVENOUS
  Filled 2020-08-20 (×4): qty 150

## 2020-08-20 MED ORDER — IOHEXOL 350 MG/ML SOLN
100.0000 mL | Freq: Once | INTRAVENOUS | Status: AC | PRN
  Administered 2020-08-20: 100 mL via INTRAVENOUS

## 2020-08-20 MED ORDER — LACTATED RINGERS IV BOLUS
1000.0000 mL | Freq: Once | INTRAVENOUS | Status: AC
  Administered 2020-08-20: 1000 mL via INTRAVENOUS

## 2020-08-20 MED ORDER — ACETAMINOPHEN 500 MG PO TABS
1000.0000 mg | ORAL_TABLET | Freq: Once | ORAL | Status: AC
  Administered 2020-08-20: 1000 mg via ORAL
  Filled 2020-08-20: qty 2

## 2020-08-20 MED ORDER — VANCOMYCIN HCL IN DEXTROSE 1-5 GM/200ML-% IV SOLN
1000.0000 mg | Freq: Once | INTRAVENOUS | Status: AC
  Administered 2020-08-20: 1000 mg via INTRAVENOUS
  Filled 2020-08-20: qty 200

## 2020-08-20 MED ORDER — SODIUM CHLORIDE 0.9 % IV SOLN
2.0000 g | Freq: Once | INTRAVENOUS | Status: AC
  Administered 2020-08-20: 2 g via INTRAVENOUS
  Filled 2020-08-20: qty 2

## 2020-08-20 MED ORDER — HYDROCODONE-ACETAMINOPHEN 5-325 MG PO TABS: 1.0000 | ORAL_TABLET | ORAL | Status: DC | PRN

## 2020-08-20 MED ORDER — SODIUM CHLORIDE 0.9 % IV SOLN: 75.0000 mL/h | INTRAVENOUS | Status: AC

## 2020-08-20 MED ORDER — ACETAMINOPHEN 325 MG PO TABS
650.0000 mg | ORAL_TABLET | Freq: Four times a day (QID) | ORAL | Status: DC | PRN
  Administered 2020-08-21 – 2020-08-23 (×8): 650 mg via ORAL
  Filled 2020-08-20 (×8): qty 2

## 2020-08-20 MED ORDER — SODIUM CHLORIDE 0.9 % IV SOLN
2.0000 g | Freq: Three times a day (TID) | INTRAVENOUS | Status: DC
  Administered 2020-08-21 – 2020-08-23 (×7): 2 g via INTRAVENOUS
  Filled 2020-08-20 (×7): qty 2

## 2020-08-20 MED ORDER — CLONAZEPAM 0.5 MG PO TABS
0.5000 mg | ORAL_TABLET | ORAL | Status: DC | PRN
  Administered 2020-08-20 – 2020-08-22 (×2): 0.5 mg via ORAL
  Filled 2020-08-20 (×2): qty 1

## 2020-08-20 MED ORDER — ONDANSETRON HCL 4 MG/2ML IJ SOLN
4.0000 mg | Freq: Four times a day (QID) | INTRAMUSCULAR | Status: DC | PRN
  Administered 2020-08-20 – 2020-08-23 (×8): 4 mg via INTRAVENOUS
  Filled 2020-08-20 (×8): qty 2

## 2020-08-20 MED ORDER — LACTATED RINGERS IV SOLN: INTRAVENOUS | Status: DC

## 2020-08-20 MED ORDER — ACETAMINOPHEN 650 MG RE SUPP: 650.0000 mg | Freq: Four times a day (QID) | RECTAL | Status: DC | PRN

## 2020-08-20 NOTE — ED Provider Notes (Signed)
MSE was initiated and I personally evaluated the patient and placed orders (if any) at  3:11 PM on August 20, 2020.  The patient appears stable so that the remainder of the MSE may be completed by another provider.  Patient had recent surgery about 10 days ago.  Had a hysterectomy with bowel resection, appendectomy for endometriosis.  Started to develop some chest pain, shortness of breath, left-sided abdominal pain last several days.  Surgery was in [place].  Abdomen overall appears unremarkable on exam.  Not really focally tender.  Surgical site well-appearing.  She is little tachycardic.  Concern for PE or postop problem.  We will get basic labs including troponin.  EKG shows sinus rhythm.  No ischemic changes.  Will evaluate for PE with CT scan.  I will also get a CT scan abdomen and pelvis to look for any postop problems such as abscess.  Patient was discussed with Dr. Clarice Pole who will assume care of the patient.  Please see her note for further results, evaluation, disposition of the patient.   Virgina Norfolk, DO 08/20/20 1512

## 2020-08-20 NOTE — ED Notes (Signed)
Doutova MD at bedside. 

## 2020-08-20 NOTE — Progress Notes (Signed)
Pt ambulated to restroom and back w/minimal SOB, some chest tightness during ambulation. Pt sats remained stable throughout ambulation. RT will continue to monitor.

## 2020-08-20 NOTE — ED Provider Notes (Signed)
[facility] EMERGENCY DEPT Provider Note   CSN: [id number] Arrival date & time: 08/20/20  1419     History Chief Complaint  Patient presents with  . Chest Pain    Yvonne Garcia is a 35 y.o. female.  HPI Patient is an otherwise healthy 35 year old.  She has a history of significant endometriosis.  She went to [facility] for endometriosis care and was operatively managed by Dr. Tonia Brooms.  She underwent a total hysterectomy and about 6 cm of bowel resection.  She also had a appendectomy and total hysterectomy.  This was all done laparoscopically.  Patient reports that she was hospitalized 6 days postoperative due to 2 episodes of anemia requiring blood transfusion.  She reports she was having some difficulty at the hospital feeling chest tightness.  They were highly encouraging her to use her incentive spirometer.  She reports symptoms improved and by the time she went home she felt pretty well.  Over the past 2 to 3 days now however she has been increasingly short of breath and noticing that her heart rate has been going up.  She also has some increased chest pain, when she had chest pain in the hospital, it was attributed to her postoperative state.  She reports she had been taking Tylenol around-the-clock and is been doing a fairly good job of controlling her pain.  Denies any lower extremity swelling or calf pain.    Past Medical History:  Diagnosis Date  . Allergies   . Anxiety   . Family history of breast cancer 05/05/2020  . Family history of colon cancer 05/05/2020  . Family history of kidney cancer 05/05/2020  . Family history of pancreatic cancer 05/05/2020  . Internal hemorrhoids     Patient Active Problem List   Diagnosis Date Noted  . Pneumothorax 08/20/2020  . Pleural effusion 08/20/2020  . SIRS (systemic inflammatory response syndrome) (HCC) 08/20/2020  . Genetic testing 05/24/2020  . Family history of pancreatic cancer 05/05/2020  . Family  history of breast cancer 05/05/2020  . Family history of colon cancer 05/05/2020  . Family history of kidney cancer 05/05/2020  . Family history of cancer 04/13/2020  . Iron deficiency anemia 04/13/2020    Past Surgical History:  Procedure Laterality Date  . ABDOMINAL HYSTERECTOMY    . APPENDECTOMY    . LAPAROSCOPIC BOWEL RESECTION    . TONSILLECTOMY    . WISDOM TOOTH EXTRACTION       OB History   No obstetric history on file.     Family History  Problem Relation Age of Onset  . Mental illness Mother        suicide.   . Pancreatic cancer Mother 31  . Breast cancer Mother 54  . Kidney cancer Father 28  . Colon cancer Maternal Grandfather        dx 74s  . Kidney cancer Paternal Grandfather        dx unknown age    Social History   Tobacco Use  . Smoking status: Never Smoker  . Smokeless tobacco: Never Used  Vaping Use  . Vaping Use: Never used  Substance Use Topics  . Alcohol use: Yes    Comment: rare  . Drug use: Never    Home Medications Prior to Admission medications   Medication Sig Start Date End Date Taking? Authorizing Provider  acetaminophen (TYLENOL) 500 MG tablet Take 1,000 mg by mouth every 6 (six) hours as needed for moderate pain or headache.  Yes [provider]  cholecalciferol (VITAMIN D3) 25 MCG (1000 UNIT) tablet Take 1,000 Units by mouth daily.   Yes [provider]  clonazePAM (KLONOPIN) 0.5 MG tablet Take 1 tablet by mouth daily as needed for anxiety. 03/11/16  Yes [provider]  docusate sodium (COLACE) 100 MG capsule Take 100 mg by mouth daily.   Yes [provider]  Ferrous Sulfate (IRON PO) Take 1 each by mouth daily.   Yes [provider]  fluticasone (FLONASE) 50 MCG/ACT nasal spray Place 1 spray into both nostrils daily as needed for allergies.   Yes [provider]  Simethicone (GAS-X PO) Take 1 tablet by mouth daily as needed (gas/bloating).   Yes [provider]   vitamin B-12 (CYANOCOBALAMIN) 1000 MCG tablet Take 1,000 mcg by mouth daily.   Yes [provider]  ZOFRAN 4 MG tablet Take 4 mg by mouth every 6 (six) hours as needed for nausea or vomiting. 08/15/20  Yes [provider]  dicyclomine (BENTYL) 20 MG tablet Take 1 tablet (20 mg total) by mouth 3 (three) times daily as needed for spasms. Patient not taking: No sig reported 01/29/20   Pyrtle, Carie Caddy, MD  Vitamin D, Ergocalciferol, (DRISDOL) 1.25 MG (50000 UNIT) CAPS capsule Take 50,000 Units by mouth. Patient not taking: No sig reported 04/05/20   [provider]    Allergies    Patient has no known allergies.  Review of Systems   Review of Systems 10 systems reviewed and negative except as per HPI Physical Exam Updated Vital Signs BP 100/66 (BP Location: Left Arm)   Pulse (!) 114   Temp 98.6 F (37 C) (Oral)   Resp 20   Ht  (1.676 m)   Wt 56.7 kg   LMP 05/18/2020   SpO2 96%   BMI 20.18 kg/m   Physical Exam Constitutional:      Comments: Patient is alert with clear mental status.  No respiratory distress at rest.  Mild tachypnea.  Well-nourished well-developed  HENT:     Mouth/Throat:     Pharynx: Oropharynx is clear.  Eyes:     Extraocular Movements: Extraocular movements intact.  Cardiovascular:     Comments: Tachycardia.  No rub murmur gallop. Pulmonary:     Comments: Tachypnea but no significant respiratory distress.  Speaking in full sentences difficulty.  Very decreased breath sounds on the left.  Good airflow on the right.  Occasional wheeze at the lung base. Abdominal:     Comments: Trocar sites are healing well.  No drainage discharge or redness.  Abdomen soft  Musculoskeletal:        General: No swelling or tenderness. Normal range of motion.     Right lower leg: No edema.     Left lower leg: No edema.  Skin:    General: Skin is warm and dry.  Neurological:     General: No focal deficit present.     Mental Status: She is oriented  to person, place, and time.     Motor: No weakness.     Coordination: Coordination normal.  Psychiatric:        Mood and Affect: Mood normal.     ED Results / Procedures / Treatments   Labs (all labs ordered are listed, but only abnormal results are displayed) Labs Reviewed  CBC WITH DIFFERENTIAL/PLATELET - Abnormal; Notable for the following components:      Result Value   WBC 13.1 (*)    RBC  3.82 (*)    Hemoglobin 10.8 (*)    HCT 33.0 (*)    Platelets 440 (*)    Neutro Abs 9.0 (*)    Monocytes Absolute 1.1 (*)    Eosinophils Absolute 0.8 (*)    Abs Immature Granulocytes 0.20 (*)    All other components within normal limits  COMPREHENSIVE METABOLIC PANEL - Abnormal; Notable for the following components:   AST 59 (*)    ALT 54 (*)    All other components within normal limits  LIPASE, BLOOD - Abnormal; Notable for the following components:   Lipase <10 (*)    All other components within normal limits  URINALYSIS, ROUTINE W REFLEX MICROSCOPIC - Abnormal; Notable for the following components:   Color, Urine COLORLESS (*)    Specific Gravity, Urine 1.003 (*)    Hgb urine dipstick SMALL (*)    Ketones, ur 15 (*)    Bacteria, UA RARE (*)    Crystals PRESENT (*)    All other components within normal limits  CK - Abnormal; Notable for the following components:   Total CK 22 (*)    All other components within normal limits  RETICULOCYTES - Abnormal; Notable for the following components:   RBC. 3.42 (*)    Immature Retic Fract 21.3 (*)    All other components within normal limits  LACTATE DEHYDROGENASE - Abnormal; Notable for the following components:   LDH 266 (*)    All other components within normal limits  RESP PANEL BY RT-PCR (FLU A&B, COVID) ARPGX2  CULTURE, BLOOD (ROUTINE X 2)  CULTURE, BLOOD (ROUTINE X 2)  MRSA PCR SCREENING  BODY FLUID CULTURE  PROTIME-INR  LACTIC ACID, PLASMA  VITAMIN B12  FOLATE  IRON AND TIBC  FERRITIN  PROCALCITONIN  ALBUMIN, PLEURAL OR  PERITONEAL FLUID  AMYLASE, PLEURAL OR PERITONEAL FLUID   BODY FLUID CELL COUNT WITH DIFFERENTIAL  CHOLESTEROL, BODY FLUID  GLUCOSE, PLEURAL OR PERITONEAL FLUID  LACTATE DEHYDROGENASE, PLEURAL OR PERITONEAL FLUID  PH, BODY FLUID  PROTEIN, PLEURAL OR PERITONEAL FLUID  TRIGLYCERIDES, BODY FLUIDS  HIV ANTIBODY (ROUTINE TESTING W REFLEX)  MAGNESIUM  PHOSPHORUS  CBC WITH DIFFERENTIAL/PLATELET  TSH  COMPREHENSIVE METABOLIC PANEL  CYTOLOGY - NON PAP  TROPONIN I (HIGH SENSITIVITY)  TROPONIN I (HIGH SENSITIVITY)    EKG None EKG will not import from use.  Sinus tachycardia otherwise normal.  No old comparison Radiology CT Angio Chest PE W and/or Wo Contrast  Result Date: 08/20/2020 CLINICAL DATA:  Recent appendectomy.  Chest pain EXAM: CT ANGIOGRAPHY CHEST CT ABDOMEN AND PELVIS WITH CONTRAST TECHNIQUE: Multidetector CT imaging of the chest was performed using the standard protocol during bolus administration of intravenous contrast. Multiplanar CT image reconstructions and MIPs were obtained to evaluate the vascular anatomy. Multidetector CT imaging of the abdomen and pelvis was performed using the standard protocol during bolus administration of intravenous contrast. CONTRAST:  OMNIPAQUE IOHEXOL 350 MG/ML SOLN COMPARISON:  CT abdomen pelvis 06/10/2020 FINDINGS: CTA CHEST FINDINGS Cardiovascular: No filling defects within the pulmonary arteries to suggest acute pulmonary embolism. No acute findings aorta great vessels. No pericardial fluid. Mediastinum/Nodes: No axillary or supraclavicular adenopathy. No mediastinal or hilar adenopathy. No pericardial fluid. Esophagus normal. Lungs/Pleura: New large volume LEFT pleural effusion occupying approximately 60% of the LEFT hemithorax. The LEFT pleural fluid is intermediate density ranging size from HU from 20-25. There is complete passive atelectasis of the LEFT lower lobe. Atelectasis of the lingula additionally. Superior LEFT upper lobe  is aerated.  Additionally, there is a small to moderate volume RIGHT pneumothorax which collects anterior Lea within the RIGHT hemithorax in the supine position as well as over the RIGHT lung apex. Estimated volume approximately 15% of RIGHT hemithorax volume. Musculoskeletal: No rib fractures. Subcutaneous gas in the anterior chest wall along the costal margin. Review of the MIP images confirms the above findings. CT ABDOMEN and PELVIS FINDINGS Hepatobiliary: No focal hepatic lesion. No biliary duct dilatation. Common bile duct is normal. Pancreas: Pancreas is normal. No ductal dilatation. No pancreatic inflammation. Spleen: Low-density cysts within the spleen appears benign Adrenals/urinary tract: Adrenal glands and kidneys appear normal. Ureters and bladder normal. Stomach/Bowel: Stomach small normal. No complication LEFT lower quadrant related to recent appendectomy. The ascending, transverse and descending colon normal. Vascular/Lymphatic: Abdominal aorta is normal caliber. No periportal or retroperitoneal adenopathy. No pelvic adenopathy. Reproductive: A post hysterectomy. Small amount free fluid the pelvis. Abscess formation in the pelvis. No intraperitoneal free air. Other: extensive subcutaneous gas along the anterior abdominal and chest wall extending from the suprapubic region to the costal margin. Subcutaneous gas seen well on coronal image 57/series 10 Musculoskeletal: No aggressive osseous lesion.  No traumatic injury. Review of the MIP images confirms the above findings. IMPRESSION: Chest Impression: 1. Large volume intermediate density LEFT pleural effusion occupying approximately 60% of the LEFT hemithorax volume. Complete passive atelectasis of the LEFT lower lobe and lingula. 2. Small to moderate volume RIGHT pneumothorax collects anteriorly within the RIGHT hemithorax. 3. Subcutaneous gas extends along the LEFT and RIGHT chest wall up to level of the breasts. 4. No evidence acute pulmonary  embolism. Abdomen / Pelvis Impression: 1. Subcutaneous gas extends along the abdominal wall from the pubic symphysis to the costal margin on the LEFT and RIGHT. 2. No complicating features within the peritoneal space related to the appendectomy and hysterectomy. Small amount of free fluid the pelvis as expected. No organized fluid collections. No abscess. No intraperitoneal free air. Critical Value/emergent results were called by telephone at the time of interpretation on 08/20/2020 at 5:29 pm to provider Pifer, MD, who verbally acknowledged these results. Electronically Signed   By: Genevive Bi M.D.   On: 08/20/2020 17:32   CT ABDOMEN PELVIS W CONTRAST  Result Date: 08/20/2020 CLINICAL DATA:  Recent appendectomy.  Chest pain EXAM: CT ANGIOGRAPHY CHEST CT ABDOMEN AND PELVIS WITH CONTRAST TECHNIQUE: Multidetector CT imaging of the chest was performed using the standard protocol during bolus administration of intravenous contrast. Multiplanar CT image reconstructions and MIPs were obtained to evaluate the vascular anatomy. Multidetector CT imaging of the abdomen and pelvis was performed using the standard protocol during bolus administration of intravenous contrast. CONTRAST:  OMNIPAQUE IOHEXOL 350 MG/ML SOLN COMPARISON:  CT abdomen pelvis 06/10/2020 FINDINGS: CTA CHEST FINDINGS Cardiovascular: No filling defects within the pulmonary arteries to suggest acute pulmonary embolism. No acute findings aorta great vessels. No pericardial fluid. Mediastinum/Nodes: No axillary or supraclavicular adenopathy. No mediastinal or hilar adenopathy. No pericardial fluid. Esophagus normal. Lungs/Pleura: New large volume LEFT pleural effusion occupying approximately 60% of the LEFT hemithorax. The LEFT pleural fluid is intermediate density ranging size from HU from 20-25. There is complete passive atelectasis of the LEFT lower lobe. Atelectasis of the lingula additionally. Superior LEFT upper lobe is aerated. Additionally,  there is a small to moderate volume RIGHT pneumothorax which collects anterior Lea within the RIGHT hemithorax in the supine position as well as over the RIGHT lung apex. Estimated volume approximately 15% of RIGHT hemithorax volume. Musculoskeletal:  No rib fractures. Subcutaneous gas in the anterior chest wall along the costal margin. Review of the MIP images confirms the above findings. CT ABDOMEN and PELVIS FINDINGS Hepatobiliary: No focal hepatic lesion. No biliary duct dilatation. Common bile duct is normal. Pancreas: Pancreas is normal. No ductal dilatation. No pancreatic inflammation. Spleen: Low-density cysts within the spleen appears benign Adrenals/urinary tract: Adrenal glands and kidneys appear normal. Ureters and bladder normal. Stomach/Bowel: Stomach small normal. No complication LEFT lower quadrant related to recent appendectomy. The ascending, transverse and descending colon normal. Vascular/Lymphatic: Abdominal aorta is normal caliber. No periportal or retroperitoneal adenopathy. No pelvic adenopathy. Reproductive: A post hysterectomy. Small amount free fluid the pelvis. Abscess formation in the pelvis. No intraperitoneal free air. Other: extensive subcutaneous gas along the anterior abdominal and chest wall extending from the suprapubic region to the costal margin. Subcutaneous gas seen well on coronal image 57/series 10 Musculoskeletal: No aggressive osseous lesion.  No traumatic injury. Review of the MIP images confirms the above findings. IMPRESSION: Chest Impression: 1. Large volume intermediate density LEFT pleural effusion occupying approximately 60% of the LEFT hemithorax volume. Complete passive atelectasis of the LEFT lower lobe and lingula. 2. Small to moderate volume RIGHT pneumothorax collects anteriorly within the RIGHT hemithorax. 3. Subcutaneous gas extends along the LEFT and RIGHT chest wall up to level of the breasts. 4. No evidence acute pulmonary embolism. Abdomen / Pelvis  Impression: 1. Subcutaneous gas extends along the abdominal wall from the pubic symphysis to the costal margin on the LEFT and RIGHT. 2. No complicating features within the peritoneal space related to the appendectomy and hysterectomy. Small amount of free fluid the pelvis as expected. No organized fluid collections. No abscess. No intraperitoneal free air. Critical Value/emergent results were called by telephone at the time of interpretation on 08/20/2020 at 5:29 pm to provider Pifer, MD, who verbally acknowledged these results. Electronically Signed   By: Genevive Bi M.D.   On: 08/20/2020 17:32    Procedures Procedures  CRITICAL CARE Performed by: Arby Barrette   Total critical care time: 30 minutes  Critical care time was exclusive of separately billable procedures and treating other patients.  Critical care was necessary to treat or prevent imminent or life-threatening deterioration.  Critical care was time spent personally by me on the following activities: development of treatment plan with patient and/or surrogate as well as nursing, discussions with consultants, evaluation of patient's response to treatment, examination of patient, obtaining history from patient or surrogate, ordering and performing treatments and interventions, ordering and review of laboratory studies, ordering and review of radiographic studies, pulse oximetry and re-evaluation of patient's condition. Medications Ordered in ED Medications  vancomycin (VANCOREADY) IVPB 750 mg/150 mL (has no administration in time range)  lactated ringers infusion ( Intravenous New Bag/Given 08/20/20 2211)  clonazePAM (KLONOPIN) tablet 0.5 mg (0.5 mg Oral Given 08/20/20 2318)  0.9 %  sodium chloride infusion (0 mL/hr Intravenous Hold 08/20/20 2252)  acetaminophen (TYLENOL) tablet 650 mg (has no administration in time range)    Or  acetaminophen (TYLENOL) suppository 650 mg (has no administration in time range)   HYDROcodone-acetaminophen (NORCO/VICODIN) 5-325 MG per tablet 1-2 tablet (has no administration in time range)  ceFEPIme (MAXIPIME) 2 g in sodium chloride 0.9 % 100 mL IVPB (has no administration in time range)  ondansetron (ZOFRAN) injection 4 mg (4 mg Intravenous Given 08/20/20 2317)  iohexol (OMNIPAQUE) 350 MG/ML injection 100 mL (100 mLs Intravenous Contrast Given 08/20/20 1640)  ceFEPIme (MAXIPIME) 2 g in  sodium chloride 0.9 % 100 mL IVPB ( Intravenous Stopped 08/20/20 2046)  vancomycin (VANCOCIN) IVPB 1000 mg/200 mL premix (0 mg Intravenous Stopped 08/20/20 2253)  lactated ringers bolus 1,000 mL (0 mLs Intravenous Stopped 08/20/20 2205)  acetaminophen (TYLENOL) tablet 1,000 mg (1,000 mg Oral Given 08/20/20 2010)    ED Course  I have reviewed the triage vital signs and the nursing notes.  Pertinent labs & imaging results that were available during my care of the patient were reviewed by me and considered in my medical decision making (see chart for details).  Clinical Course as of 08/21/20 0005  Sat Aug 20, 2020  1748 Consult: Reviewed with Dr. Dorris FetchHendrickson cardiothoracic surgery.  Request patient be admitted to hospitalist service and transferred expeditiously [facility]. [MP]    Clinical Course User Index [MP] Arby BarrettePfeiffer, Naphtali Zywicki, MD   MDM Rules/Calculators/A&P                          Patient presents as outlined.  She had a complicated postoperative course after hysterectomy, partial bowel resection and appendectomy for endometriosis.  She has now developed a fairly large left pleural effusion and a small right spontaneous pneumothorax.  Consideration given to a pneumonia with pleural effusion in the postoperative setting from pelvic surgery.  Empiric antibiotics initiated.  At this time, pneumothorax is stable and does not require acute chest tube placement.  Consultation made with cardiothoracic surgery who [facility].  Reviewed with Dr. Adela Glimpseoutova for  admission however at this time will transfer urgently [facility] department with Dr. Jeraldine LootsLockwood excepting for transfer. Final Clinical Impression(s) / ED Diagnoses Final diagnoses:  Pleural effusion  Spontaneous pneumothorax    Rx / DC Orders ED Discharge Orders    None       Arby BarrettePfeiffer, Reiley Keisler, MD 08/21/20 0008

## 2020-08-20 NOTE — Progress Notes (Addendum)
Pharmacy Antibiotic Note  Yvonne Garcia is a 35 y.o. female admitted on 08/20/2020 with pneumonia.  Pharmacy has been consulted for vancomycin dosing.  Cefepime per MD  Plan: Vancomycin 1000 mg IV x 1, then 750 mg IV every 12 hours (eAUC 483, Goal AUC 400-550, SCr used 0.8) Add MRSA PCR Monitor renal function, Cx/PCR to narrow Vancomycin levels as indicated  Height: 5\' 6"  (167.6 cm) Weight: 56.7 kg (125 lb) IBW/kg (Calculated) : 59.3  Temp (24hrs), Avg:98.4 F (36.9 C), Min:98.4 F (36.9 C), Max:98.4 F (36.9 C)  Recent Labs  Lab 08/20/20 1502  WBC 13.1*  CREATININE 0.49    Estimated Creatinine Clearance: 86.2 mL/min (by C-G formula based on SCr of 0.49 mg/dL).    No Known Allergies  10/20/20, PharmD Clinical Pharmacist ED Pharmacist Phone # [phone] 08/20/2020 6:45 PM  Addum:  Cefepime per pharmacy.  Cefepime 2gm IV q8

## 2020-08-20 NOTE — ED Triage Notes (Signed)
She states she underwent laparoscopic surgery in [place] some three weeks ago that involved the removal of significant endometriosis and included a bowel resection and appendectomy. She states she is here today with c/o "sharp" pain just under left breast which at times radiates to left shoulder. She tells me the pain is "worse chenever I try to take a deep breath". Her skin is normal, warm and dry. EKG performed upon arrival.

## 2020-08-20 NOTE — H&P (Signed)
SOLE LENGACHER DXI:[id number] DOB: 1985-11-09 DOA: 08/20/2020     PCP: Donald Prose, MD   Outpatient Specialists:     Surgery Dr. Simone Curia. [facility]) Patient arrived to ER on 08/20/20 at 1419 Referred by Attending Carmin Muskrat, MD   Patient coming from: home Lives alone,      Chief Complaint:   Chief Complaint  Patient presents with  . Chest Pain    HPI: Yvonne Garcia is a 35 y.o. female with medical history significant of endometriosis, and anxiety, iron deficiency anemia    Presented with  Sharp CP under left breast going to left shoulder, pleuretic Recent (10 days ago) laparoscopic surgery in [place] with removal of significant endometriosis and included a bowel resection and appendectomy. Hospital stay was complicated by anemia requiring blood transfusion and prolonged hospital stay Reports low grade fever at home and dyspnea with exertion. Infectious risk factors:  Reports fever, shortness of breath,  chest pain,      Has   been vaccinated against COVID and boosted   Initial COVID TEST   in house  PCR testing  Pending  No results found for: SARSCOV2NAA   Regarding pertinent Chronic problems:        Chronic anemia - baseline hg Hemoglobin & Hematocrit  Recent Labs    04/13/20 1255 08/20/20 1502  HGB 11.9* 10.8*    While in ER: Initially was found to be tachycardic was concerned for PE order CTA Noted to have elevated white blood cell count 13.1  Also mildly elevated LFTs 50's CT showed evidence of new pleural effusion as well as small pneumo Was given a dose of Vanco and cefepime and transferred to [facility] ED Triage Vitals  Enc Vitals Group     BP 08/20/20 1430 120/73     Pulse Rate 08/20/20 1430 (!) 101     Resp 08/20/20 1430 (!) 24     Temp 08/20/20 1430 98.4 F (36.9 C)     Temp Source 08/20/20 1430 Oral     SpO2 08/20/20 1430 100 %     Weight 08/20/20 1451 125 lb (56.7 kg)     Height 08/20/20 1451 _0  (1.676 m)     Head Circumference  --      Peak Flow --      Pain Score 08/20/20 1448 7     Pain Loc --      Pain Edu? --      Excl. in [place]? --   TMAX(24)@     _________________________________________ Significant initial  Findings: Abnormal Labs Reviewed  CBC WITH DIFFERENTIAL/PLATELET - Abnormal; Notable for the following components:      Result Value   WBC 13.1 (*)    RBC 3.82 (*)    Hemoglobin 10.8 (*)    HCT 33.0 (*)    Platelets 440 (*)    Neutro Abs 9.0 (*)    Monocytes Absolute 1.1 (*)    Eosinophils Absolute 0.8 (*)    Abs Immature Granulocytes 0.20 (*)    All other components within normal limits  COMPREHENSIVE METABOLIC PANEL - Abnormal; Notable for the following components:   AST 59 (*)    ALT 54 (*)    All other components within normal limits  LIPASE, BLOOD - Abnormal; Notable for the following components:   Lipase <10 (*)    All other components within normal limits  URINALYSIS, ROUTINE W REFLEX MICROSCOPIC - Abnormal; Notable for the following components:   Color, Urine COLORLESS (*)  Specific Gravity, Urine 1.003 (*)    Hgb urine dipstick SMALL (*)    Ketones, ur 15 (*)    Bacteria, UA RARE (*)    Crystals PRESENT (*)    All other components within normal limits   ____________________________________________ Ordered   CTabd/pelvis -  nonacute  CTA chest -large volume 60% left hemithorax pleural effusion with atelectasis On the right small to moderate right pneumothorax  no PE,       ECG: Ordered Personally reviewed by me showing: HR :99 Rhythm: NSR,    no evidence of ischemic changes QTC 437 ____________________ This patient meets SIRS Criteria    The recent clinical data is shown below. Vitals:   08/20/20 2000 08/20/20 2015 08/20/20 2105 08/20/20 2115  BP: 115/71 119/75 112/70 106/68  Pulse: (!) 111 (!) 112 (!) 113 (!) 105  Resp: (!) 22 (!) 25 (!) 24 16  Temp:  99.9 F (37.7 C)    TempSrc:  Oral    SpO2: 99% 98% 98% 99%  Weight:      Height:        WBC      Component Value Date/Time   WBC 13.1 (H) 08/20/2020 1502   LYMPHSABS 2.0 08/20/2020 1502   MONOABS 1.1 (H) 08/20/2020 1502   EOSABS 0.8 (H) 08/20/2020 1502   BASOSABS 0.1 08/20/2020 1502    Lactic Acid, Venous    Component Value Date/Time   LATICACIDVEN 0.7 08/20/2020 1751      UA   no evidence of UTI      Urine analysis:    Component Value Date/Time   COLORURINE COLORLESS (A) 08/20/2020 1502   APPEARANCEUR CLEAR 08/20/2020 1502   LABSPEC 1.003 (L) 08/20/2020 1502   PHURINE 5.5 08/20/2020 1502   GLUCOSEU NEGATIVE 08/20/2020 1502   HGBUR SMALL (A) 08/20/2020 1502   BILIRUBINUR NEGATIVE 08/20/2020 1502   KETONESUR 15 (A) 08/20/2020 1502   PROTEINUR NEGATIVE 08/20/2020 1502   NITRITE NEGATIVE 08/20/2020 1502   LEUKOCYTESUR NEGATIVE 08/20/2020 1502    No results found for this or any previous visit.   _______________________________________________________ ER Provider Called:    CT surgery Dr. Roxan Hockey.   They Recommend admit to medicine  And thoracentesis in AM then will go from there  ____________________ Hospitalist was called for admission for new pleural effusion and pneumothorax  The following Work up has been ordered so far:  Orders Placed This Encounter  Procedures  . Culture, blood (routine x 2)  . Resp Panel by RT-PCR (Flu A&B, Covid) Nasopharyngeal Swab  . MRSA PCR Screening  . CT Angio Chest PE W and/or Wo Contrast  . CT ABDOMEN PELVIS W CONTRAST  . CBC with Differential  . Comprehensive metabolic panel  . Lipase, blood  . Urinalysis, Routine w reflex microscopic  . Protime-INR  . Nursing communication  . Consult to cardiothoracic Surgery  . Consult to hospitalist  . vancomycin per pharmacy consult  . Oxygen therapy Mode or (Route): Nasal cannula; FiO2: 21%; Liters Per Minute: 2  . ED EKG     Following Medications were ordered in ER: Medications  vancomycin (VANCOCIN) IVPB 1000 mg/200 mL premix (has no administration in time range)   vancomycin (VANCOREADY) IVPB 750 mg/150 mL (has no administration in time range)  lactated ringers infusion (has no administration in time range)  iohexol (OMNIPAQUE) 350 MG/ML injection 100 mL (100 mLs Intravenous Contrast Given 08/20/20 1640)  ceFEPIme (MAXIPIME) 2 g in sodium chloride 0.9 % 100 mL IVPB ( Intravenous  Stopped 08/20/20 2046)  lactated ringers bolus 1,000 mL ( Intravenous Infusion Verify 08/20/20 2111)  acetaminophen (TYLENOL) tablet 1,000 mg (1,000 mg Oral Given 08/20/20 2010)        Consult Orders  (From admission, onward)         Start     Ordered   08/20/20 1748  Consult to hospitalist  Spoke to [place] at [place]  Once       Provider:  (Not yet assigned)  Question Answer Comment  Place call to: [facility]   Reason for Consult Admit      08/20/20 1747          OTHER Significant initial  Findings:  labs showing:    Recent Labs  Lab 08/20/20 1502  NA 136  K 4.0  CO2 24  GLUCOSE 93  BUN 8  CREATININE 0.49  CALCIUM 8.9    Cr   stable,    Lab Results  Component Value Date   CREATININE 0.49 08/20/2020   CREATININE 0.73 04/13/2020    Recent Labs  Lab 08/20/20 1502  AST 59*  ALT 54*  ALKPHOS 63  BILITOT 1.0  PROT 7.0  ALBUMIN 3.8   Lab Results  Component Value Date   CALCIUM 8.9 08/20/2020   Plt: Lab Results  Component Value Date   PLT 440 (H) 08/20/2020       Recent Labs  Lab 08/20/20 1502  WBC 13.1*  NEUTROABS 9.0*  HGB 10.8*  HCT 33.0*  MCV 86.4  PLT 440*    HG/HCT  stable,       Component Value Date/Time   HGB 10.8 (L) 08/20/2020 1502   HGB 11.9 (L) 04/13/2020 1255   HCT 33.0 (L) 08/20/2020 1502   MCV 86.4 08/20/2020 1502     Recent Labs  Lab 08/20/20 1502  LIPASE <10*        Cultures: No results found for: SDES, SPECREQUEST, CULT, REPTSTATUS   Radiological Exams on Admission: CT Angio Chest PE W and/or Wo Contrast  Result Date: 08/20/2020 CLINICAL DATA:  Recent appendectomy.  Chest pain EXAM: CT  ANGIOGRAPHY CHEST CT ABDOMEN AND PELVIS WITH CONTRAST TECHNIQUE: Multidetector CT imaging of the chest was performed using the standard protocol during bolus administration of intravenous contrast. Multiplanar CT image reconstructions and MIPs were obtained to evaluate the vascular anatomy. Multidetector CT imaging of the abdomen and pelvis was performed using the standard protocol during bolus administration of intravenous contrast. CONTRAST:  15m OMNIPAQUE IOHEXOL 350 MG/ML SOLN COMPARISON:  CT abdomen pelvis 06/10/2020 FINDINGS: CTA CHEST FINDINGS Cardiovascular: No filling defects within the pulmonary arteries to suggest acute pulmonary embolism. No acute findings aorta great vessels. No pericardial fluid. Mediastinum/Nodes: No axillary or supraclavicular adenopathy. No mediastinal or hilar adenopathy. No pericardial fluid. Esophagus normal. Lungs/Pleura: New large volume LEFT pleural effusion occupying approximately 60% of the LEFT hemithorax. The LEFT pleural fluid is intermediate density ranging size from HU from 20-25. There is complete passive atelectasis of the LEFT lower lobe. Atelectasis of the lingula additionally. Superior LEFT upper lobe is aerated. Additionally, there is a small to moderate volume RIGHT pneumothorax which collects anterior Lea within the RIGHT hemithorax in the supine position as well as over the RIGHT lung apex. Estimated volume approximately 15% of RIGHT hemithorax volume. Musculoskeletal: No rib fractures. Subcutaneous gas in the anterior chest wall along the costal margin. Review of the MIP images confirms the above findings. CT ABDOMEN and PELVIS FINDINGS Hepatobiliary: No focal hepatic lesion. No  biliary duct dilatation. Common bile duct is normal. Pancreas: Pancreas is normal. No ductal dilatation. No pancreatic inflammation. Spleen: Low-density cysts within the spleen appears benign Adrenals/urinary tract: Adrenal glands and kidneys appear normal. Ureters and bladder  normal. Stomach/Bowel: Stomach small normal. No complication LEFT lower quadrant related to recent appendectomy. The ascending, transverse and descending colon normal. Vascular/Lymphatic: Abdominal aorta is normal caliber. No periportal or retroperitoneal adenopathy. No pelvic adenopathy. Reproductive: A post hysterectomy. Small amount free fluid the pelvis. Abscess formation in the pelvis. No intraperitoneal free air. Other: extensive subcutaneous gas along the anterior abdominal and chest wall extending from the suprapubic region to the costal margin. Subcutaneous gas seen well on coronal image 57/series 10 Musculoskeletal: No aggressive osseous lesion.  No traumatic injury. Review of the MIP images confirms the above findings. IMPRESSION: Chest Impression: 1. Large volume intermediate density LEFT pleural effusion occupying approximately 60% of the LEFT hemithorax volume. Complete passive atelectasis of the LEFT lower lobe and lingula. 2. Small to moderate volume RIGHT pneumothorax collects anteriorly within the RIGHT hemithorax. 3. Subcutaneous gas extends along the LEFT and RIGHT chest wall up to level of the breasts. 4. No evidence acute pulmonary embolism. Abdomen / Pelvis Impression: 1. Subcutaneous gas extends along the abdominal wall from the pubic symphysis to the costal margin on the LEFT and RIGHT. 2. No complicating features within the peritoneal space related to the appendectomy and hysterectomy. Small amount of free fluid the pelvis as expected. No organized fluid collections. No abscess. No intraperitoneal free air. Critical Value/emergent results were called by telephone at the time of interpretation on 08/20/2020 at 5:29 pm to provider Pifer, MD, who verbally acknowledged these results. Electronically Signed   By: Suzy Bouchard M.D.   On: 08/20/2020 17:32   CT ABDOMEN PELVIS W CONTRAST  Result Date: 08/20/2020 CLINICAL DATA:  Recent appendectomy.  Chest pain EXAM: CT ANGIOGRAPHY CHEST CT  ABDOMEN AND PELVIS WITH CONTRAST TECHNIQUE: Multidetector CT imaging of the chest was performed using the standard protocol during bolus administration of intravenous contrast. Multiplanar CT image reconstructions and MIPs were obtained to evaluate the vascular anatomy. Multidetector CT imaging of the abdomen and pelvis was performed using the standard protocol during bolus administration of intravenous contrast. CONTRAST:  174m OMNIPAQUE IOHEXOL 350 MG/ML SOLN COMPARISON:  CT abdomen pelvis 06/10/2020 FINDINGS: CTA CHEST FINDINGS Cardiovascular: No filling defects within the pulmonary arteries to suggest acute pulmonary embolism. No acute findings aorta great vessels. No pericardial fluid. Mediastinum/Nodes: No axillary or supraclavicular adenopathy. No mediastinal or hilar adenopathy. No pericardial fluid. Esophagus normal. Lungs/Pleura: New large volume LEFT pleural effusion occupying approximately 60% of the LEFT hemithorax. The LEFT pleural fluid is intermediate density ranging size from HU from 20-25. There is complete passive atelectasis of the LEFT lower lobe. Atelectasis of the lingula additionally. Superior LEFT upper lobe is aerated. Additionally, there is a small to moderate volume RIGHT pneumothorax which collects anterior Lea within the RIGHT hemithorax in the supine position as well as over the RIGHT lung apex. Estimated volume approximately 15% of RIGHT hemithorax volume. Musculoskeletal: No rib fractures. Subcutaneous gas in the anterior chest wall along the costal margin. Review of the MIP images confirms the above findings. CT ABDOMEN and PELVIS FINDINGS Hepatobiliary: No focal hepatic lesion. No biliary duct dilatation. Common bile duct is normal. Pancreas: Pancreas is normal. No ductal dilatation. No pancreatic inflammation. Spleen: Low-density cysts within the spleen appears benign Adrenals/urinary tract: Adrenal glands and kidneys appear normal. Ureters and bladder normal. Stomach/Bowel:  Stomach small normal. No complication LEFT lower quadrant related to recent appendectomy. The ascending, transverse and descending colon normal. Vascular/Lymphatic: Abdominal aorta is normal caliber. No periportal or retroperitoneal adenopathy. No pelvic adenopathy. Reproductive: A post hysterectomy. Small amount free fluid the pelvis. Abscess formation in the pelvis. No intraperitoneal free air. Other: extensive subcutaneous gas along the anterior abdominal and chest wall extending from the suprapubic region to the costal margin. Subcutaneous gas seen well on coronal image 57/series 10 Musculoskeletal: No aggressive osseous lesion.  No traumatic injury. Review of the MIP images confirms the above findings. IMPRESSION: Chest Impression: 1. Large volume intermediate density LEFT pleural effusion occupying approximately 60% of the LEFT hemithorax volume. Complete passive atelectasis of the LEFT lower lobe and lingula. 2. Small to moderate volume RIGHT pneumothorax collects anteriorly within the RIGHT hemithorax. 3. Subcutaneous gas extends along the LEFT and RIGHT chest wall up to level of the breasts. 4. No evidence acute pulmonary embolism. Abdomen / Pelvis Impression: 1. Subcutaneous gas extends along the abdominal wall from the pubic symphysis to the costal margin on the LEFT and RIGHT. 2. No complicating features within the peritoneal space related to the appendectomy and hysterectomy. Small amount of free fluid the pelvis as expected. No organized fluid collections. No abscess. No intraperitoneal free air. Critical Value/emergent results were called by telephone at the time of interpretation on 08/20/2020 at 5:29 pm to provider Pifer, MD, who verbally acknowledged these results. Electronically Signed   By: Suzy Bouchard M.D.   On: 08/20/2020 17:32   _______________________________________________________________________________________________________ Latest  Blood pressure 106/68, pulse (!) 105, temperature  99.9 F (37.7 C), temperature source Oral, resp. rate 16, height _0  (1.676 m), weight 56.7 kg, last menstrual period 05/18/2020, SpO2 99 %.   Review of Systems:    Pertinent positives include   fatigue,chest pain,  Constitutional:  No weight loss, night sweats, Fevers, chills, weight loss  HEENT:  No headaches, Difficulty swallowing,Tooth/dental problems,Sore throat,  No sneezing, itching, ear ache, nasal congestion, post nasal drip,  Cardio-vascular:  No  Orthopnea, PND, anasarca, dizziness, palpitations.no Bilateral lower extremity swelling  GI:  No heartburn, indigestion, abdominal pain, nausea, vomiting, diarrhea, change in bowel habits, loss of appetite, melena, blood in stool, hematemesis Resp:  no shortness of breath at rest. No dyspnea on exertion, No excess mucus, no productive cough, No non-productive cough, No coughing up of blood.No change in color of mucus.No wheezing. Skin:  no rash or lesions. No jaundice GU:  no dysuria, change in color of urine, no urgency or frequency. No straining to urinate.  No flank pain.  Musculoskeletal:  No joint pain or no joint swelling. No decreased range of motion. No back pain.  Psych:  No change in mood or affect. No depression or anxiety. No memory loss.  Neuro: no localizing neurological complaints, no tingling, no weakness, no double vision, no gait abnormality, no slurred speech, no confusion  All systems reviewed and apart from [place] all are negative _______________________________________________________________________________________________ Past Medical History:   Past Medical History:  Diagnosis Date  . Allergies   . Anxiety   . Family history of breast cancer 05/05/2020  . Family history of colon cancer 05/05/2020  . Family history of kidney cancer 05/05/2020  . Family history of pancreatic cancer 05/05/2020  . Internal hemorrhoids       Past Surgical History:  Procedure Laterality Date  . ABDOMINAL  HYSTERECTOMY    . APPENDECTOMY    . LAPAROSCOPIC BOWEL RESECTION    .  TONSILLECTOMY    . WISDOM TOOTH EXTRACTION      Social History:  Ambulatory  independently       reports that she has never smoked. She has never used smokeless tobacco. She reports current alcohol use. She reports that she does not use drugs.   Family History:   Family History  Problem Relation Age of Onset  . Mental illness Mother        suicide.   . Pancreatic cancer Mother 43  . Breast cancer Mother 65  . Kidney cancer Father 51  . Colon cancer Maternal Grandfather        dx 63s  . Kidney cancer Paternal Grandfather        dx unknown age   ______________________________________________________________________________________________ Allergies: No Known Allergies   Prior to Admission medications   Medication Sig Start Date End Date Taking? Authorizing Provider  clonazePAM (KLONOPIN) 0.5 MG tablet Take 1 tablet by mouth as needed. 03/11/16   [provider]  dicyclomine (BENTYL) 20 MG tablet Take 1 tablet (20 mg total) by mouth 3 (three) times daily as needed for spasms. 01/29/20   Pyrtle, Lajuan Lines, MD  Ferrous Sulfate (IRON PO) Take 1 each by mouth daily as needed.    [provider]  fluticasone (FLONASE) 50 MCG/ACT nasal spray Flonase    [provider]  vitamin B-12 (CYANOCOBALAMIN) 1000 MCG tablet Take 1,000 mcg by mouth daily.    [provider]  Vitamin D, Ergocalciferol, (DRISDOL) 1.25 MG (50000 UNIT) CAPS capsule Take by mouth. 04/05/20   [provider]    ___________________________________________________________________________________________________ Physical Exam: Vitals with BMI 08/20/2020 08/20/2020 08/20/2020  Height - - -  Weight - - -  BMI - - -  Systolic 409 735 329  Diastolic 68 70 75  Pulse 924 113 112     1. General:  in No  Acute distress    Chronically ill  -appearing 2. Psychological: Alert and   Oriented 3. Head/ENT:    Dry  Mucous Membranes                          Head Non traumatic, neck supple                        Normal  Dentition 4. SKIN:  decreased Skin turgor,  Skin clean Dry and intact no rash 5. Heart: Regular rate and rhythm no  Murmur, no Rub or gallop 6. Lungs:   no wheezes or crackles, dull decreased BS on the left 7. Abdomen: Soft,  non-tender, Non distended surgical sites well healing 8. Lower extremities: no clubbing, cyanosis, no edema 9. Neurologically Grossly intact, moving all 4 extremities equally   10. MSK: Normal range of motion    Chart has been reviewed  ______________________________________________________________________________________________  Assessment/Plan   35 y.o. female with medical history significant of endometriosis, and anxiety, iron deficiency anemia     Admitted for pneumothorax and pleural effusion  Present on Admission: . Pneumothorax -  Deffer to CT surgery, currently  stable  . Iron deficiency anemia - obtain anemia panel, no indication for blood transfusion at this time  . Pleural effusion -thoracentesis in a.m. obtain cultures pH protein and Gram stain Appreciate IR consult keep n.p.o. post midnight  . SIRS (systemic inflammatory response syndrome) (HCC) -  -SIRS criteria met with  elevated white blood cell count,       Component Value Date/Time  WBC 13.1 (H) 08/20/2020 1502   LYMPHSABS 2.0 08/20/2020 1502     tachycardia   RR >20 Today's Vitals   08/20/20 2200 08/20/20 2215 08/20/20 2219 08/20/20 2248  BP: 98/76 98/73  100/66  Pulse: (!) 110 (!) 108  (!) 114  Resp: (!) _0 Temp:   98.8 F (37.1 C) 98.6 F (37 C)  TempSrc:   Oral Oral  SpO2: 95% 94%    Weight:      Height:      PainSc:    2      This patient meets SIRS Criteria    The recent clinical data is shown below. Vitals:   08/20/20 2200 08/20/20 2215 08/20/20 2219 08/20/20 2248  BP: 98/76 98/73  100/66  Pulse: (!) 110 (!) 108  (!) 114  Resp: (!) _1 Temp:   98.8 F (37.1 C) 98.6 F (37 C)  TempSrc:   Oral Oral  SpO2: 95% 94%    Weight:      Height:         -Most likely source being: presumed  pulmonary     - Obtain serial lactic acid and procalcitonin level.  - Initiated IV antibiotics   - await results of blood and urine culture  - Rehydrate      10:59 PM    Other plan as per orders.  DVT prophylaxis:  SCD        Code Status:    Code Status: Not on file FULL CODE      Family Communication:   Family at  Bedside  plan of care was discussed on the phone with  Father   Disposition Plan:       To home once workup is complete and patient is stable   Following barriers for discharge:                                                          Pain controlled with PO medications                              white count improving able to transition to PO antibiotics                             Will need to be able to tolerate PO                                                       Will need consultants to evaluate patient prior to discharge    Consults called: Ct surgery is aware   Admission status:  ED Disposition    ED Disposition Condition [place]: [place] [[id number]]  Level of Care: Progressive [102]  Admit to Progressive based on following criteria: CARDIOVASCULAR & THORACIC of moderate stability with acute coronary syndrome symptoms/low risk myocardial infarction/hypertensive urgency/arrhythmias/heart failure potentially compromising stability and stable post cardiovascular intervention patients.  May admit patient to [facility] or [facility] if  equivalent level of care is available:: No  Covid Evaluation: Asymptomatic Screening Protocol (No Symptoms)  Diagnosis: Pneumothorax [078675]  Admitting Physician: Toy Baker [[id number]]  Attending Physician: Toy Baker [[id number]]  Estimated length of stay: past midnight tomorrow  Certification:: I certify this  patient will need inpatient services for at least 2 midnights          inpatient     I Expect 2 midnight stay secondary to severity of patient's current illness need for inpatient interventions justified by the following:    Severe lab/radiological/exam abnormalities including:    large pleural effusions and Pneumothorax .    That are currently affecting medical management.   I expect  patient to be hospitalized for 2 midnights requiring inpatient medical care.  Patient is at high risk for adverse outcome (such as loss of life or disability) if not treated.  Indication for inpatient stay as follows:    severe pain requiring acute inpatient management,  inability to maintain oral hydration   persistent chest pain despite medical management Need for operative/procedural  intervention    Need for IV antibiotics, IV fluids,     Level of care  progressive tele indefinitely please discontinue once patient no longer qualifies COVID-19 Labs    No results found for: SARSCOV2NAA   Precautions: admitted as  asymptomatic screening protocol   PPE: Used by the provider:   N95  eye Goggles,  Gloves     Laraya Pestka 08/20/2020, 10:56 PM   [facility]     after 2 AM please page floor coverage PA If 7AM-7PM, please contact the day team taking care of the patient using [email or web address]   Patient was evaluated in the context of the global COVID-19 pandemic, which necessitated consideration that the patient might be at risk for infection with the SARS-CoV-2 virus that causes COVID-19. Institutional protocols and algorithms that pertain to the evaluation of patients at risk for COVID-19 are in a state of rapid change based on information released by regulatory bodies including the CDC and federal and state organizations. These policies and algorithms were followed during the patient's care.

## 2020-08-20 NOTE — ED Provider Notes (Signed)
9:31 PM Patient has arrived from our affiliated center.  She is awake, alert, in no distress. Discussed her presentation with her, the transferring physician and the EMS transport team. No hemodynamic instability.  Patient notes that she feels about the same.  After arrival I discussed her case with our cardiothoracic surgeon, Dr. Dorris Fetch.  Recommendations for thoracentesis. I also discussed patient's case with our internal medicine team for admission.   Gerhard Munch, MD 08/20/20 2131

## 2020-08-21 ENCOUNTER — Inpatient Hospital Stay (HOSPITAL_COMMUNITY): Payer: No Typology Code available for payment source

## 2020-08-21 DIAGNOSIS — J90 Pleural effusion, not elsewhere classified: Secondary | ICD-10-CM

## 2020-08-21 DIAGNOSIS — J9383 Other pneumothorax: Secondary | ICD-10-CM

## 2020-08-21 LAB — ALBUMIN, PLEURAL OR PERITONEAL FLUID: Albumin, Fluid: 2 g/dL

## 2020-08-21 LAB — PROTEIN, PLEURAL OR PERITONEAL FLUID: Total protein, fluid: 3.5 g/dL

## 2020-08-21 LAB — COMPREHENSIVE METABOLIC PANEL
ALT: 46 U/L — ABNORMAL HIGH (ref 0–44)
AST: 37 U/L (ref 15–41)
Albumin: 2.8 g/dL — ABNORMAL LOW (ref 3.5–5.0)
Alkaline Phosphatase: 59 U/L (ref 38–126)
Anion gap: 7 (ref 5–15)
BUN: <5 mg/dL — ABNORMAL LOW (ref 6–20)
CO2: 24 mmol/L (ref 22–32)
Calcium: 8.5 mg/dL — ABNORMAL LOW (ref 8.9–10.3)
Chloride: 104 mmol/L (ref 98–111)
Creatinine, Ser: 0.54 mg/dL (ref 0.44–1.00)
GFR, Estimated: >60 mL/min (ref >60)
Glucose, Bld: 133 mg/dL — ABNORMAL HIGH (ref 70–99)
Potassium: 3.8 mmol/L (ref 3.5–5.1)
Sodium: 135 mmol/L (ref 135–145)
Total Bilirubin: 1.2 mg/dL (ref 0.3–1.2)
Total Protein: 5.9 g/dL — ABNORMAL LOW (ref 6.5–8.1)

## 2020-08-21 LAB — CBC WITH DIFFERENTIAL/PLATELET
Abs Immature Granulocytes: 0.23 10*3/uL — ABNORMAL HIGH (ref 0.00–0.07)
Basophils Absolute: 0 10*3/uL (ref 0.0–0.1)
Basophils Relative: 0 %
Eosinophils Absolute: 0.5 10*3/uL (ref 0.0–0.5)
Eosinophils Relative: 4 %
HCT: 30 % — ABNORMAL LOW (ref 36.0–46.0)
Hemoglobin: 9.9 g/dL — ABNORMAL LOW (ref 12.0–15.0)
Immature Granulocytes: 2 %
Lymphocytes Relative: 17 %
Lymphs Abs: 2.2 10*3/uL (ref 0.7–4.0)
MCH: 28.7 pg (ref 26.0–34.0)
MCHC: 33 g/dL (ref 30.0–36.0)
MCV: 87 fL (ref 80.0–100.0)
Monocytes Absolute: 1.2 10*3/uL — ABNORMAL HIGH (ref 0.1–1.0)
Monocytes Relative: 9 %
Neutro Abs: 9.1 10*3/uL — ABNORMAL HIGH (ref 1.7–7.7)
Neutrophils Relative %: 68 %
Platelets: 547 10*3/uL — ABNORMAL HIGH (ref 150–400)
RBC: 3.45 MIL/uL — ABNORMAL LOW (ref 3.87–5.11)
RDW: 14.2 % (ref 11.5–15.5)
WBC: 13.3 10*3/uL — ABNORMAL HIGH (ref 4.0–10.5)
nRBC: 0 % (ref 0.0–0.2)

## 2020-08-21 LAB — IRON AND TIBC
Iron: 17 ug/dL — ABNORMAL LOW (ref 28–170)
Saturation Ratios: 7 % — ABNORMAL LOW (ref 10.4–31.8)
TIBC: 231 ug/dL — ABNORMAL LOW (ref 250–450)
UIBC: 214 ug/dL

## 2020-08-21 LAB — PHOSPHORUS: Phosphorus: 2.7 mg/dL (ref 2.5–4.6)

## 2020-08-21 LAB — LACTATE DEHYDROGENASE, PLEURAL OR PERITONEAL FLUID: LD, Fluid: 1492 U/L — ABNORMAL HIGH (ref 3–23)

## 2020-08-21 LAB — PROCALCITONIN: Procalcitonin: <0.1 ng/mL

## 2020-08-21 LAB — SURGICAL PCR SCREEN
MRSA, PCR: NEGATIVE
Staphylococcus aureus: NEGATIVE

## 2020-08-21 LAB — HIV ANTIBODY (ROUTINE TESTING W REFLEX): HIV Screen 4th Generation wRfx: NONREACTIVE

## 2020-08-21 LAB — GLUCOSE, PLEURAL OR PERITONEAL FLUID: Glucose, Fluid: 57 mg/dL

## 2020-08-21 LAB — MAGNESIUM: Magnesium: 2.1 mg/dL (ref 1.7–2.4)

## 2020-08-21 LAB — BODY FLUID CELL COUNT WITH DIFFERENTIAL
Eos, Fluid: 0 %
Lymphs, Fluid: 33 %
Monocyte-Macrophage-Serous Fluid: 30 % — ABNORMAL LOW (ref 50–90)
Neutrophil Count, Fluid: 37 % — ABNORMAL HIGH (ref 0–25)
Total Nucleated Cell Count, Fluid: 12000 cu mm — ABNORMAL HIGH (ref 0–1000)

## 2020-08-21 LAB — FERRITIN: Ferritin: 288 ng/mL (ref 11–307)

## 2020-08-21 LAB — VITAMIN B12: Vitamin B-12: 691 pg/mL (ref 180–914)

## 2020-08-21 LAB — FOLATE: Folate: 17.9 ng/mL (ref >5.9)

## 2020-08-21 LAB — TSH: TSH: 2.513 u[IU]/mL (ref 0.350–4.500)

## 2020-08-21 IMAGING — US US THORACENTESIS ASP PLEURAL SPACE W/IMG GUIDE
1 series · 3 of 3 positions shown · non-contrast
Comparison: none

INDICATION: Patient with a history of recent laparoscopic surgery for
endometriosis, bowel resection and appendectomy presents today with
left pleural effusion. Interventional radiology asked to perform a
therapeutic and diagnostic thoracentesis.

[Series 1: us thoracentesis asp pleural space w/img guide · 3 of 3 slices shown]
[im 1/3]
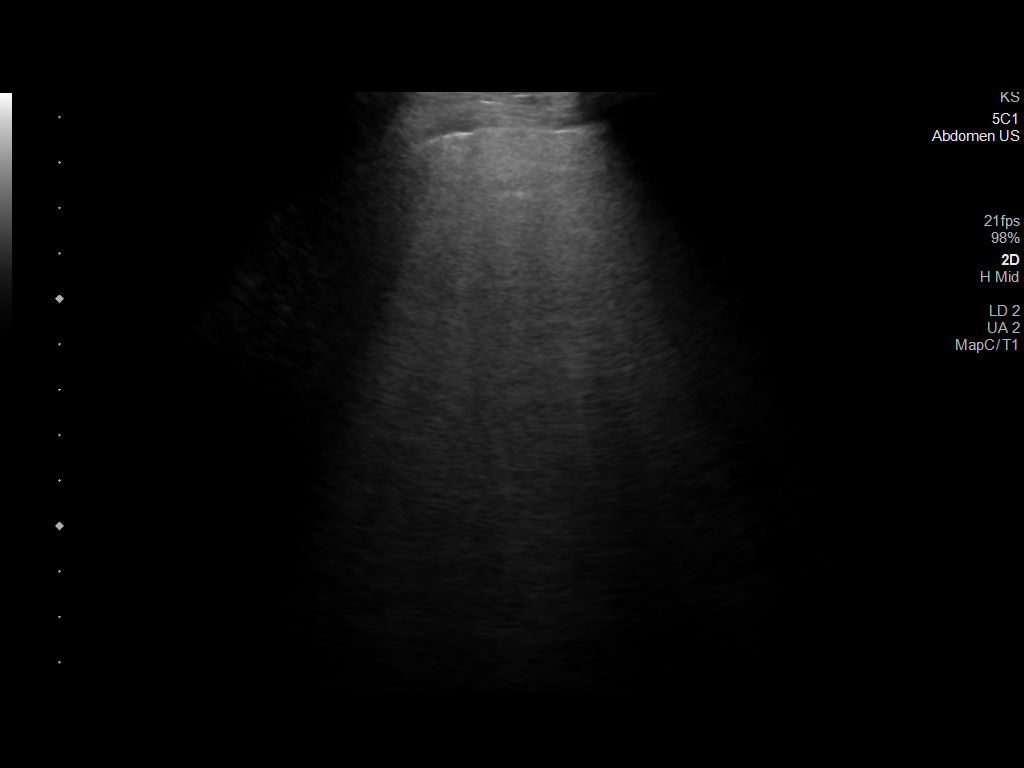
[im 2/3]
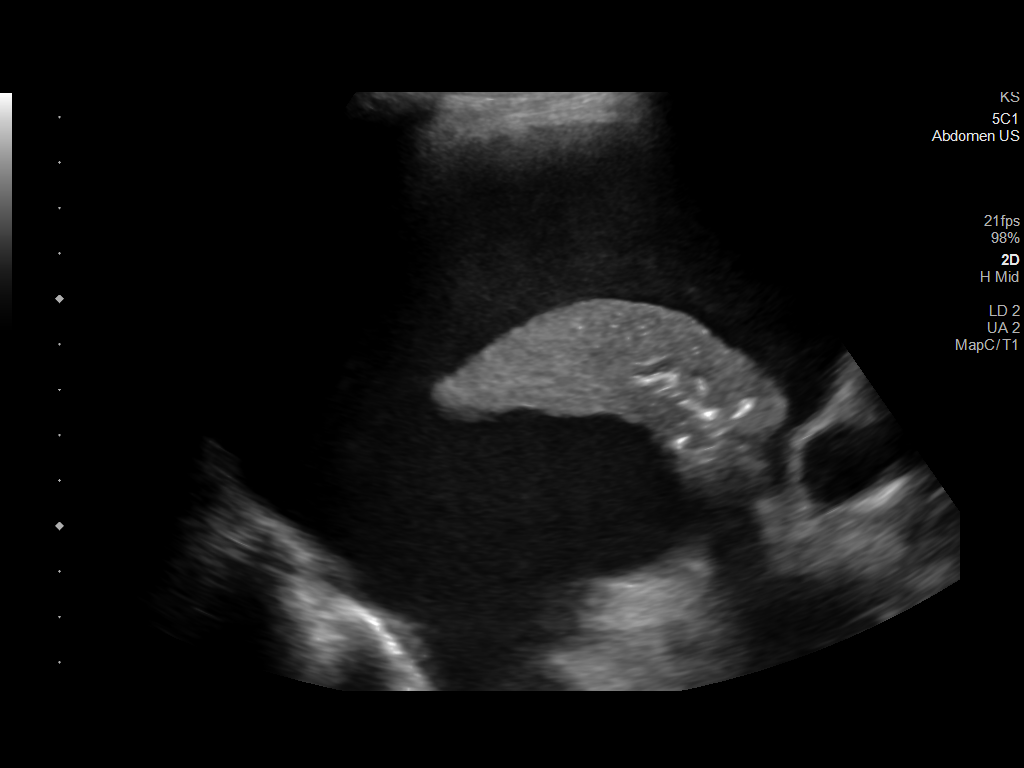
[im 3/3]
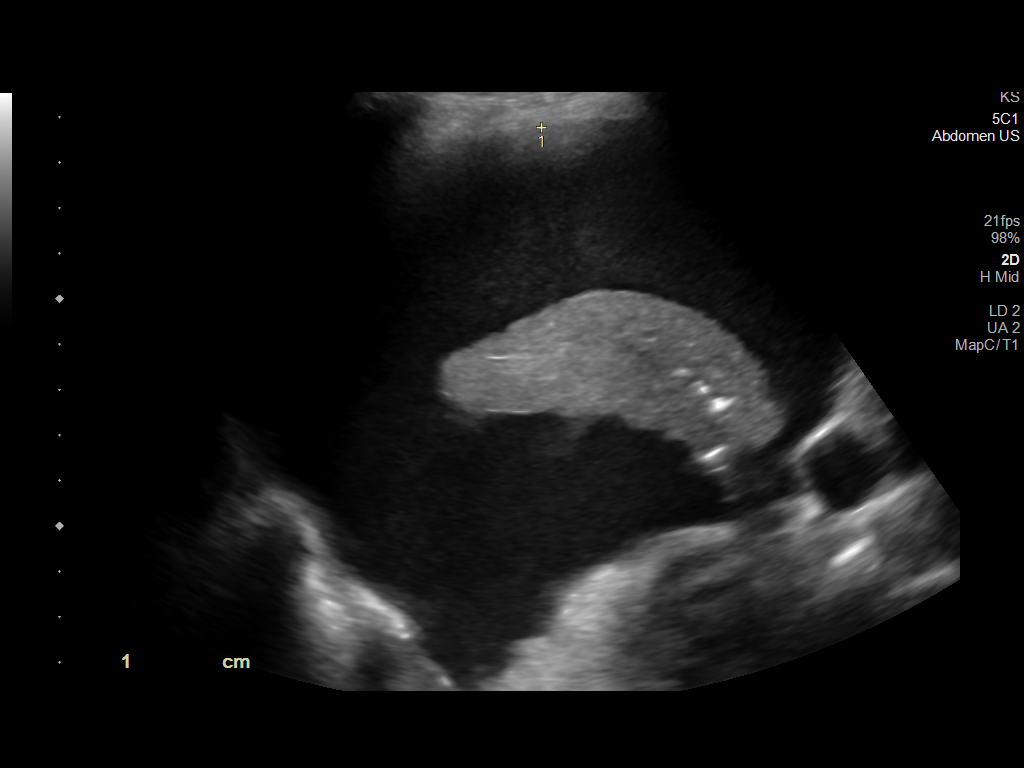

[3 of 3 positions shown; findings below may reference images not displayed]

EXAM:
ULTRASOUND GUIDED THORACENTESIS

MEDICATIONS:
1% lidocaine 10 mL

COMPLICATIONS:
None immediate.

PROCEDURE:
An ultrasound guided thoracentesis was thoroughly discussed with the
patient and questions answered. The benefits, risks, alternatives
and complications were also discussed. The patient understands and
wishes to proceed with the procedure. Written consent was obtained.

Ultrasound was performed to localize and mark an adequate pocket of
fluid in the left chest. The area was then prepped and draped in the
normal sterile fashion. 1% Lidocaine was used for local anesthesia.
Under ultrasound guidance a 6 Fr Safe-T-Centesis catheter was
introduced. Thoracentesis was performed. The catheter was removed
and a dressing applied.
FINDINGS: A total of approximately 1.4 L of bloody fluid was removed. Samples
were sent to the laboratory as requested by the clinical team.
IMPRESSION: Successful ultrasound guided left thoracentesis yielding 1.4 L of
pleural fluid. Read by: SIPE, NP

## 2020-08-21 IMAGING — DX DG CHEST 1V
1 series · 1 of 1 positions shown · non-contrast
Comparison: CTA chest yesterday.
COMPARISON: CTA chest yesterday.

Addendum:
CLINICAL DATA: 35-year-old female status post ultrasound-guided
left side thoracentesis this morning.

EXAM:
CHEST  1 VIEW

[chest pa]
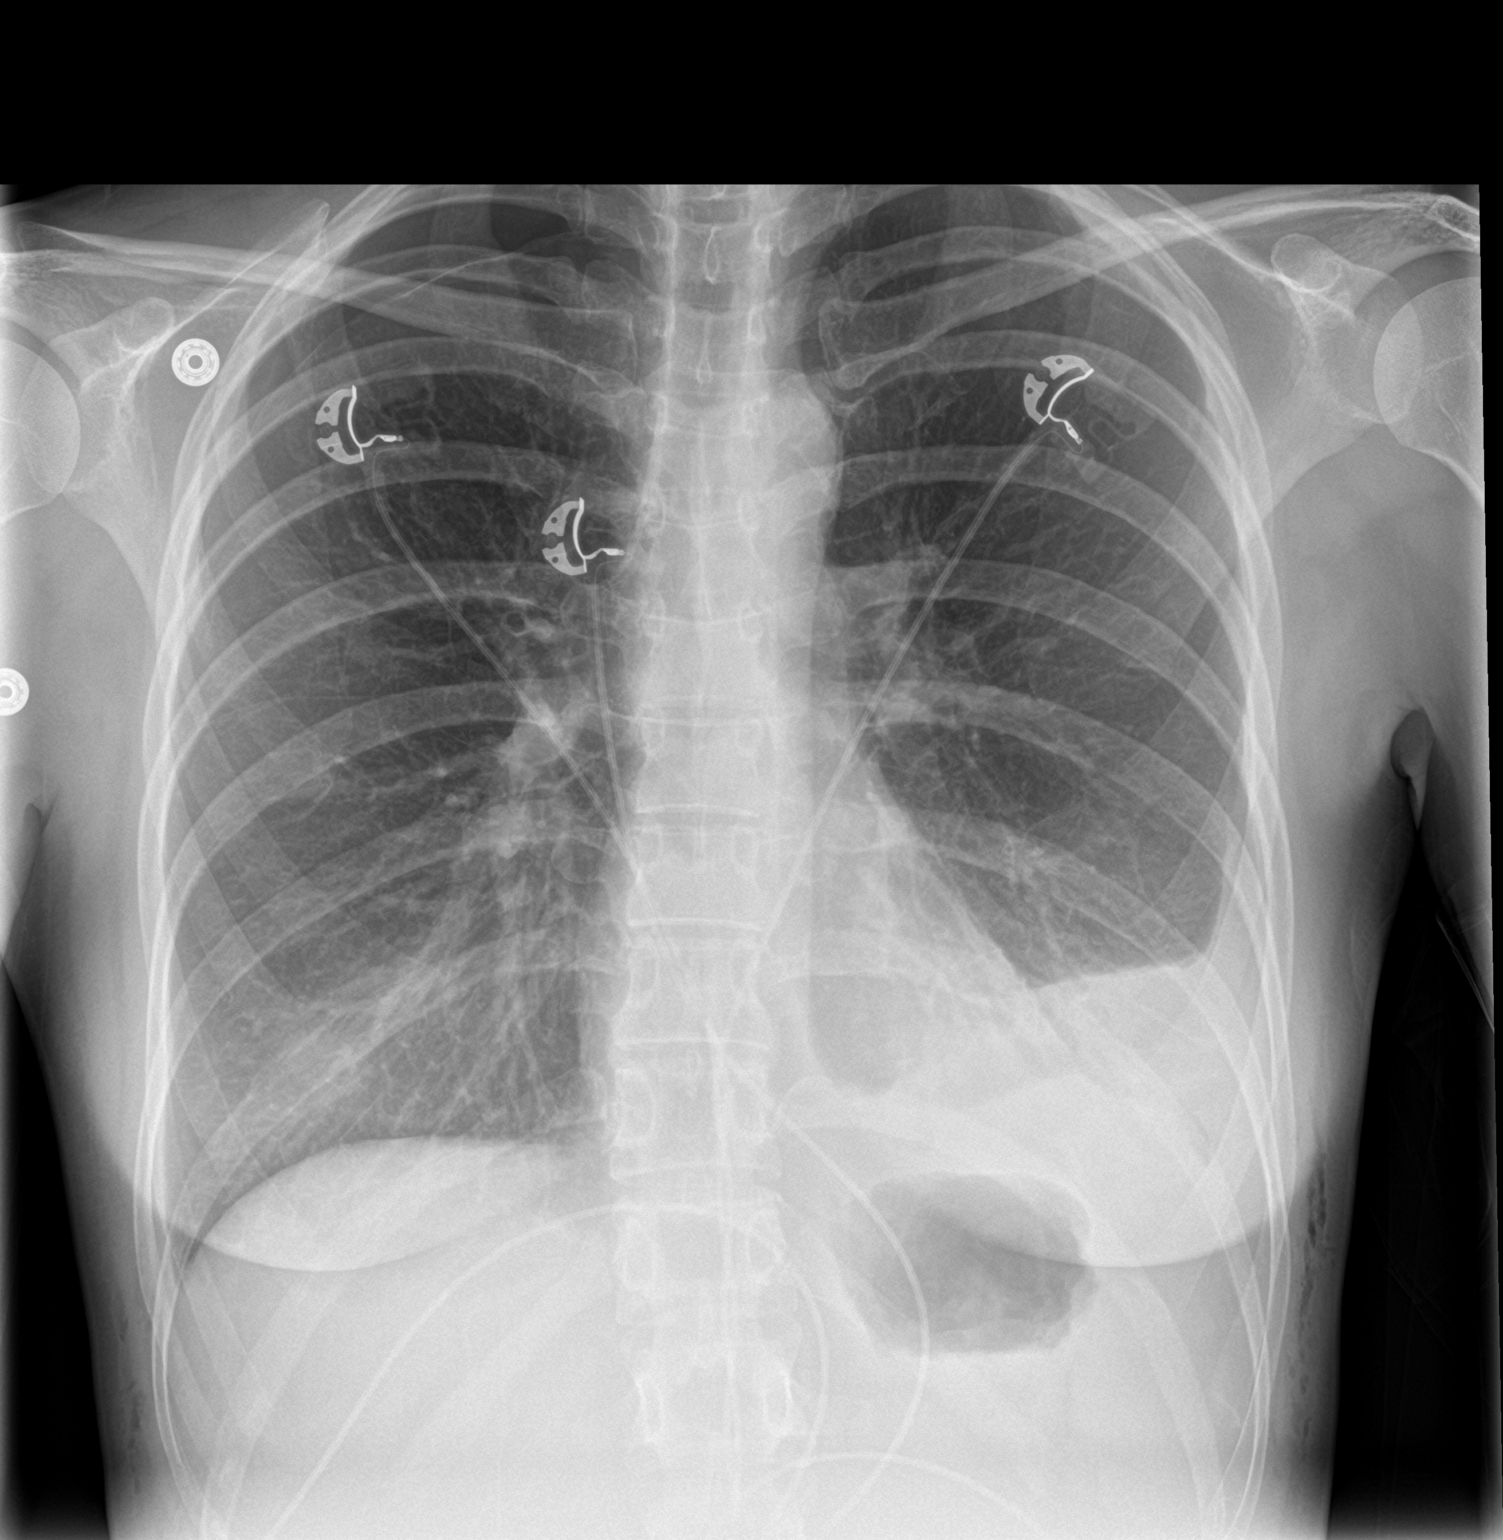

[1 of 1 positions shown; findings below may reference images not displayed]

FINDINGS: Standing PA view of the chest at [RI] hours. Substantially regressed
left side pleural effusion with no pneumothorax identified.
Mediastinal contours remain normal. Contralateral right side
pneumothorax is increased since yesterday, now small to moderate
(pleural edge at the right posterior 4th rib level now. Right lung
otherwise clear.

No osseous abnormality identified. Negative visible bowel gas
pattern.
IMPRESSION: 1. No left-side pneumothorax following thoracentesis but the right
side pneumothorax appears larger since the CTA yesterday, small to
moderate.
2. Substantially regressed left pleural effusion. No new
cardiopulmonary abnormality.

ADDENDUM:
Study discussed by telephone with Dr. FATY, [REDACTED]
on [DATE] at [RI] hours.

We discussed placing the patient on oxygen with repeat x-ray this
afternoon to re-evaluate the right side pneumothorax.

*** End of Addendum ***
FINDINGS: Standing PA view of the chest at [RI] hours. Substantially regressed
left side pleural effusion with no pneumothorax identified.
Mediastinal contours remain normal. Contralateral right side
pneumothorax is increased since yesterday, now small to moderate
(pleural edge at the right posterior 4th rib level now. Right lung
otherwise clear.

No osseous abnormality identified. Negative visible bowel gas
pattern.
IMPRESSION: 1. No left-side pneumothorax following thoracentesis but the right
side pneumothorax appears larger since the CTA yesterday, small to
moderate.
2. Substantially regressed left pleural effusion. No new
cardiopulmonary abnormality.

## 2020-08-21 IMAGING — DX DG CHEST 1V PORT
1 series · 1 of 1 positions shown · non-contrast
Comparison: Chest radiograph dated [DATE].

CLINICAL DATA: 35-year-old female with pneumothorax. Follow-up
exam.

EXAM:
PORTABLE CHEST 1 VIEW

[chest ap]
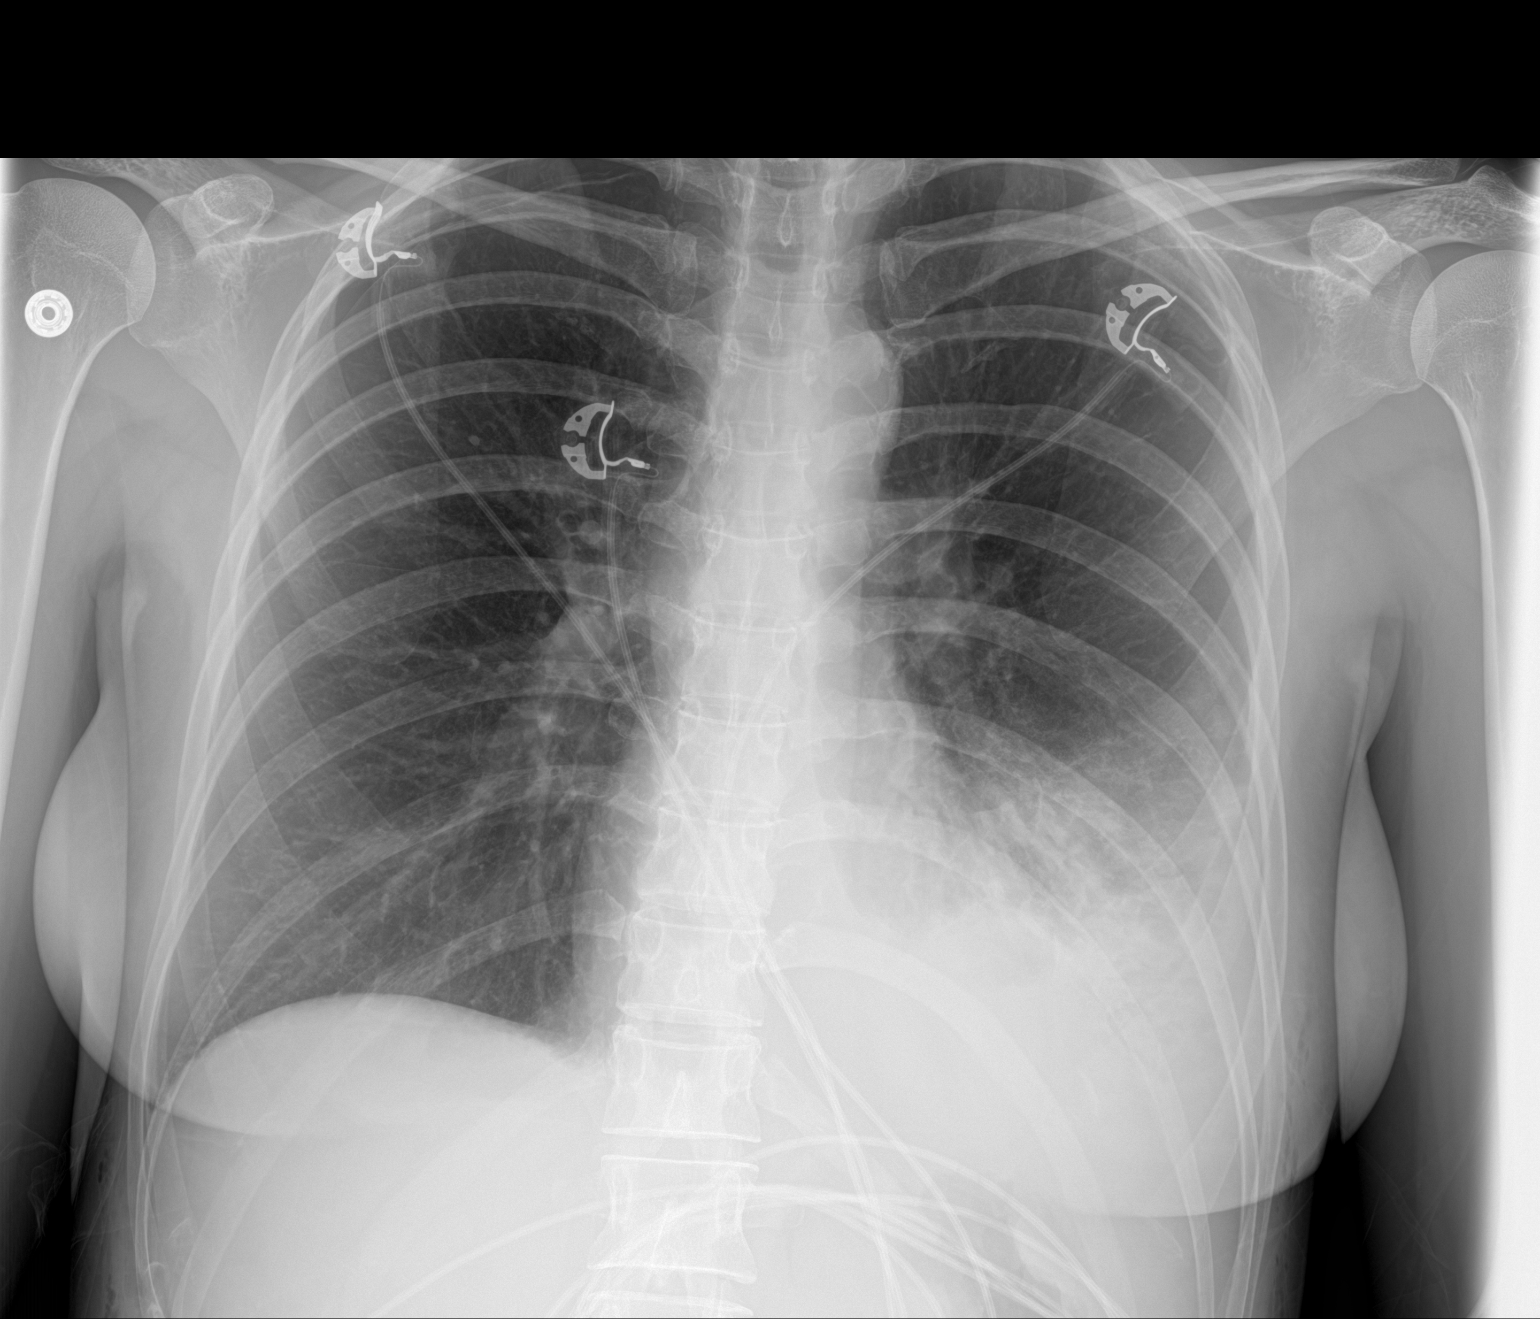

[1 of 1 positions shown; findings below may reference images not displayed]

FINDINGS: Right apical pneumothorax measuring up to 2.3 cm to the apical
pleural surface similar to prior radiograph. Small left pleural
effusion and left lung base atelectasis or infiltrate, slightly
increased since the prior radiograph. The cardiac silhouette is
within limits. No acute osseous pathology.
IMPRESSION: Right apical pneumothorax similar to prior radiograph.

## 2020-08-21 MED ORDER — HYPROMELLOSE (GONIOSCOPIC) 2.5 % OP SOLN
1.0000 [drp] | Freq: Three times a day (TID) | OPHTHALMIC | Status: DC | PRN
  Filled 2020-08-21: qty 15

## 2020-08-21 MED ORDER — POLYETHYLENE GLYCOL 3350 17 G PO PACK
17.0000 g | PACK | Freq: Every day | ORAL | Status: DC | PRN
  Administered 2020-08-22 (×2): 17 g via ORAL
  Filled 2020-08-21 (×2): qty 1

## 2020-08-21 MED ORDER — SENNA 8.6 MG PO TABS: 2.0000 | ORAL_TABLET | Freq: Every evening | ORAL | Status: DC | PRN

## 2020-08-21 MED ORDER — LIDOCAINE HCL (PF) 1 % IJ SOLN
INTRAMUSCULAR | Status: AC
  Filled 2020-08-21: qty 30

## 2020-08-21 MED ORDER — DOCUSATE SODIUM 100 MG PO CAPS
100.0000 mg | ORAL_CAPSULE | Freq: Two times a day (BID) | ORAL | Status: DC
  Administered 2020-08-22 – 2020-08-23 (×3): 100 mg via ORAL
  Filled 2020-08-21 (×4): qty 1

## 2020-08-21 MED ORDER — FERROUS SULFATE 325 (65 FE) MG PO TABS
325.0000 mg | ORAL_TABLET | Freq: Every day | ORAL | Status: DC
  Administered 2020-08-21 – 2020-08-23 (×3): 325 mg via ORAL
  Filled 2020-08-21 (×3): qty 1

## 2020-08-21 NOTE — Progress Notes (Signed)
PROGRESS NOTE    CAROLIE MCILRATH  KDX:[id number] DOB: [birth year] DOA: 08/20/2020 PCP: Donald Prose, MD   Brief Narrative:  Yvonne Garcia is a 35 year old female with a history of endometriosis, anxiety and iron deficiency who underwent laparoscopic surgery in [place] about 10 days ago with removal of significant endometriosis and included a bowel resection and appendectomy presented to ED was is: On 08/20/2020 with a complaint of chest pain.  She was diagnosed with large left-sided pleural effusion and mild to moderate right-sided pneumothorax.  Case was discussed with CT surgery by ED physician who had recommended thoracentesis.  Assessment & Plan:   Active Problems:   Iron deficiency anemia   Pneumothorax   Pleural effusion   SIRS (systemic inflammatory response syndrome) (HCC)  Large left-sided pleural effusion: Patient is status post therapeutic and diagnostic thoracentesis early morning today.  All the labs are pending.  Patient comfortable without any shortness of breath and not hypoxic either.  I have consulted PCCM for management.  Mild to moderate right-sided pneumothorax: It appears that ED physician had discussed the case with CT surgeon Dr. Roxan Hockey yesterday who recommended pleural effusion.  It is not clear whether they were formally consulted and are going to see or not.  I do not see a note.  It is not clear what the plan was about pneumothorax on the right.  Chest x-ray today post thoracentesis shows slightly worsened pneumothorax however patient remains comfortable without hypoxia.  I have consulted PCCM to evaluate patient for possible need of chest tube.  Iron deficiency anemia: Hemoglobin 9.9.  Monitor daily.  Will start on iron tablets.  SIRS: Criteria met due to leukocytosis and tachycardia however I think both of these are elevated due to stress.  No source of infection.  DVT prophylaxis: SCDs Start: 08/20/20 2207   Code Status: Full Code  Family Communication:  Patient sister present at bedside.  Plan of care discussed with patient and her sister in length and he verbalized understanding and agreed with it.  Status is: Inpatient  Remains inpatient appropriate because:Inpatient level of care appropriate due to severity of illness   Dispo: The patient is from: Home              Anticipated d/c is to: Home              Patient currently is not medically stable to d/c.   Difficult to place patient No        Estimated body mass index is 20.18 kg/m as calculated from the following:   Height as of this encounter: _0  (1.676 m).   Weight as of this encounter: 56.7 kg.      Nutritional status:               Consultants:   PCCM and possibly CT surgery  Procedures:   Thoracentesis 08/21/2020  Antimicrobials:  Anti-infectives (From admission, onward)   Start     Dose/Rate Route Frequency Ordered Stop   08/21/20 0800  vancomycin (VANCOREADY) IVPB 750 mg/150 mL        750 mg 150 mL/hr over 60 Minutes Intravenous Every 12 hours 08/20/20 1846     08/21/20 0600  ceFEPIme (MAXIPIME) 2 g in sodium chloride 0.9 % 100 mL IVPB        2 g 200 mL/hr over 30 Minutes Intravenous Every 8 hours 08/20/20 2215     08/20/20 1845  ceFEPIme (MAXIPIME) 2 g in sodium chloride 0.9 % 100 mL  IVPB        2 g 200 mL/hr over 30 Minutes Intravenous  Once 08/20/20 1835 08/20/20 2046   08/20/20 1845  vancomycin (VANCOCIN) IVPB 1000 mg/200 mL premix        1,000 mg 200 mL/hr over 60 Minutes Intravenous  Once 08/20/20 1842 08/20/20 2253         Subjective: Patient seen and examined.  Sister at the bedside.  She just returned from thoracentesis.  She still complains of shortness of breath.  Feels slightly better.  Also complains of chest pain on the left lower rib cage.  Worsened with deep breathing.  Objective: Vitals:   08/21/20 0200 08/21/20 0300 08/21/20 0900 08/21/20 0912  BP: 90/61 103/65 106/71 105/66  Pulse: (!) 101 (!) 104    Resp: (!)  22 16    Temp: 98.4 F (36.9 C) 98.2 F (36.8 C)    TempSrc: Oral Oral    SpO2: 93% 96%    Weight:      Height:        Intake/Output Summary (Last 24 hours) at 08/21/2020 1014 Last data filed at 08/20/2020 2205 Gross per 24 hour  Intake 1936.55 ml  Output --  Net 1936.55 ml   Filed Weights   08/20/20 1451  Weight: 56.7 kg    Examination:  General exam: Appears calm and comfortable  Respiratory system: Slightly diminished breath sounds at the left base. Cardiovascular system: S1 & S2 heard, RRR. No JVD, murmurs, rubs, gallops or clicks. No pedal edema. Gastrointestinal system: Abdomen is nondistended, soft and nontender. No organomegaly or masses felt. Normal bowel sounds heard. Central nervous system: Alert and oriented. No focal neurological deficits. Extremities: Symmetric 5 x 5 power. Skin: No rashes, lesions or ulcers Psychiatry: Judgement and insight appear normal. Mood & affect appropriate.    Data Reviewed: I have personally reviewed following labs and imaging studies  CBC: Recent Labs  Lab 08/20/20 1502 08/21/20 0200  WBC 13.1* 13.3*  NEUTROABS 9.0* 9.1*  HGB 10.8* 9.9*  HCT 33.0* 30.0*  MCV 86.4 87.0  PLT 440* 209*   Basic Metabolic Panel: Recent Labs  Lab 08/20/20 1502 08/21/20 0200  NA 136 135  K 4.0 3.8  CL 101 104  CO2 24 24  GLUCOSE 93 133*  BUN 8 <5*  CREATININE 0.49 0.54  CALCIUM 8.9 8.5*  MG  --  2.1  PHOS  --  2.7   GFR: Estimated Creatinine Clearance: 86.2 mL/min (by C-G formula based on SCr of 0.54 mg/dL). Liver Function Tests: Recent Labs  Lab 08/20/20 1502 08/21/20 0200  AST 59* 37  ALT 54* 46*  ALKPHOS 63 59  BILITOT 1.0 1.2  PROT 7.0 5.9*  ALBUMIN 3.8 2.8*   Recent Labs  Lab 08/20/20 1502  LIPASE <10*   No results for input(s): AMMONIA in the last 168 hours. Coagulation Profile: Recent Labs  Lab 08/20/20 1751  INR 1.1   Cardiac Enzymes: Recent Labs  Lab 08/20/20 2301  CKTOTAL 22*   BNP (last 3  results) No results for input(s): PROBNP in the last 8760 hours. HbA1C: No results for input(s): HGBA1C in the last 72 hours. CBG: No results for input(s): GLUCAP in the last 168 hours. Lipid Profile: No results for input(s): CHOL, HDL, LDLCALC, TRIG, CHOLHDL, LDLDIRECT in the last 72 hours. Thyroid Function Tests: Recent Labs    08/21/20 0200  TSH 2.513   Anemia Panel: Recent Labs    08/20/20 2301  VITAMINB12 691  FOLATE  17.9  FERRITIN 288  TIBC 231*  IRON 17*  RETICCTPCT 2.3   Sepsis Labs: Recent Labs  Lab 08/20/20 1751 08/20/20 2301  PROCALCITON  --  <0.10  LATICACIDVEN 0.7  --     Recent Results (from the past 240 hour(s))  Resp Panel by RT-PCR (Flu A&B, Covid) Nasopharyngeal Swab     Status: None   Collection Time: 08/20/20  8:12 PM   Specimen: Nasopharyngeal Swab; Nasopharyngeal(NP) swabs in vial transport medium  Result Value Ref Range Status   SARS Coronavirus 2 by RT PCR NEGATIVE NEGATIVE Final    Comment: (NOTE) SARS-CoV-2 target nucleic acids are NOT DETECTED.  The SARS-CoV-2 RNA is generally detectable in upper respiratory specimens during the acute phase of infection. The lowest concentration of SARS-CoV-2 viral copies this assay can detect is 138 copies/mL. A negative result does not preclude SARS-Cov-2 infection and should not be used as the sole basis for treatment or other patient management decisions. A negative result may occur with  improper specimen collection/handling, submission of specimen other than nasopharyngeal swab, presence of viral mutation(s) within the areas targeted by this assay, and inadequate number of viral copies(<138 copies/mL). A negative result must be combined with clinical observations, patient history, and epidemiological information. The expected result is Negative.  Fact Sheet for Patients:  EntrepreneurPulse.com.au  Fact Sheet for Healthcare Providers:   IncredibleEmployment.be  This test is no t yet approved or cleared by the [place] FDA and  has been authorized for detection and/or diagnosis of SARS-CoV-2 by FDA under an Emergency Use Authorization (EUA). This EUA will remain  in effect (meaning this test can be used) for the duration of the COVID-19 declaration under Section 564(b)(1) of the Act, 21 U.S.C.section 360bbb-3(b)(1), unless the authorization is terminated  or revoked sooner.       Influenza A by PCR NEGATIVE NEGATIVE Final   Influenza B by PCR NEGATIVE NEGATIVE Final    Comment: (NOTE) The Xpert Xpress SARS-CoV-2/FLU/RSV plus assay is intended as an aid in the diagnosis of influenza from Nasopharyngeal swab specimens and should not be used as a sole basis for treatment. Nasal washings and aspirates are unacceptable for Xpert Xpress SARS-CoV-2/FLU/RSV testing.  Fact Sheet for Patients: EntrepreneurPulse.com.au  Fact Sheet for Healthcare Providers: IncredibleEmployment.be  This test is not yet approved or cleared by the [place] FDA and has been authorized for detection and/or diagnosis of SARS-CoV-2 by FDA under an Emergency Use Authorization (EUA). This EUA will remain in effect (meaning this test can be used) for the duration of the COVID-19 declaration under Section 564(b)(1) of the Act, 21 U.S.C. section 360bbb-3(b)(1), unless the authorization is terminated or revoked.  Performed at [facility]   Surgical pcr screen     Status: None   Collection Time: 08/21/20  3:13 AM   Specimen: Nasal Mucosa; Nasal Swab  Result Value Ref Range Status   MRSA, PCR NEGATIVE NEGATIVE Final   Staphylococcus aureus NEGATIVE NEGATIVE Final    Comment: (NOTE) The Xpert SA Assay (FDA approved for NASAL specimens in patients 80 years of age and older), is one component of a comprehensive surveillance program. It is not intended to diagnose  infection nor to guide or monitor treatment. Performed at [facility], [place] [address] [place], [place] [address]       Radiology Studies: DG Chest 1 View  Result Date: 08/21/2020 CLINICAL DATA:  35 year old female status post ultrasound-guided left side thoracentesis this morning. EXAM: CHEST  1 VIEW COMPARISON:  CTA chest yesterday. FINDINGS: Standing PA view of the chest at 0908 hours. Substantially regressed left side pleural effusion with no pneumothorax identified. Mediastinal contours remain normal. Contralateral right side pneumothorax is increased since yesterday, now small to moderate (pleural edge at the right posterior 4th rib level now. Right lung otherwise clear. No osseous abnormality identified. Negative visible bowel gas pattern. IMPRESSION: 1. No left-side pneumothorax following thoracentesis but the right side pneumothorax appears larger since the CTA yesterday, small to moderate. 2. Substantially regressed left pleural effusion. No new cardiopulmonary abnormality. Electronically Signed   By: Genevie Ann M.D.   On: 08/21/2020 09:58   CT Angio Chest PE W and/or Wo Contrast  Result Date: 08/20/2020 CLINICAL DATA:  Recent appendectomy.  Chest pain EXAM: CT ANGIOGRAPHY CHEST CT ABDOMEN AND PELVIS WITH CONTRAST TECHNIQUE: Multidetector CT imaging of the chest was performed using the standard protocol during bolus administration of intravenous contrast. Multiplanar CT image reconstructions and MIPs were obtained to evaluate the vascular anatomy. Multidetector CT imaging of the abdomen and pelvis was performed using the standard protocol during bolus administration of intravenous contrast. CONTRAST:  163m OMNIPAQUE IOHEXOL 350 MG/ML SOLN COMPARISON:  CT abdomen pelvis 06/10/2020 FINDINGS: CTA CHEST FINDINGS Cardiovascular: No filling defects within the pulmonary arteries to suggest acute pulmonary embolism. No acute findings aorta great vessels. No pericardial fluid. Mediastinum/Nodes:  No axillary or supraclavicular adenopathy. No mediastinal or hilar adenopathy. No pericardial fluid. Esophagus normal. Lungs/Pleura: New large volume LEFT pleural effusion occupying approximately 60% of the LEFT hemithorax. The LEFT pleural fluid is intermediate density ranging size from HU from 20-25. There is complete passive atelectasis of the LEFT lower lobe. Atelectasis of the lingula additionally. Superior LEFT upper lobe is aerated. Additionally, there is a small to moderate volume RIGHT pneumothorax which collects anterior Lea within the RIGHT hemithorax in the supine position as well as over the RIGHT lung apex. Estimated volume approximately 15% of RIGHT hemithorax volume. Musculoskeletal: No rib fractures. Subcutaneous gas in the anterior chest wall along the costal margin. Review of the MIP images confirms the above findings. CT ABDOMEN and PELVIS FINDINGS Hepatobiliary: No focal hepatic lesion. No biliary duct dilatation. Common bile duct is normal. Pancreas: Pancreas is normal. No ductal dilatation. No pancreatic inflammation. Spleen: Low-density cysts within the spleen appears benign Adrenals/urinary tract: Adrenal glands and kidneys appear normal. Ureters and bladder normal. Stomach/Bowel: Stomach small normal. No complication LEFT lower quadrant related to recent appendectomy. The ascending, transverse and descending colon normal. Vascular/Lymphatic: Abdominal aorta is normal caliber. No periportal or retroperitoneal adenopathy. No pelvic adenopathy. Reproductive: A post hysterectomy. Small amount free fluid the pelvis. Abscess formation in the pelvis. No intraperitoneal free air. Other: extensive subcutaneous gas along the anterior abdominal and chest wall extending from the suprapubic region to the costal margin. Subcutaneous gas seen well on coronal image 57/series 10 Musculoskeletal: No aggressive osseous lesion.  No traumatic injury. Review of the MIP images confirms the above findings.  IMPRESSION: Chest Impression: 1. Large volume intermediate density LEFT pleural effusion occupying approximately 60% of the LEFT hemithorax volume. Complete passive atelectasis of the LEFT lower lobe and lingula. 2. Small to moderate volume RIGHT pneumothorax collects anteriorly within the RIGHT hemithorax. 3. Subcutaneous gas extends along the LEFT and RIGHT chest wall up to level of the breasts. 4. No evidence acute pulmonary embolism. Abdomen / Pelvis Impression: 1. Subcutaneous gas extends along the abdominal wall from the pubic symphysis to the costal margin on the LEFT  and RIGHT. 2. No complicating features within the peritoneal space related to the appendectomy and hysterectomy. Small amount of free fluid the pelvis as expected. No organized fluid collections. No abscess. No intraperitoneal free air. Critical Value/emergent results were called by telephone at the time of interpretation on 08/20/2020 at 5:29 pm to provider Pifer, MD, who verbally acknowledged these results. Electronically Signed   By: Suzy Bouchard M.D.   On: 08/20/2020 17:32   CT ABDOMEN PELVIS W CONTRAST  Result Date: 08/20/2020 CLINICAL DATA:  Recent appendectomy.  Chest pain EXAM: CT ANGIOGRAPHY CHEST CT ABDOMEN AND PELVIS WITH CONTRAST TECHNIQUE: Multidetector CT imaging of the chest was performed using the standard protocol during bolus administration of intravenous contrast. Multiplanar CT image reconstructions and MIPs were obtained to evaluate the vascular anatomy. Multidetector CT imaging of the abdomen and pelvis was performed using the standard protocol during bolus administration of intravenous contrast. CONTRAST:  167m OMNIPAQUE IOHEXOL 350 MG/ML SOLN COMPARISON:  CT abdomen pelvis 06/10/2020 FINDINGS: CTA CHEST FINDINGS Cardiovascular: No filling defects within the pulmonary arteries to suggest acute pulmonary embolism. No acute findings aorta great vessels. No pericardial fluid. Mediastinum/Nodes: No axillary or  supraclavicular adenopathy. No mediastinal or hilar adenopathy. No pericardial fluid. Esophagus normal. Lungs/Pleura: New large volume LEFT pleural effusion occupying approximately 60% of the LEFT hemithorax. The LEFT pleural fluid is intermediate density ranging size from HU from 20-25. There is complete passive atelectasis of the LEFT lower lobe. Atelectasis of the lingula additionally. Superior LEFT upper lobe is aerated. Additionally, there is a small to moderate volume RIGHT pneumothorax which collects anterior Lea within the RIGHT hemithorax in the supine position as well as over the RIGHT lung apex. Estimated volume approximately 15% of RIGHT hemithorax volume. Musculoskeletal: No rib fractures. Subcutaneous gas in the anterior chest wall along the costal margin. Review of the MIP images confirms the above findings. CT ABDOMEN and PELVIS FINDINGS Hepatobiliary: No focal hepatic lesion. No biliary duct dilatation. Common bile duct is normal. Pancreas: Pancreas is normal. No ductal dilatation. No pancreatic inflammation. Spleen: Low-density cysts within the spleen appears benign Adrenals/urinary tract: Adrenal glands and kidneys appear normal. Ureters and bladder normal. Stomach/Bowel: Stomach small normal. No complication LEFT lower quadrant related to recent appendectomy. The ascending, transverse and descending colon normal. Vascular/Lymphatic: Abdominal aorta is normal caliber. No periportal or retroperitoneal adenopathy. No pelvic adenopathy. Reproductive: A post hysterectomy. Small amount free fluid the pelvis. Abscess formation in the pelvis. No intraperitoneal free air. Other: extensive subcutaneous gas along the anterior abdominal and chest wall extending from the suprapubic region to the costal margin. Subcutaneous gas seen well on coronal image 57/series 10 Musculoskeletal: No aggressive osseous lesion.  No traumatic injury. Review of the MIP images confirms the above findings. IMPRESSION: Chest  Impression: 1. Large volume intermediate density LEFT pleural effusion occupying approximately 60% of the LEFT hemithorax volume. Complete passive atelectasis of the LEFT lower lobe and lingula. 2. Small to moderate volume RIGHT pneumothorax collects anteriorly within the RIGHT hemithorax. 3. Subcutaneous gas extends along the LEFT and RIGHT chest wall up to level of the breasts. 4. No evidence acute pulmonary embolism. Abdomen / Pelvis Impression: 1. Subcutaneous gas extends along the abdominal wall from the pubic symphysis to the costal margin on the LEFT and RIGHT. 2. No complicating features within the peritoneal space related to the appendectomy and hysterectomy. Small amount of free fluid the pelvis as expected. No organized fluid collections. No abscess. No intraperitoneal free air. Critical Value/emergent results were  called by telephone at the time of interpretation on 08/20/2020 at 5:29 pm to provider Pifer, MD, who verbally acknowledged these results. Electronically Signed   By: Suzy Bouchard M.D.   On: 08/20/2020 17:32    Scheduled Meds: . lidocaine (PF)       Continuous Infusions: . ceFEPime (MAXIPIME) IV 2 g (08/21/20 0504)  . lactated ringers 125 mL/hr at 08/20/20 2211  . vancomycin       LOS: 1 day   Time spent: 38 minutes   Darliss Cheney, MD [facility]  08/21/2020, 10:14 AM   To contact the attending provider between 7A-7P or the covering provider during after hours 7P-7A, please log into the web site [email or web address].

## 2020-08-21 NOTE — Progress Notes (Signed)
Pt went down for a thorocentesis, VSS @0815  Returned at 1000, VSS, will continue to monitor.   , RN 08/21/2020 2:43 PM

## 2020-08-21 NOTE — Progress Notes (Signed)
Pharmacy Antibiotic Note  Yvonne Garcia is a 35 y.o. female admitted on 08/20/2020 with pneumonia.  Pharmacy has been consulted for cefepime dosing.  WBC stable 13, afebrile, a little tachy, BP 110/80, MRSA PCR negative.  Plan: Stop vancomycin per MD Cefepime 2gm IV q8 Monitor renal function, Cx/PCR to narrow Vancomycin levels as indicated  Height: 5\' 6"  (167.6 cm) Weight: 56.7 kg (125 lb) IBW/kg (Calculated) : 59.3  Temp (24hrs), Avg:98.7 F (37.1 C), Min:98.2 F (36.8 C), Max:99.9 F (37.7 C)  Recent Labs  Lab 08/20/20 1502 08/20/20 1751 08/21/20 0200  WBC 13.1*  --  13.3*  CREATININE 0.49  --  0.54  LATICACIDVEN  --  0.7  --     Estimated Creatinine Clearance: 86.2 mL/min (by C-G formula based on SCr of 0.54 mg/dL).    No Known Allergies   4/2 vanc - 4/3 4/2 cefepime>>   4/2 MRSA swab neg  6/3, PharmD PGY1 Pharmacy Resident 08/21/2020 10:22 AM

## 2020-08-21 NOTE — Consult Note (Signed)
Reason for Consult: Right pneumothorax and left pleural effusion Referring Physician: ED  Yvonne Garcia is an 35 y.o. female.  HPI: Yvonne Garcia is a 35 year old woman with a history of endometriosis who presented with chief complaint of pleuritic chest pain and shortness of breath.  She had a hysterectomy, appendectomy, and small bowel resection in [place] for endometriosis on 08/09/2020.  We do not have records of that visit.  She reports that she had extensive subcutaneous emphysema and shortness of breath while in the hospital.  She was monitored for anemia but did not require transfusion.  She was discharged after 6 days and returned home to [place].  On Friday she noticed that she was having some chest discomfort.  Yesterday the pain increased and she presented to the ED at [facility]  A CT of the chest showed a large left pleural effusion and a small right pneumothorax.  She was transferred to [facility] for further management.  She had mild tachycardia and tachypnea but was afebrile.  White count was slightly elevated at 13.3 and her hematocrit was 30.  This morning she had a thoracentesis.  1.4 L of bloody fluid was drained from the left chest.  She developed coughing and the procedure had to be terminated.  Follow-up chest x-ray showed a decrease in the size of the pleural effusion.  There was possible increase in the size of the right pneumothorax although it still was relatively small.  Past Medical History:  Diagnosis Date  . Allergies   . Anxiety   . Family history of breast cancer 05/05/2020  . Family history of colon cancer 05/05/2020  . Family history of kidney cancer 05/05/2020  . Family history of pancreatic cancer 05/05/2020  . Internal hemorrhoids     Past Surgical History:  Procedure Laterality Date  . ABDOMINAL HYSTERECTOMY    . APPENDECTOMY    . LAPAROSCOPIC BOWEL RESECTION    . TONSILLECTOMY    . WISDOM TOOTH EXTRACTION      Family History  Problem Relation  Age of Onset  . Mental illness Mother        suicide.   . Pancreatic cancer Mother 80  . Breast cancer Mother 80  . Kidney cancer Father 21  . Colon cancer Maternal Grandfather        dx 81s  . Kidney cancer Paternal Grandfather        dx unknown age    Social History:  reports that she has never smoked. She has never used smokeless tobacco. She reports current alcohol use. She reports that she does not use drugs.  Allergies: No Known Allergies  Medications:  Scheduled: . ferrous sulfate  325 mg Oral Q breakfast    Results for orders placed or performed during the hospital encounter of 08/20/20 (from the past 48 hour(s))  CBC with Differential     Status: Abnormal   Collection Time: 08/20/20  3:02 PM  Result Value Ref Range   WBC 13.1 (H) 4.0 - 10.5 K/uL   RBC 3.82 (L) 3.87 - 5.11 MIL/uL   Hemoglobin 10.8 (L) 12.0 - 15.0 g/dL   HCT 41.9 (L) 62.2 - 29.7 %   MCV 86.4 80.0 - 100.0 fL   MCH 28.3 26.0 - 34.0 pg   MCHC 32.7 30.0 - 36.0 g/dL   RDW 98.9 21.1 - 94.1 %   Platelets 440 (H) 150 - 400 K/uL   nRBC 0.0 0.0 - 0.2 %   Neutrophils Relative % 68 %  Neutro Abs 9.0 (H) 1.7 - 7.7 K/uL   Lymphocytes Relative 15 %   Lymphs Abs 2.0 0.7 - 4.0 K/uL   Monocytes Relative 8 %   Monocytes Absolute 1.1 (H) 0.1 - 1.0 K/uL   Eosinophils Relative 6 %   Eosinophils Absolute 0.8 (H) 0.0 - 0.5 K/uL   Basophils Relative 1 %   Basophils Absolute 0.1 0.0 - 0.1 K/uL   Immature Granulocytes 2 %   Abs Immature Granulocytes 0.20 (H) 0.00 - 0.07 K/uL    Comment: Performed at [facility]  Comprehensive metabolic panel     Status: Abnormal   Collection Time: 08/20/20  3:02 PM  Result Value Ref Range   Sodium 136 135 - 145 mmol/L   Potassium 4.0 3.5 - 5.1 mmol/L   Chloride 101 98 - 111 mmol/L   CO2 24 22 - 32 mmol/L   Glucose, Bld 93 70 - 99 mg/dL    Comment: Glucose reference range applies only to samples taken after fasting for at least 8 hours.   BUN 8 6 - 20 mg/dL    Creatinine, Ser 1.61 0.44 - 1.00 mg/dL   Calcium 8.9 8.9 - 09.6 mg/dL   Total Protein 7.0 6.5 - 8.1 g/dL   Albumin 3.8 3.5 - 5.0 g/dL   AST 59 (H) 15 - 41 U/L   ALT 54 (H) 0 - 44 U/L   Alkaline Phosphatase 63 38 - 126 U/L   Total Bilirubin 1.0 0.3 - 1.2 mg/dL   GFR, Estimated >04 >54 mL/min    Comment: (NOTE) Calculated using the CKD-EPI Creatinine Equation (2021)    Anion gap 11 5 - 15    Comment: Performed at [facility]  Lipase, blood     Status: Abnormal   Collection Time: 08/20/20  3:02 PM  Result Value Ref Range   Lipase <10 (L) 11 - 51 U/L    Comment: Performed at [facility]  Urinalysis, Routine w reflex microscopic Urine, Clean Catch     Status: Abnormal   Collection Time: 08/20/20  3:02 PM  Result Value Ref Range   Color, Urine COLORLESS (A) YELLOW   APPearance CLEAR CLEAR   Specific Gravity, Urine 1.003 (L) 1.005 - 1.030   pH 5.5 5.0 - 8.0   Glucose, UA NEGATIVE NEGATIVE mg/dL   Hgb urine dipstick SMALL (A) NEGATIVE   Bilirubin Urine NEGATIVE NEGATIVE   Ketones, ur 15 (A) NEGATIVE mg/dL   Protein, ur NEGATIVE NEGATIVE mg/dL   Nitrite NEGATIVE NEGATIVE   Leukocytes,Ua NEGATIVE NEGATIVE   RBC / HPF 0-5 0 - 5 RBC/hpf   WBC, UA 0-5 0 - 5 WBC/hpf   Bacteria, UA RARE (A) NONE SEEN   Squamous Epithelial / LPF 0-5 0 - 5   Crystals PRESENT (A) NEGATIVE    Comment: Performed at [facility]  Troponin I (High Sensitivity)     Status: None   Collection Time: 08/20/20  3:02 PM  Result Value Ref Range   Troponin I (High Sensitivity) <2 <18 ng/L    Comment: (NOTE) Elevated high sensitivity troponin I (hsTnI) values and significant  changes across serial measurements may suggest ACS but many other  chronic and acute conditions are known to elevate hsTnI results.  Refer to the "Links" section for chest pain algorithms and additional  guidance. Performed at [facility]   Troponin I (High Sensitivity)      Status: None   Collection Time:  08/20/20  4:21 PM  Result Value Ref Range   Troponin I (High Sensitivity) <2 <18 ng/L    Comment: (NOTE) Elevated high sensitivity troponin I (hsTnI) values and significant  changes across serial measurements may suggest ACS but many other  chronic and acute conditions are known to elevate hsTnI results.  Refer to the "Links" section for chest pain algorithms and additional  guidance. Performed at [facility]   Protime-INR     Status: None   Collection Time: 08/20/20  5:51 PM  Result Value Ref Range   Prothrombin Time 14.1 11.4 - 15.2 seconds   INR 1.1 0.8 - 1.2    Comment: (NOTE) INR goal varies based on device and disease states. Performed at [facility]   Lactic acid, plasma     Status: None   Collection Time: 08/20/20  5:51 PM  Result Value Ref Range   Lactic Acid, Venous 0.7 0.5 - 1.9 mmol/L    Comment: Performed at [facility]  Resp Panel by RT-PCR (Flu A&B, Covid) Nasopharyngeal Swab     Status: None   Collection Time: 08/20/20  8:12 PM   Specimen: Nasopharyngeal Swab; Nasopharyngeal(NP) swabs in vial transport medium  Result Value Ref Range   SARS Coronavirus 2 by RT PCR NEGATIVE NEGATIVE    Comment: (NOTE) SARS-CoV-2 target nucleic acids are NOT DETECTED.  The SARS-CoV-2 RNA is generally detectable in upper respiratory specimens during the acute phase of infection. The lowest concentration of SARS-CoV-2 viral copies this assay can detect is 138 copies/mL. A negative result does not preclude SARS-Cov-2 infection and should not be used as the sole basis for treatment or other patient management decisions. A negative result may occur with  improper specimen collection/handling, submission of specimen other than nasopharyngeal swab, presence of viral mutation(s) within the areas targeted by this assay, and inadequate number of viral copies(<138 copies/mL). A negative result must be  combined with clinical observations, patient history, and epidemiological information. The expected result is Negative.  Fact Sheet for Patients:  BloggerCourse.com  Fact Sheet for Healthcare Providers:  SeriousBroker.it  This test is no t yet approved or cleared by the [place] FDA and  has been authorized for detection and/or diagnosis of SARS-CoV-2 by FDA under an Emergency Use Authorization (EUA). This EUA will remain  in effect (meaning this test can be used) for the duration of the COVID-19 declaration under Section 564(b)(1) of the Act, 21 U.S.C.section 360bbb-3(b)(1), unless the authorization is terminated  or revoked sooner.       Influenza A by PCR NEGATIVE NEGATIVE   Influenza B by PCR NEGATIVE NEGATIVE    Comment: (NOTE) The Xpert Xpress SARS-CoV-2/FLU/RSV plus assay is intended as an aid in the diagnosis of influenza from Nasopharyngeal swab specimens and should not be used as a sole basis for treatment. Nasal washings and aspirates are unacceptable for Xpert Xpress SARS-CoV-2/FLU/RSV testing.  Fact Sheet for Patients: BloggerCourse.com  Fact Sheet for Healthcare Providers: SeriousBroker.it  This test is not yet approved or cleared by the [place] FDA and has been authorized for detection and/or diagnosis of SARS-CoV-2 by FDA under an Emergency Use Authorization (EUA). This EUA will remain in effect (meaning this test can be used) for the duration of the COVID-19 declaration under Section 564(b)(1) of the Act, 21 U.S.C. section 360bbb-3(b)(1), unless the authorization is terminated or revoked.  Performed at [facility]   CK     Status: Abnormal  Collection Time: 08/20/20 11:01 PM  Result Value Ref Range   Total CK 22 (L) 38 - 234 U/L    Comment: Performed at [facility], [address], [place], [place] [address]  Vitamin  B12     Status: None   Collection Time: 08/20/20 11:01 PM  Result Value Ref Range   Vitamin B-12 691 180 - 914 pg/mL    Comment: (NOTE) This assay is not validated for testing neonatal or myeloproliferative syndrome specimens for Vitamin B12 levels. Performed at [facility], [address] [place], [place] [address]   Folate     Status: None   Collection Time: 08/20/20 11:01 PM  Result Value Ref Range   Folate 17.9 >5.9 ng/mL    Comment: Performed at [facility], [address] [place], [place] [address]  Iron and TIBC     Status: Abnormal   Collection Time: 08/20/20 11:01 PM  Result Value Ref Range   Iron 17 (L) 28 - 170 ug/dL   TIBC 740 (L) 814 - 481 ug/dL   Saturation Ratios 7 (L) 10.4 - 31.8 %   UIBC 214 ug/dL    Comment: Performed at [facility], [address], [place], [place] [address]  Ferritin     Status: None   Collection Time: 08/20/20 11:01 PM  Result Value Ref Range   Ferritin 288 11 - 307 ng/mL    Comment: Performed at [facility], [address], [place], [place] [address]  Reticulocytes     Status: Abnormal   Collection Time: 08/20/20 11:01 PM  Result Value Ref Range   Retic Ct Pct 2.3 0.4 - 3.1 %   RBC. 3.42 (L) 3.87 - 5.11 MIL/uL   Retic Count, Absolute 78.7 19.0 - 186.0 K/uL   Immature Retic Fract 21.3 (H) 2.3 - 15.9 %    Comment: Performed at [facility], [address] [place], [place] [address]  Procalcitonin     Status: None   Collection Time: 08/20/20 11:01 PM  Result Value Ref Range   Procalcitonin <0.10 ng/mL    Comment:        Interpretation: PCT (Procalcitonin) <= 0.5 ng/mL: Systemic infection (sepsis) is not likely. Local bacterial infection is possible. (NOTE)       Sepsis PCT Algorithm           Lower Respiratory Tract                                      Infection PCT Algorithm    ----------------------------     ----------------------------         PCT < 0.25 ng/mL                PCT < 0.10  ng/mL          Strongly encourage             Strongly discourage   discontinuation of antibiotics    initiation of antibiotics    ----------------------------     -----------------------------       PCT 0.25 - 0.50 ng/mL            PCT 0.10 - 0.25 ng/mL               OR       >80% decrease in PCT  Discourage initiation of                                            antibiotics      Encourage discontinuation           of antibiotics    ----------------------------     -----------------------------         PCT >= 0.50 ng/mL              PCT 0.26 - 0.50 ng/mL               AND        <80% decrease in PCT             Encourage initiation of                                             antibiotics       Encourage continuation           of antibiotics    ----------------------------     -----------------------------        PCT >= 0.50 ng/mL                  PCT > 0.50 ng/mL               AND         increase in PCT                  Strongly encourage                                      initiation of antibiotics    Strongly encourage escalation           of antibiotics                                     -----------------------------                                           PCT <= 0.25 ng/mL                                                 OR                                        > 80% decrease in PCT                                      Discontinue / Do not initiate  antibiotics  Performed at [facility], [address], [place], [place] [address]   Lactate dehydrogenase     Status: Abnormal   Collection Time: 08/20/20 11:01 PM  Result Value Ref Range   LDH 266 (H) 98 - 192 U/L    Comment: Performed at [facility], [address], [place], [place] [address]  HIV Antibody (routine testing w rflx)     Status: None   Collection Time: 08/21/20  2:00 AM  Result Value Ref Range   HIV Screen 4th Generation wRfx Non  Reactive Non Reactive    Comment: Performed at [facility], [address], [place], [place] [address]  Magnesium     Status: None   Collection Time: 08/21/20  2:00 AM  Result Value Ref Range   Magnesium 2.1 1.7 - 2.4 mg/dL    Comment: Performed at [facility], [address], [place], [place] [address]  Phosphorus     Status: None   Collection Time: 08/21/20  2:00 AM  Result Value Ref Range   Phosphorus 2.7 2.5 - 4.6 mg/dL    Comment: Performed at [facility], [address], [place], [place] [address]  CBC WITH DIFFERENTIAL     Status: Abnormal   Collection Time: 08/21/20  2:00 AM  Result Value Ref Range   WBC 13.3 (H) 4.0 - 10.5 K/uL   RBC 3.45 (L) 3.87 - 5.11 MIL/uL   Hemoglobin 9.9 (L) 12.0 - 15.0 g/dL   HCT 13.0 (L) 86.5 - 78.4 %   MCV 87.0 80.0 - 100.0 fL   MCH 28.7 26.0 - 34.0 pg   MCHC 33.0 30.0 - 36.0 g/dL   RDW 69.6 29.5 - 28.4 %   Platelets 547 (H) 150 - 400 K/uL   nRBC 0.0 0.0 - 0.2 %   Neutrophils Relative % 68 %   Neutro Abs 9.1 (H) 1.7 - 7.7 K/uL   Lymphocytes Relative 17 %   Lymphs Abs 2.2 0.7 - 4.0 K/uL   Monocytes Relative 9 %   Monocytes Absolute 1.2 (H) 0.1 - 1.0 K/uL   Eosinophils Relative 4 %   Eosinophils Absolute 0.5 0.0 - 0.5 K/uL   Basophils Relative 0 %   Basophils Absolute 0.0 0.0 - 0.1 K/uL   Immature Granulocytes 2 %   Abs Immature Granulocytes 0.23 (H) 0.00 - 0.07 K/uL    Comment: Performed at [facility], [address] [place], [place] [address]  TSH     Status: None   Collection Time: 08/21/20  2:00 AM  Result Value Ref Range   TSH 2.513 0.350 - 4.500 uIU/mL    Comment: Performed by a 3rd Generation assay with a functional sensitivity of <=0.01 uIU/mL. Performed at [facility], [address], [place], [place] [address]   Comprehensive metabolic panel     Status: Abnormal   Collection Time: 08/21/20  2:00 AM  Result Value Ref Range   Sodium 135 135 - 145 mmol/L   Potassium 3.8 3.5 - 5.1 mmol/L    Chloride 104 98 - 111 mmol/L   CO2 24 22 - 32 mmol/L   Glucose, Bld 133 (H) 70 - 99 mg/dL    Comment: Glucose reference range applies only to samples taken after fasting for at least 8 hours.   BUN <5 (L) 6 - 20 mg/dL   Creatinine, Ser 2.53 0.44 - 1.00 mg/dL   Calcium 8.5 (L) 8.9 -  10.3 mg/dL   Total Protein 5.9 (L) 6.5 - 8.1 g/dL   Albumin 2.8 (L) 3.5 - 5.0 g/dL   AST 37 15 - 41 U/L   ALT 46 (H) 0 - 44 U/L   Alkaline Phosphatase 59 38 - 126 U/L   Total Bilirubin 1.2 0.3 - 1.2 mg/dL   GFR, Estimated >31 >51 mL/min    Comment: (NOTE) Calculated using the CKD-EPI Creatinine Equation (2021)    Anion gap 7 5 - 15    Comment: Performed at [facility], [address], [place], [place] [address]  Surgical pcr screen     Status: None   Collection Time: 08/21/20  3:13 AM   Specimen: Nasal Mucosa; Nasal Swab  Result Value Ref Range   MRSA, PCR NEGATIVE NEGATIVE   Staphylococcus aureus NEGATIVE NEGATIVE    Comment: (NOTE) The Xpert SA Assay (FDA approved for NASAL specimens in patients 66 years of age and older), is one component of a comprehensive surveillance program. It is not intended to diagnose infection nor to guide or monitor treatment. Performed at [facility], [address], [place], [place] [address]     DG Chest 1 View  Addendum Date: 08/21/2020   ADDENDUM REPORT: 08/21/2020 10:32 ADDENDUM: Study discussed by telephone with Dr. Jacqulyn Bath, hospitalist service on 08/21/2020 at 1028 hours. We discussed placing the patient on oxygen with repeat x-ray this afternoon to re-evaluate the right side pneumothorax. Electronically Signed   By: Odessa Fleming M.D.   On: 08/21/2020 10:32   Result Date: 08/21/2020 CLINICAL DATA:  35 year old female status post ultrasound-guided left side thoracentesis this morning. EXAM: CHEST  1 VIEW COMPARISON:  CTA chest yesterday. FINDINGS: Standing PA view of the chest at 0908 hours. Substantially regressed left side pleural effusion with no  pneumothorax identified. Mediastinal contours remain normal. Contralateral right side pneumothorax is increased since yesterday, now small to moderate (pleural edge at the right posterior 4th rib level now. Right lung otherwise clear. No osseous abnormality identified. Negative visible bowel gas pattern. IMPRESSION: 1. No left-side pneumothorax following thoracentesis but the right side pneumothorax appears larger since the CTA yesterday, small to moderate. 2. Substantially regressed left pleural effusion. No new cardiopulmonary abnormality. Electronically Signed: By: Odessa Fleming M.D. On: 08/21/2020 09:58   CT Angio Chest PE W and/or Wo Contrast  Result Date: 08/20/2020 CLINICAL DATA:  Recent appendectomy.  Chest pain EXAM: CT ANGIOGRAPHY CHEST CT ABDOMEN AND PELVIS WITH CONTRAST TECHNIQUE: Multidetector CT imaging of the chest was performed using the standard protocol during bolus administration of intravenous contrast. Multiplanar CT image reconstructions and MIPs were obtained to evaluate the vascular anatomy. Multidetector CT imaging of the abdomen and pelvis was performed using the standard protocol during bolus administration of intravenous contrast. CONTRAST:  OMNIPAQUE IOHEXOL 350 MG/ML SOLN COMPARISON:  CT abdomen pelvis 06/10/2020 FINDINGS: CTA CHEST FINDINGS Cardiovascular: No filling defects within the pulmonary arteries to suggest acute pulmonary embolism. No acute findings aorta great vessels. No pericardial fluid. Mediastinum/Nodes: No axillary or supraclavicular adenopathy. No mediastinal or hilar adenopathy. No pericardial fluid. Esophagus normal. Lungs/Pleura: New large volume LEFT pleural effusion occupying approximately 60% of the LEFT hemithorax. The LEFT pleural fluid is intermediate density ranging size from HU from 20-25. There is complete passive atelectasis of the LEFT lower lobe. Atelectasis of the lingula additionally. Superior LEFT upper lobe is aerated. Additionally, there is a  small to moderate volume RIGHT pneumothorax which collects anterior Lea within the RIGHT hemithorax  in the supine position as well as over the RIGHT lung apex. Estimated volume approximately 15% of RIGHT hemithorax volume. Musculoskeletal: No rib fractures. Subcutaneous gas in the anterior chest wall along the costal margin. Review of the MIP images confirms the above findings. CT ABDOMEN and PELVIS FINDINGS Hepatobiliary: No focal hepatic lesion. No biliary duct dilatation. Common bile duct is normal. Pancreas: Pancreas is normal. No ductal dilatation. No pancreatic inflammation. Spleen: Low-density cysts within the spleen appears benign Adrenals/urinary tract: Adrenal glands and kidneys appear normal. Ureters and bladder normal. Stomach/Bowel: Stomach small normal. No complication LEFT lower quadrant related to recent appendectomy. The ascending, transverse and descending colon normal. Vascular/Lymphatic: Abdominal aorta is normal caliber. No periportal or retroperitoneal adenopathy. No pelvic adenopathy. Reproductive: A post hysterectomy. Small amount free fluid the pelvis. Abscess formation in the pelvis. No intraperitoneal free air. Other: extensive subcutaneous gas along the anterior abdominal and chest wall extending from the suprapubic region to the costal margin. Subcutaneous gas seen well on coronal image 57/series 10 Musculoskeletal: No aggressive osseous lesion.  No traumatic injury. Review of the MIP images confirms the above findings. IMPRESSION: Chest Impression: 1. Large volume intermediate density LEFT pleural effusion occupying approximately 60% of the LEFT hemithorax volume. Complete passive atelectasis of the LEFT lower lobe and lingula. 2. Small to moderate volume RIGHT pneumothorax collects anteriorly within the RIGHT hemithorax. 3. Subcutaneous gas extends along the LEFT and RIGHT chest wall up to level of the breasts. 4. No evidence acute pulmonary embolism. Abdomen / Pelvis Impression: 1.  Subcutaneous gas extends along the abdominal wall from the pubic symphysis to the costal margin on the LEFT and RIGHT. 2. No complicating features within the peritoneal space related to the appendectomy and hysterectomy. Small amount of free fluid the pelvis as expected. No organized fluid collections. No abscess. No intraperitoneal free air. Critical Value/emergent results were called by telephone at the time of interpretation on 08/20/2020 at 5:29 pm to provider Pifer, MD, who verbally acknowledged these results. Electronically Signed   By: Genevive Bi M.D.   On: 08/20/2020 17:32   CT ABDOMEN PELVIS W CONTRAST  Result Date: 08/20/2020 CLINICAL DATA:  Recent appendectomy.  Chest pain EXAM: CT ANGIOGRAPHY CHEST CT ABDOMEN AND PELVIS WITH CONTRAST TECHNIQUE: Multidetector CT imaging of the chest was performed using the standard protocol during bolus administration of intravenous contrast. Multiplanar CT image reconstructions and MIPs were obtained to evaluate the vascular anatomy. Multidetector CT imaging of the abdomen and pelvis was performed using the standard protocol during bolus administration of intravenous contrast. CONTRAST:  OMNIPAQUE IOHEXOL 350 MG/ML SOLN COMPARISON:  CT abdomen pelvis 06/10/2020 FINDINGS: CTA CHEST FINDINGS Cardiovascular: No filling defects within the pulmonary arteries to suggest acute pulmonary embolism. No acute findings aorta great vessels. No pericardial fluid. Mediastinum/Nodes: No axillary or supraclavicular adenopathy. No mediastinal or hilar adenopathy. No pericardial fluid. Esophagus normal. Lungs/Pleura: New large volume LEFT pleural effusion occupying approximately 60% of the LEFT hemithorax. The LEFT pleural fluid is intermediate density ranging size from HU from 20-25. There is complete passive atelectasis of the LEFT lower lobe. Atelectasis of the lingula additionally. Superior LEFT upper lobe is aerated. Additionally, there is a small to moderate volume RIGHT  pneumothorax which collects anterior Lea within the RIGHT hemithorax in the supine position as well as over the RIGHT lung apex. Estimated volume approximately 15% of RIGHT hemithorax volume. Musculoskeletal: No rib fractures. Subcutaneous gas in the anterior chest wall along the costal margin. Review of the MIP  images confirms the above findings. CT ABDOMEN and PELVIS FINDINGS Hepatobiliary: No focal hepatic lesion. No biliary duct dilatation. Common bile duct is normal. Pancreas: Pancreas is normal. No ductal dilatation. No pancreatic inflammation. Spleen: Low-density cysts within the spleen appears benign Adrenals/urinary tract: Adrenal glands and kidneys appear normal. Ureters and bladder normal. Stomach/Bowel: Stomach small normal. No complication LEFT lower quadrant related to recent appendectomy. The ascending, transverse and descending colon normal. Vascular/Lymphatic: Abdominal aorta is normal caliber. No periportal or retroperitoneal adenopathy. No pelvic adenopathy. Reproductive: A post hysterectomy. Small amount free fluid the pelvis. Abscess formation in the pelvis. No intraperitoneal free air. Other: extensive subcutaneous gas along the anterior abdominal and chest wall extending from the suprapubic region to the costal margin. Subcutaneous gas seen well on coronal image 57/series 10 Musculoskeletal: No aggressive osseous lesion.  No traumatic injury. Review of the MIP images confirms the above findings. IMPRESSION: Chest Impression: 1. Large volume intermediate density LEFT pleural effusion occupying approximately 60% of the LEFT hemithorax volume. Complete passive atelectasis of the LEFT lower lobe and lingula. 2. Small to moderate volume RIGHT pneumothorax collects anteriorly within the RIGHT hemithorax. 3. Subcutaneous gas extends along the LEFT and RIGHT chest wall up to level of the breasts. 4. No evidence acute pulmonary embolism. Abdomen / Pelvis Impression: 1. Subcutaneous gas extends along  the abdominal wall from the pubic symphysis to the costal margin on the LEFT and RIGHT. 2. No complicating features within the peritoneal space related to the appendectomy and hysterectomy. Small amount of free fluid the pelvis as expected. No organized fluid collections. No abscess. No intraperitoneal free air. Critical Value/emergent results were called by telephone at the time of interpretation on 08/20/2020 at 5:29 pm to provider Pifer, MD, who verbally acknowledged these results. Electronically Signed   By: Genevive Bi M.D.   On: 08/20/2020 17:32   US THORACENTESIS ASP PLEURAL SPACE W/IMG GUIDE  Result Date: 08/21/2020 INDICATION: Patient with a history of recent laparoscopic surgery for endometriosis, bowel resection and appendectomy presents today with left pleural effusion. Interventional radiology asked to perform a therapeutic and diagnostic thoracentesis. EXAM: ULTRASOUND GUIDED THORACENTESIS MEDICATIONS: 1% lidocaine 10 mL COMPLICATIONS: None immediate. PROCEDURE: An ultrasound guided thoracentesis was thoroughly discussed with the patient and questions answered. The benefits, risks, alternatives and complications were also discussed. The patient understands and wishes to proceed with the procedure. Written consent was obtained. Ultrasound was performed to localize and mark an adequate pocket of fluid in the left chest. The area was then prepped and draped in the normal sterile fashion. 1% Lidocaine was used for local anesthesia. Under ultrasound guidance a 6 Fr Safe-T-Centesis catheter was introduced. Thoracentesis was performed. The catheter was removed and a dressing applied. FINDINGS: A total of approximately 1.4 L of bloody fluid was removed. Samples were sent to the laboratory as requested by the clinical team. IMPRESSION: Successful ultrasound guided left thoracentesis yielding 1.4 L of pleural fluid. Read by: Alwyn Ren, NP Electronically Signed   By: Simonne Come M.D.   On: 08/21/2020  10:30    Review of Systems  Constitutional: Positive for activity change. Negative for fever.  HENT: Negative for trouble swallowing and voice change.   Respiratory: Positive for cough and shortness of breath.   Cardiovascular: Positive for chest pain (pleuritic). Negative for leg swelling.  Gastrointestinal: Positive for abdominal pain.   Blood pressure 108/62, pulse 98, temperature 97.8 F (36.6 C), temperature source Oral, resp. rate 20, height  (1.676 m), weight 56.7  kg, last menstrual period 05/18/2020, SpO2 96 %. Physical Exam Vitals reviewed.  Constitutional:      General: She is not in acute distress.    Appearance: She is well-developed.  HENT:     Head: Normocephalic and atraumatic.  Cardiovascular:     Rate and Rhythm: Normal rate and regular rhythm.     Heart sounds: No murmur heard.   Pulmonary:     Effort: Pulmonary effort is normal. No respiratory distress.     Comments: Diminished breath sounds left base Abdominal:     Palpations: Abdomen is soft.     Comments: Incisions okay  Skin:    General: Skin is warm and dry.  Neurological:     General: No focal deficit present.     Mental Status: She is alert and oriented to person, place, and time.     Cranial Nerves: No cranial nerve deficit.     Motor: No weakness.     Assessment/Plan: 35 yo woman with history of endometriosis s/p hysterectomy, appendectomy and bowel resection about 2 weeks ago in [place]. Presented to [facility] yesterday evening with shortness of breath and chest discomfort.   Right spontaneous pneumothorax-possibly iatrogenic related to surgery.  Possible she had spontaneous rupture of a bleb, but timing is interesting.  No prior history of pneumothorax.  No findings of the lung to suggest pulmonary endometriosis, but has not been completely ruled out.  Reported possibly slightly larger on her follow-up chest x-ray after thoracentesis.  I suspect this is just related to upright versus  supine position.  She is getting another chest x-ray this evening.  There is no indication for chest tube at this time.  Left pleural effusion-probably related to gynecologic surgery.  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  Possible that she had this happen while she was in the hospital in [place] and then the effusion just grew over time.  She did have shortness of breath while there but that could just be related to her abdominal surgery.  They did not do any scans or x-rays so we probably never know for sure..  In any event her symptoms did significantly worsen after short interval of improvement.  She had a thoracentesis this morning.  That drained a good portion of the fluid but there is still some residual fluid remaining.  The procedure had to be terminated due to coughing and discomfort.  The fluid was reported as being bloody.  The lab tests are pending.  There is still a moderate amount of pleural fluid left on the left side.  That may be treatable with another thoracentesis rather than requiring a chest tube.  Would await results of lab tests and follow-up chest x-rays.  I will order incentive spirometer to help with reexpanding atelectatic lung post thoracentesis.  We will follow.  Loreli SlotSteven C Corneshia Hines 08/21/2020, 11:39 AM

## 2020-08-21 NOTE — Procedures (Signed)
PROCEDURE SUMMARY:  Successful US guided left thoracentesis. Yielded 1.4 L of bloody fluid. Pt tolerated procedure well. No immediate complications.  Specimen sent for labs. CXR ordered.   EBL < 5 mL  Mickie Kay, NP 08/21/2020 9:28 AM

## 2020-08-21 NOTE — Consult Note (Signed)
NAME:  Yvonne Garcia, MRN:  [id number], DOB:  [birth year], LOS: 1 ADMISSION DATE:  08/20/2020, CONSULTATION DATE:  08/21/2020 REFERRING MD:  Jacqulyn Bath - [facility], CHIEF COMPLAINT:  Contralateral pleural effusion and pneumothorax   History of Present Illness:  35 yo F hx endometriosis POD 11, laparoscopic total hysterectomy, appendectomy and partial bowel resection (approx 6cm) and subsequent ABLA requiring hospitalization and transfusion, who presented to [facility] ED 08/20/20 with CC chest pain. Sx began while admitted post op, but improved to a degree with pulm hygiene/ IS. Progressively worsened after discharge x 2-3 days prompting ED presentation. Found to have Moderate to large L pleural effusion and small R ptx. Transferred to [facility] ED and admitted to [facility].   Underwent L thoracentesis 08/21/2020 with IR with 1.4L bloody fluid off. CXR after thora with a slight increase in R ptx.   PCCM consulted in this setting   Patient denies chest pain, shortness of breath, abdominal pain, nausea and vomiting  Pertinent  Medical History  Endometriosis   Significant Hospital Events: Including procedures, antibiotic start and stop dates in addition to other pertinent events   . 4/2 presented to [facility] with Chest pain- found to have L pleural effusion R ptx, transferred to [facility], admitted to [facility].  . 4/3 thora with IR. Slight increase in R ptx on follow up CXR. CCM consult  Interim History / Subjective:  S/p thora of 1.4L on L  Objective   Blood pressure 105/66, pulse (!) 104, temperature 98.2 F (36.8 C), temperature source Oral, resp. rate 16, height 5\' 6"  (1.676 m), weight 56.7 kg, last menstrual period 05/18/2020, SpO2 96 %.        Intake/Output Summary (Last 24 hours) at 08/21/2020 1025 Last data filed at 08/20/2020 2205 Gross per 24 hour  Intake 1936.55 ml  Output --  Net 1936.55 ml   Filed Weights   08/20/20 1451  Weight: 56.7 kg    Examination:   Physical exam: General: Young Caucasian female,  lying on the bed, not in acute distress HEENT: [place]/AT, eyes anicteric.  No JVD Neuro: Alert, awake, following commands, moving all 4 extremities spontaneously Chest: Reduced air entry on right lower lung zone, no wheezes or rhonchi Heart: Tachycardic, regular rhythm, no murmurs or gallops Abdomen: Soft, nontender, nondistended, bowel sounds present Skin: No rash  Labs/imaging that I havepersonally reviewed  (right click and "Reselect all SmartList Selections" daily)  4/2 CTA chest> large L pleural effusion, small-moderate R ptx. Subcutaneous air in anterior chest wall 4/2 CT a/p> splenic cysts, s/p hysterectomy, small about of pelvic free fluid,  subcutaneous gas along anterior abdominal and chest wall    4/3 CXR> improved L pleural effusion. R ptx   CBC- WBC 13.3 hgb 9.9 plt 549 CMP- pretty unremarkable CK 22 LDH 266 Resolved Hospital Problem list     Assessment & Plan:   L pleural effusion s/p thora 4/3 with IR R pneumothorax Follow up pleural labs  Continue antibiotics for now Patient is asymptomatic from right-sided small pneumothorax Her oxygen saturation is above 95% on room air Continue nasal cannula oxygen 6 to 8 L, to try reabsorb pneumothorax Repeat x-ray chest at 4 PM to see the progress, if remained stable we will repeat x-ray in the morning, but if it is increasing then will have to put anterior pigtail chest tube.  PCCM will follow along, please call with question  Best practice (right click and "Reselect all SmartList Selections" daily)  Diet:  Oral Pain/Anxiety/Delirium protocol (if  indicated): No VAP protocol (if indicated): Not indicated DVT prophylaxis: SCD GI prophylaxis: N/A Glucose control:  SSI No Central venous access:  N/A Arterial line:  N/A Foley:  N/A Mobility:  OOB  PT consulted: N/A Last date of multidisciplinary goals of care discussion [per primary] Code Status:  full code Disposition: Progressive  Labs   CBC: Recent Labs  Lab  08/20/20 1502 08/21/20 0200  WBC 13.1* 13.3*  NEUTROABS 9.0* 9.1*  HGB 10.8* 9.9*  HCT 33.0* 30.0*  MCV 86.4 87.0  PLT 440* 547*    Basic Metabolic Panel: Recent Labs  Lab 08/20/20 1502 08/21/20 0200  NA 136 135  K 4.0 3.8  CL 101 104  CO2 24 24  GLUCOSE 93 133*  BUN 8 <5*  CREATININE 0.49 0.54  CALCIUM 8.9 8.5*  MG  --  2.1  PHOS  --  2.7   GFR: Estimated Creatinine Clearance: 86.2 mL/min (by C-G formula based on SCr of 0.54 mg/dL). Recent Labs  Lab 08/20/20 1502 08/20/20 1751 08/20/20 2301 08/21/20 0200  PROCALCITON  --   --  <0.10  --   WBC 13.1*  --   --  13.3*  LATICACIDVEN  --  0.7  --   --     Liver Function Tests: Recent Labs  Lab 08/20/20 1502 08/21/20 0200  AST 59* 37  ALT 54* 46*  ALKPHOS 63 59  BILITOT 1.0 1.2  PROT 7.0 5.9*  ALBUMIN 3.8 2.8*   Recent Labs  Lab 08/20/20 1502  LIPASE <10*   No results for input(s): AMMONIA in the last 168 hours.  ABG No results found for: PHART, PCO2ART, PO2ART, HCO3, TCO2, ACIDBASEDEF, O2SAT   Coagulation Profile: Recent Labs  Lab 08/20/20 1751  INR 1.1    Cardiac Enzymes: Recent Labs  Lab 08/20/20 2301  CKTOTAL 22*    HbA1C: No results found for: HGBA1C  CBG: No results for input(s): GLUCAP in the last 168 hours.  Review of Systems:   12 point review of system is significant for complaint mentioned in HPI, rest is negative  Past Medical History:  She,  has a past medical history of Allergies, Anxiety, Family history of breast cancer (05/05/2020), Family history of colon cancer (05/05/2020), Family history of kidney cancer (05/05/2020), Family history of pancreatic cancer (05/05/2020), and Internal hemorrhoids.   Surgical History:   Past Surgical History:  Procedure Laterality Date  . ABDOMINAL HYSTERECTOMY    . APPENDECTOMY    . LAPAROSCOPIC BOWEL RESECTION    . TONSILLECTOMY    . WISDOM TOOTH EXTRACTION       Social History:   reports that she has never smoked. She has  never used smokeless tobacco. She reports current alcohol use. She reports that she does not use drugs.   Family History:  Her family history includes Breast cancer (age of onset: 51) in her mother; Colon cancer in her maternal grandfather; Kidney cancer in her paternal grandfather; Kidney cancer (age of onset: 4) in her father; Mental illness in her mother; Pancreatic cancer (age of onset: 71) in her mother.   Allergies No Known Allergies   Home Medications  Prior to Admission medications   Medication Sig Start Date End Date Taking? Authorizing Provider  acetaminophen (TYLENOL) 500 MG tablet Take 1,000 mg by mouth every 6 (six) hours as needed for moderate pain or headache.   Yes [provider]  cholecalciferol (VITAMIN D3) 25 MCG (1000 UNIT) tablet Take 1,000 Units by mouth daily.  Yes [provider]  clonazePAM (KLONOPIN) 0.5 MG tablet Take 1 tablet by mouth daily as needed for anxiety. 03/11/16  Yes [provider]  docusate sodium (COLACE) 100 MG capsule Take 100 mg by mouth daily.   Yes [provider]  Ferrous Sulfate (IRON PO) Take 1 each by mouth daily.   Yes [provider]  fluticasone (FLONASE) 50 MCG/ACT nasal spray Place 1 spray into both nostrils daily as needed for allergies.   Yes [provider]  Simethicone (GAS-X PO) Take 1 tablet by mouth daily as needed (gas/bloating).   Yes [provider]  vitamin B-12 (CYANOCOBALAMIN) 1000 MCG tablet Take 1,000 mcg by mouth daily.   Yes [provider]  ZOFRAN 4 MG tablet Take 4 mg by mouth every 6 (six) hours as needed for nausea or vomiting. 08/15/20  Yes [provider]  dicyclomine (BENTYL) 20 MG tablet Take 1 tablet (20 mg total) by mouth 3 (three) times daily as needed for spasms. Patient not taking: No sig reported 01/29/20   Pyrtle, Carie Caddy, MD  Vitamin D, Ergocalciferol, (DRISDOL) 1.25 MG (50000 UNIT) CAPS capsule Take 50,000 Units by  mouth. Patient not taking: No sig reported 04/05/20   [provider]      Cheri Fowler MD [facility] See Amion for pager If no response to pager, please call [phone] until 7pm After 7pm, Please call E-link [phone]

## 2020-08-22 ENCOUNTER — Telehealth: Payer: Self-pay | Admitting: *Deleted

## 2020-08-22 ENCOUNTER — Inpatient Hospital Stay (HOSPITAL_COMMUNITY): Payer: No Typology Code available for payment source

## 2020-08-22 DIAGNOSIS — J9311 Primary spontaneous pneumothorax: Secondary | ICD-10-CM

## 2020-08-22 DIAGNOSIS — J90 Pleural effusion, not elsewhere classified: Secondary | ICD-10-CM | POA: Diagnosis not present

## 2020-08-22 DIAGNOSIS — D509 Iron deficiency anemia, unspecified: Secondary | ICD-10-CM

## 2020-08-22 DIAGNOSIS — J9383 Other pneumothorax: Secondary | ICD-10-CM

## 2020-08-22 LAB — CBC WITH DIFFERENTIAL/PLATELET
Abs Immature Granulocytes: 0.18 10*3/uL — ABNORMAL HIGH (ref 0.00–0.07)
Basophils Absolute: 0.1 10*3/uL (ref 0.0–0.1)
Basophils Relative: 0 %
Eosinophils Absolute: 1.3 10*3/uL — ABNORMAL HIGH (ref 0.0–0.5)
Eosinophils Relative: 11 %
HCT: 29.3 % — ABNORMAL LOW (ref 36.0–46.0)
Hemoglobin: 9.3 g/dL — ABNORMAL LOW (ref 12.0–15.0)
Immature Granulocytes: 2 %
Lymphocytes Relative: 23 %
Lymphs Abs: 2.5 10*3/uL (ref 0.7–4.0)
MCH: 28.9 pg (ref 26.0–34.0)
MCHC: 31.7 g/dL (ref 30.0–36.0)
MCV: 91 fL (ref 80.0–100.0)
Monocytes Absolute: 0.8 10*3/uL (ref 0.1–1.0)
Monocytes Relative: 7 %
Neutro Abs: 6.4 10*3/uL (ref 1.7–7.7)
Neutrophils Relative %: 57 %
Platelets: 524 10*3/uL — ABNORMAL HIGH (ref 150–400)
RBC: 3.22 MIL/uL — ABNORMAL LOW (ref 3.87–5.11)
RDW: 14.1 % (ref 11.5–15.5)
WBC: 11.2 10*3/uL — ABNORMAL HIGH (ref 4.0–10.5)
nRBC: 0 % (ref 0.0–0.2)

## 2020-08-22 LAB — PH, BODY FLUID: pH, Body Fluid: 7.2

## 2020-08-22 LAB — TRIGLYCERIDES, BODY FLUIDS: Triglycerides, Fluid: 101 mg/dL

## 2020-08-22 LAB — BASIC METABOLIC PANEL
Anion gap: 9 (ref 5–15)
BUN: 7 mg/dL (ref 6–20)
CO2: 23 mmol/L (ref 22–32)
Calcium: 8.6 mg/dL — ABNORMAL LOW (ref 8.9–10.3)
Chloride: 105 mmol/L (ref 98–111)
Creatinine, Ser: 0.6 mg/dL (ref 0.44–1.00)
GFR, Estimated: >60 mL/min (ref >60)
Glucose, Bld: 84 mg/dL (ref 70–99)
Potassium: 4.1 mmol/L (ref 3.5–5.1)
Sodium: 137 mmol/L (ref 135–145)

## 2020-08-22 LAB — CYTOLOGY - NON PAP

## 2020-08-22 IMAGING — CR DG CHEST 2V
2 series · 2 of 2 positions shown · non-contrast
Comparison: [DATE]

CLINICAL DATA: Pleural effusion

Pneumothorax
EXAM:
CHEST - 2 VIEW

[chest pa]
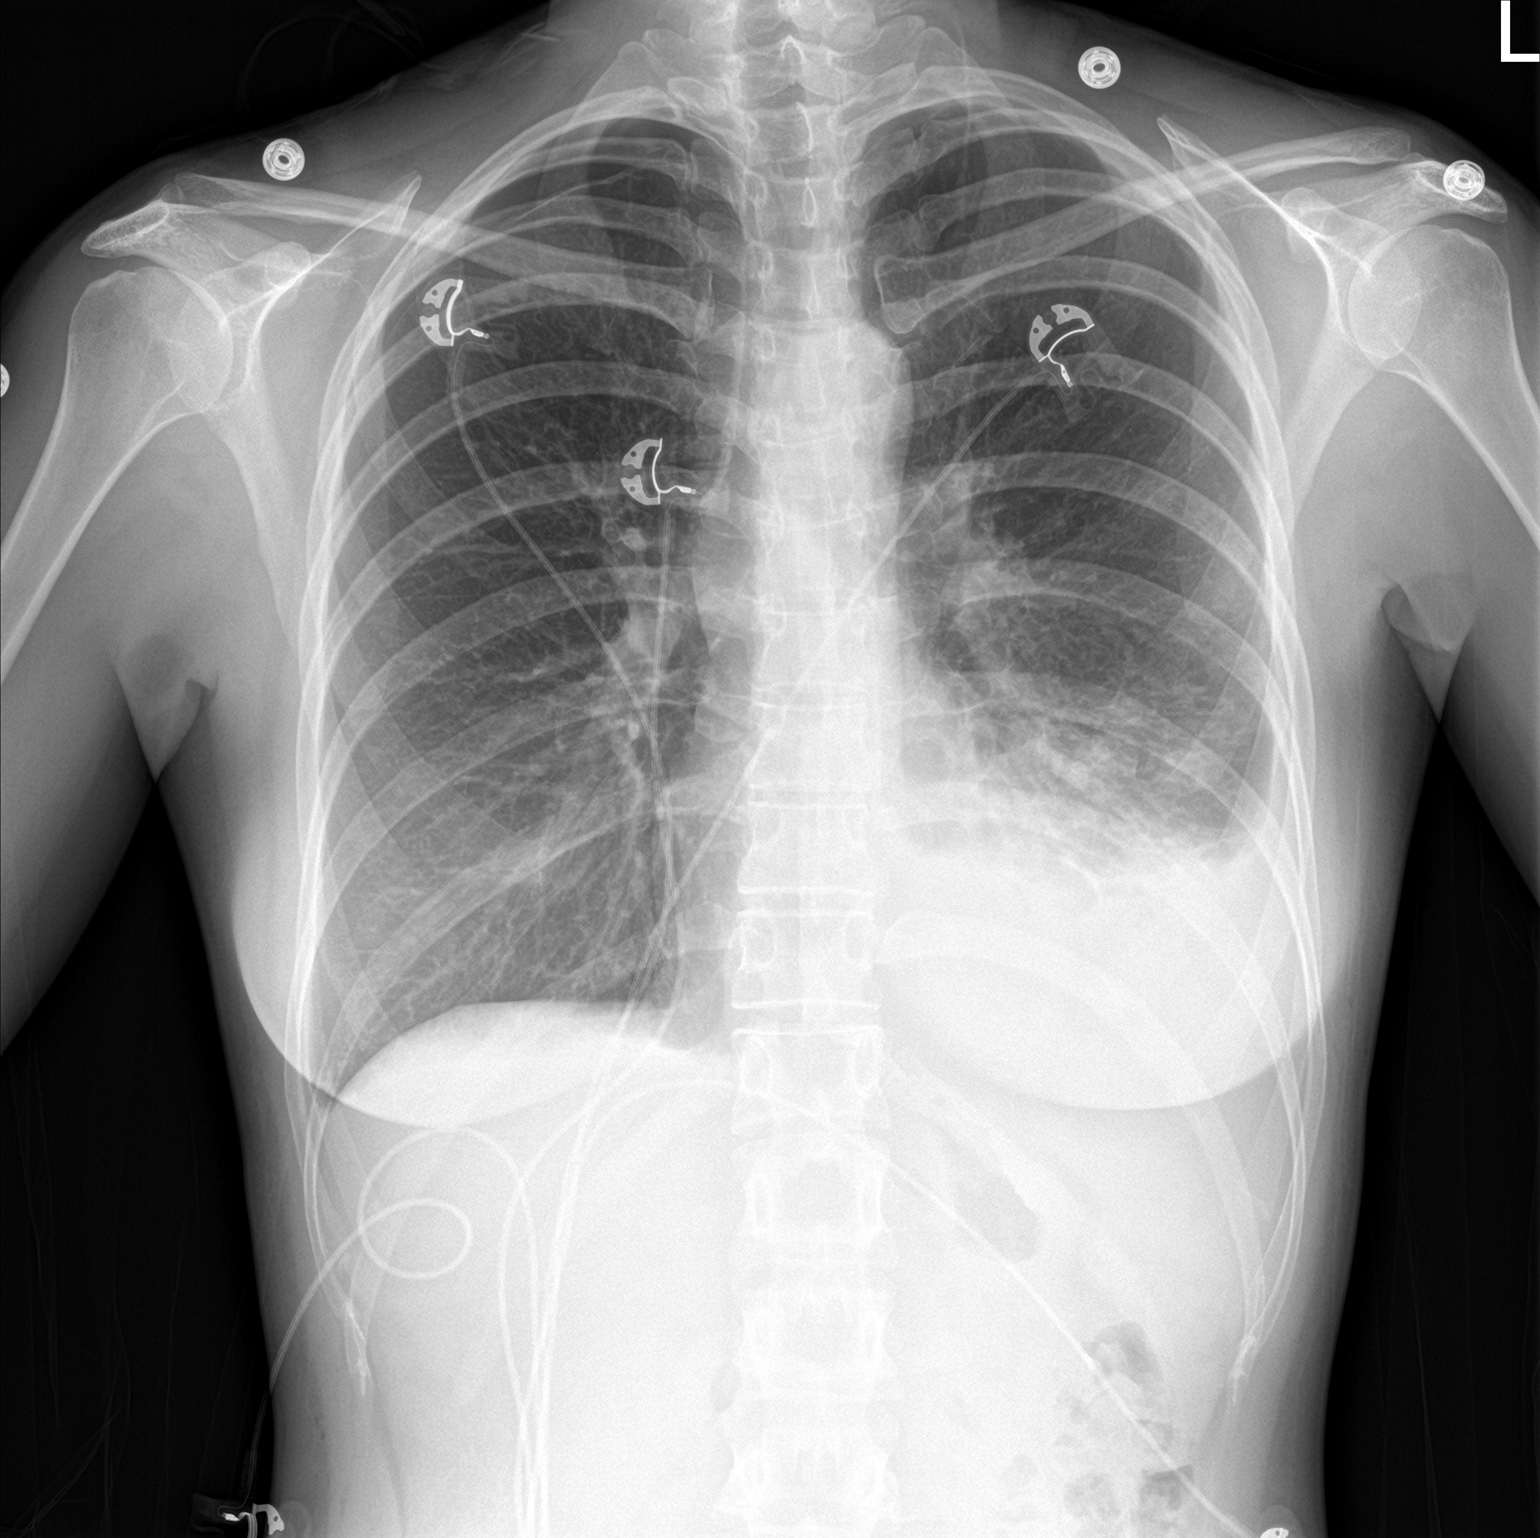

[chest lat]
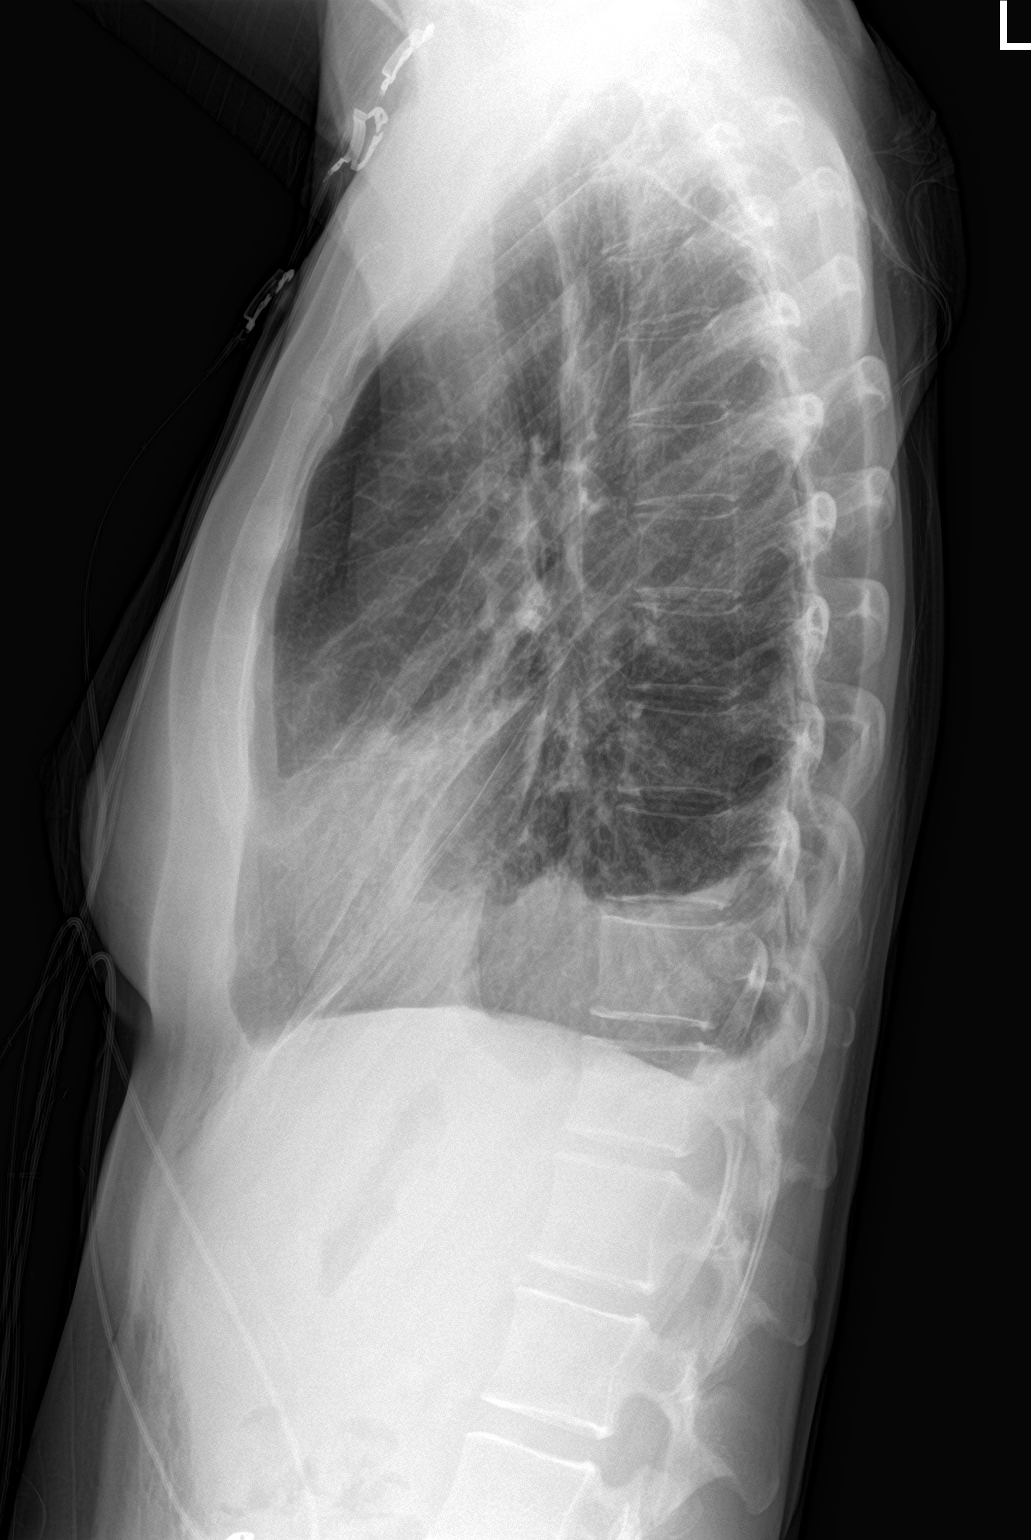

[2 of 2 positions shown; findings below may reference images not displayed]

FINDINGS: Cardiomediastinal silhouette and pulmonary vasculature are within
normal limits.

Right apical pneumothorax is not significantly changed in size. Left
pleural effusion is unchanged since prior exam.
IMPRESSION: 1. Minimal right apical pneumothorax unchanged since prior exam.
2. Small to moderate left pleural effusion unchanged from prior
exam.

## 2020-08-22 IMAGING — DX DG CHEST 1V PORT
1 series · 1 of 1 positions shown · non-contrast
Comparison: Chest radiograph dated [DATE].

CLINICAL DATA: 35-year-old female with pneumothorax.

EXAM:
PORTABLE CHEST 1 VIEW

[chest]
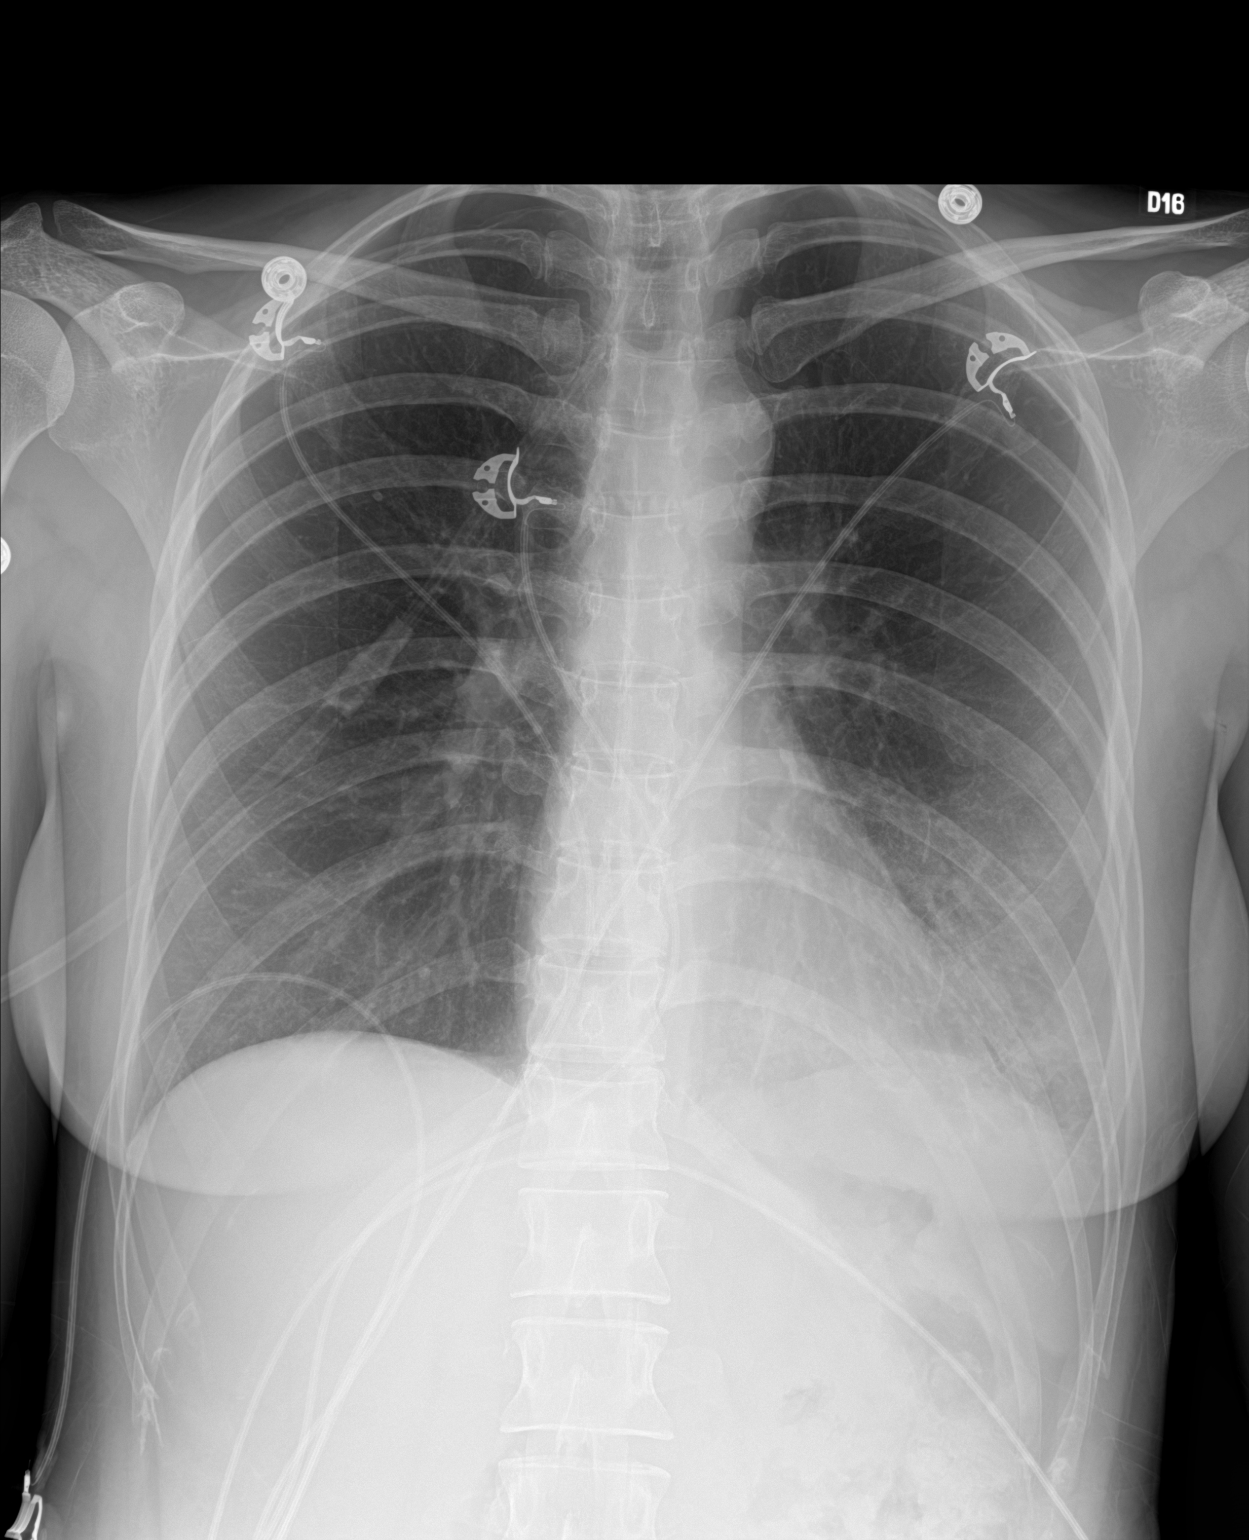

[1 of 1 positions shown; findings below may reference images not displayed]

FINDINGS: No significant interval change in the right apical pneumothorax
since the earlier radiograph. Small left pleural effusion and left
lung base atelectasis. Stable cardiomediastinal silhouette. No acute
osseous pathology.
IMPRESSION: No significant interval change in the right apical pneumothorax
since the earlier radiograph.

## 2020-08-22 MED ORDER — FENTANYL CITRATE (PF) 100 MCG/2ML IJ SOLN
INTRAMUSCULAR | Status: AC
  Administered 2020-08-22: 50 ug
  Filled 2020-08-22: qty 2

## 2020-08-22 MED ORDER — FENTANYL CITRATE (PF) 100 MCG/2ML IJ SOLN: 50.0000 ug | Freq: Once | INTRAMUSCULAR | Status: DC

## 2020-08-22 NOTE — Progress Notes (Signed)
Assisted with Bedside Thoracentesis with Dr Merrily Pew Patient tolerated well

## 2020-08-22 NOTE — Telephone Encounter (Signed)
-----   Message from Cheri Fowler, MD sent at 08/22/2020  5:04 PM EDT ----- Regarding: Appointment with any provider on Friday. With CXR for pleural effusion and PTX f/u

## 2020-08-22 NOTE — Procedures (Signed)
Thoracentesis  Procedure Note  Yvonne Garcia  [id number]  05-07-1986  Date:08/22/20  Time:3:08 PM   Provider Performing:Treylon Henard   Procedure: Thoracentesis with imaging guidance (74081)  Indication(s) Pleural Effusion  Consent Risks of the procedure as well as the alternatives and risks of each were explained to the patient and/or caregiver.  Consent for the procedure was obtained and is signed in the bedside chart  Anesthesia Topical only with 1% lidocaine    Time Out Verified patient identification, verified procedure, site/side was marked, verified correct patient position, special equipment/implants available, medications/allergies/relevant history reviewed, required imaging and test results available.   Sterile Technique Maximal sterile technique including full sterile barrier drape, hand hygiene, sterile gown, sterile gloves, mask, hair covering, sterile ultrasound probe cover (if used).  Procedure Description Ultrasound was used to identify appropriate pleural anatomy for placement and overlying skin marked.  Area of drainage cleaned and draped in sterile fashion. Lidocaine was used to anesthetize the skin and subcutaneous tissue.  600 cc's of dark bloody appearing fluid was drained from the left pleural space. Catheter then removed and bandaid applied to site.   Complications/Tolerance None; patient tolerated the procedure well. Chest X-ray is ordered to confirm no post-procedural complication.   EBL Minimal   Specimen(s) Pleural fluid

## 2020-08-22 NOTE — Telephone Encounter (Signed)
Appointment with any provider on Friday. With CXR for pleural effusion and PTX f/u

## 2020-08-22 NOTE — Progress Notes (Signed)
NAME:  Yvonne Garcia, MRN:  [id number], DOB:  08-19-1985, LOS: 2 ADMISSION DATE:  08/20/2020, CONSULTATION DATE:  08/21/2020 REFERRING MD:  Jacqulyn Bath - [facility], CHIEF COMPLAINT:  Contralateral pleural effusion and pneumothorax   History of Present Illness:  35 yo F hx endometriosis POD 11, laparoscopic total hysterectomy, appendectomy and partial bowel resection (approx 6cm) and subsequent ABLA requiring hospitalization and transfusion, who presented to [facility] ED 08/20/20 with CC chest pain. Sx began while admitted post op, but improved to a degree with pulm hygiene/ IS. Progressively worsened after discharge x 2-3 days prompting ED presentation. Found to have Moderate to large L pleural effusion and small R ptx. Transferred to [facility] ED and admitted to [facility].   Underwent L thoracentesis 08/21/2020 with IR with 1.4L bloody fluid off. CXR after thora with a slight increase in R ptx.   PCCM consulted in this setting   Patient denies chest pain, shortness of breath, abdominal pain, nausea and vomiting  Pertinent  Medical History  Endometriosis   Significant Hospital Events: Including procedures, antibiotic start and stop dates in addition to other pertinent events   . 4/2 presented to [facility] with Chest pain- found to have L pleural effusion R ptx, transferred to [facility], admitted to [facility].  . 4/3 thora with IR. Slight increase in R ptx on follow up CXR. CCM consult  Interim History / Subjective:  Patient stated intermittently she gets chest pain with deep breathing but otherwise feeling better, no shortness of breath  Repeat x-ray chest showed stable very small right pneumothorax, moderate left-sided effusion  Objective   Blood pressure 103/68, pulse 81, temperature 98 F (36.7 C), temperature source Oral, resp. rate 16, height 5\' 6"  (1.676 m), weight 56.7 kg, last menstrual period 05/18/2020, SpO2 100 %.        Intake/Output Summary (Last 24 hours) at 08/22/2020 1149 Last data filed at 08/21/2020  2345 Gross per 24 hour  Intake 600 ml  Output --  Net 600 ml   Filed Weights   08/20/20 1451  Weight: 56.7 kg    Examination: Physical exam: General: Young Caucasian female, sitting on the bed, not in acute distress HEENT: [place]/AT, eyes anicteric.  No JVD Neuro: Alert, awake, following commands, moving all 4 extremities spontaneously Chest: Absent air entry on left lower lung zone, no wheezes or rhonchi Heart: Tachycardic, regular rhythm, no murmurs or gallops Abdomen: Soft, nontender, nondistended, bowel sounds present Skin: No rash  Labs/imaging that I havepersonally reviewed  (right click and "Reselect all SmartList Selections" daily)  4/2 CTA chest> large L pleural effusion, small-moderate R ptx. Subcutaneous air in anterior chest wall 4/2 CT a/p> splenic cysts, s/p hysterectomy, small about of pelvic free fluid,  subcutaneous gas along anterior abdominal and chest wall    4/3 CXR> improved L pleural effusion. R ptx   Pleural effusion studies consistent with exudative effusion Resolved Hospital Problem list     Assessment & Plan:   L pleural effusion likely traumatic during abdominal surgery s/p thora 4/3 with IR R pneumothorax, spontaneous Pleural fluid labs are consistent with exudative effusion Continue antibiotics per primary team Patient is asymptomatic from right-sided small pneumothorax, repeat x-ray chest showed stability of right sided small pneumothorax, Continue nasal cannula oxygen 6 to 8 L, to try reabsorb pneumothorax I do not think patient will need chest tube to drain this pneumothorax I think patient will need repeat thoracentesis to remove rest of the left-sided pleural effusion  PCCM will follow along, please call  with question  Best practice (right click and "Reselect all SmartList Selections" daily)  Diet:  Oral Pain/Anxiety/Delirium protocol (if indicated): No VAP protocol (if indicated): Not indicated DVT prophylaxis: SCD GI prophylaxis:  N/A Glucose control:  SSI No Central venous access:  N/A Arterial line:  N/A Foley:  N/A Mobility:  OOB  PT consulted: N/A Last date of multidisciplinary goals of care discussion [per primary] Code Status:  full code Disposition: Progressive  Labs   CBC: Recent Labs  Lab 08/20/20 1502 08/21/20 0200 08/22/20 0423  WBC 13.1* 13.3* 11.2*  NEUTROABS 9.0* 9.1* 6.4  HGB 10.8* 9.9* 9.3*  HCT 33.0* 30.0* 29.3*  MCV 86.4 87.0 91.0  PLT 440* 547* 524*    Basic Metabolic Panel: Recent Labs  Lab 08/20/20 1502 08/21/20 0200 08/22/20 0423  NA 136 135 137  K 4.0 3.8 4.1  CL 101 104 105  CO2 24 24 23   GLUCOSE 93 133* 84  BUN 8 <5* 7  CREATININE 0.49 0.54 0.60  CALCIUM 8.9 8.5* 8.6*  MG  --  2.1  --   PHOS  --  2.7  --    GFR: Estimated Creatinine Clearance: 86.2 mL/min (by C-G formula based on SCr of 0.6 mg/dL). Recent Labs  Lab 08/20/20 1502 08/20/20 1751 08/20/20 2301 08/21/20 0200 08/22/20 0423  PROCALCITON  --   --  <0.10  --   --   WBC 13.1*  --   --  13.3* 11.2*  LATICACIDVEN  --  0.7  --   --   --     Liver Function Tests: Recent Labs  Lab 08/20/20 1502 08/21/20 0200  AST 59* 37  ALT 54* 46*  ALKPHOS 63 59  BILITOT 1.0 1.2  PROT 7.0 5.9*  ALBUMIN 3.8 2.8*   Recent Labs  Lab 08/20/20 1502  LIPASE <10*   No results for input(s): AMMONIA in the last 168 hours.  ABG No results found for: PHART, PCO2ART, PO2ART, HCO3, TCO2, ACIDBASEDEF, O2SAT   Coagulation Profile: Recent Labs  Lab 08/20/20 1751  INR 1.1    Cardiac Enzymes: Recent Labs  Lab 08/20/20 2301  CKTOTAL 22*    HbA1C: No results found for: HGBA1C  CBG: No results for input(s): GLUCAP in the last 168 hours.   10/20/20 MD [facility] See Amion for pager If no response to pager, please call [phone] until 7pm After 7pm, Please call E-link [phone]

## 2020-08-22 NOTE — Progress Notes (Signed)
PROGRESS NOTE    Yvonne Garcia  ZOX:[id number] DOB: December 27, 1985 DOA: 08/20/2020 PCP: Deatra James, MD   Brief Narrative:  Yvonne Garcia is a 35 year old female with a history of endometriosis, anxiety and iron deficiency who underwent laparoscopic surgery in [place] about 10 days ago with removal of significant endometriosis and included a bowel resection and appendectomy presented to ED was is: On 08/20/2020 with a complaint of chest pain.  She was diagnosed with large left-sided pleural effusion and mild to moderate right-sided pneumothorax.  Case was discussed with CT surgery by ED physician who had recommended thoracentesis.  Patient was admitted to hospital service.  She underwent thoracentesis on 08/21/2020 with removal of bloody 1.4 L of fluid.  Post thoracentesis x-ray showed slightly worsened right-sided pneumothorax.  No left-sided pneumothorax.  PCCM was consulted.  Patient was seen by CT surgery as well.  No need of chest tube per PCCM and CT surgery.  Assessment & Plan:   Active Problems:   Iron deficiency anemia   Pneumothorax   Pleural effusion   SIRS (systemic inflammatory response syndrome) (HCC)  Large left-sided pleural effusion: Patient is status post therapeutic and diagnostic thoracentesis on 08/21/2020.  Labs consistent with exudative pleural effusion.  Has high white cells and nucleated cells, indicating possible infection.  Will continue current antibiotics.  She is still has some pleural effusion in the left but she is comfortable.  Will defer to PCCM if further thoracentesis is indicated.  Patient has been placed on oxygen only in order to resolve pneumothorax.  She is not hypoxic otherwise.   Mild to moderate right-sided pneumothorax: Appears stable on the x-ray this morning.  Per PCCM and CT surgery, no indication of chest tube placement.  We will repeat x-ray tomorrow.  Patient comfortable.  Iron deficiency anemia: Hemoglobin 9.9.  Monitor daily.  Will start on iron  tablets.  DVT prophylaxis: SCDs Start: 08/20/20 2207   Code Status: Full Code  Family Communication: None present at bedside.  Plan of care discussed with patient in length and he verbalized understanding and agreed with it.  Status is: Inpatient  Remains inpatient appropriate because:Inpatient level of care appropriate due to severity of illness   Dispo: The patient is from: Home              Anticipated d/c is to: Home              Patient currently is not medically stable to d/c.   Difficult to place patient No        Estimated body mass index is 20.18 kg/m as calculated from the following:   Height as of this encounter:  (1.676 m).   Weight as of this encounter: 56.7 kg.      Nutritional status:               Consultants:   PCCM and CT surgery  Procedures:   Thoracentesis 08/21/2020  Antimicrobials:  Anti-infectives (From admission, onward)   Start     Dose/Rate Route Frequency Ordered Stop   08/21/20 0800  vancomycin (VANCOREADY) IVPB 750 mg/150 mL  Status:  Discontinued        750 mg 150 mL/hr over 60 Minutes Intravenous Every 12 hours 08/20/20 1846 08/22/20 0714   08/21/20 0600  ceFEPIme (MAXIPIME) 2 g in sodium chloride 0.9 % 100 mL IVPB        2 g 200 mL/hr over 30 Minutes Intravenous Every 8 hours 08/20/20 2215  08/20/20 1845  ceFEPIme (MAXIPIME) 2 g in sodium chloride 0.9 % 100 mL IVPB        2 g 200 mL/hr over 30 Minutes Intravenous  Once 08/20/20 1835 08/20/20 2046   08/20/20 1845  vancomycin (VANCOCIN) IVPB 1000 mg/200 mL premix        1,000 mg 200 mL/hr over 60 Minutes Intravenous  Once 08/20/20 1842 08/20/20 2253         Subjective: Seen and examined this morning.  No complaints.  Shortness of breath has resolved.  Objective: Vitals:   08/21/20 2336 08/22/20 0430 08/22/20 0834 08/22/20 1136  BP: 103/66 94/61 103/68 98/65  Pulse: 93 78 81 92  Resp: 20 16 16 17   Temp: 98 F (36.7 C) 98 F (36.7 C) 98 F (36.7 C) 98  F (36.7 C)  TempSrc: Oral Oral Oral Oral  SpO2: 100% 100% 100% 99%  Weight:      Height:        Intake/Output Summary (Last 24 hours) at 08/22/2020 1329 Last data filed at 08/21/2020 2345 Gross per 24 hour  Intake 600 ml  Output --  Net 600 ml   Filed Weights   08/20/20 1451  Weight: 56.7 kg    Examination:  General exam: Appears calm and comfortable  Respiratory system: Diminished breath sounds bibasilar, left greater than right with some rhonchi just above that area bilaterally. Respiratory effort normal. Cardiovascular system: S1 & S2 heard, RRR. No JVD, murmurs, rubs, gallops or clicks. No pedal edema. Gastrointestinal system: Abdomen is nondistended, soft and nontender. No organomegaly or masses felt. Normal bowel sounds heard. Central nervous system: Alert and oriented. No focal neurological deficits. Extremities: Symmetric 5 x 5 power. Skin: No rashes, lesions or ulcers.  Psychiatry: Judgement and insight appear normal. Mood & affect appropriate.    Data Reviewed: I have personally reviewed following labs and imaging studies  CBC: Recent Labs  Lab 08/20/20 1502 08/21/20 0200 08/22/20 0423  WBC 13.1* 13.3* 11.2*  NEUTROABS 9.0* 9.1* 6.4  HGB 10.8* 9.9* 9.3*  HCT 33.0* 30.0* 29.3*  MCV 86.4 87.0 91.0  PLT 440* 547* 524*   Basic Metabolic Panel: Recent Labs  Lab 08/20/20 1502 08/21/20 0200 08/22/20 0423  NA 136 135 137  K 4.0 3.8 4.1  CL 101 104 105  CO2 24 24 23   GLUCOSE 93 133* 84  BUN 8 <5* 7  CREATININE 0.49 0.54 0.60  CALCIUM 8.9 8.5* 8.6*  MG  --  2.1  --   PHOS  --  2.7  --    GFR: Estimated Creatinine Clearance: 86.2 mL/min (by C-G formula based on SCr of 0.6 mg/dL). Liver Function Tests: Recent Labs  Lab 08/20/20 1502 08/21/20 0200  AST 59* 37  ALT 54* 46*  ALKPHOS 63 59  BILITOT 1.0 1.2  PROT 7.0 5.9*  ALBUMIN 3.8 2.8*   Recent Labs  Lab 08/20/20 1502  LIPASE <10*   No results for input(s): AMMONIA in the last 168  hours. Coagulation Profile: Recent Labs  Lab 08/20/20 1751  INR 1.1   Cardiac Enzymes: Recent Labs  Lab 08/20/20 2301  CKTOTAL 22*   BNP (last 3 results) No results for input(s): PROBNP in the last 8760 hours. HbA1C: No results for input(s): HGBA1C in the last 72 hours. CBG: No results for input(s): GLUCAP in the last 168 hours. Lipid Profile: No results for input(s): CHOL, HDL, LDLCALC, TRIG, CHOLHDL, LDLDIRECT in the last 72 hours. Thyroid Function Tests: Recent Labs  08/21/20 0200  TSH 2.513   Anemia Panel: Recent Labs    08/20/20 2301  VITAMINB12 691  FOLATE 17.9  FERRITIN 288  TIBC 231*  IRON 17*  RETICCTPCT 2.3   Sepsis Labs: Recent Labs  Lab 08/20/20 1751 08/20/20 2301  PROCALCITON  --  <0.10  LATICACIDVEN 0.7  --     Recent Results (from the past 240 hour(s))  Culture, blood (routine x 2)     Status: None (Preliminary result)   Collection Time: 08/20/20  5:55 PM   Specimen: BLOOD  Result Value Ref Range Status   Specimen Description   Final    BLOOD RIGHT ANTECUBITAL Performed at [facility]    Special Requests   Final    BOTTLES DRAWN AEROBIC AND ANAEROBIC Blood Culture adequate volume Performed at [facility]    Culture   Final    NO GROWTH < 24 HOURS Performed at [facility], [address] [place], [place] [address]    Report Status PENDING  Incomplete  Culture, blood (routine x 2)     Status: None (Preliminary result)   Collection Time: 08/20/20  6:16 PM   Specimen: BLOOD  Result Value Ref Range Status   Specimen Description   Final    BLOOD LEFT WRIST Performed at [facility]    Special Requests   Final    BOTTLES DRAWN AEROBIC ONLY Blood Culture adequate volume Performed at [facility]    Culture   Final    NO GROWTH < 24 HOURS Performed at [facility], [address], [place], [place] [address]    Report Status PENDING  Incomplete   Resp Panel by RT-PCR (Flu A&B, Covid) Nasopharyngeal Swab     Status: None   Collection Time: 08/20/20  8:12 PM   Specimen: Nasopharyngeal Swab; Nasopharyngeal(NP) swabs in vial transport medium  Result Value Ref Range Status   SARS Coronavirus 2 by RT PCR NEGATIVE NEGATIVE Final    Comment: (NOTE) SARS-CoV-2 target nucleic acids are NOT DETECTED.  The SARS-CoV-2 RNA is generally detectable in upper respiratory specimens during the acute phase of infection. The lowest concentration of SARS-CoV-2 viral copies this assay can detect is 138 copies/mL. A negative result does not preclude SARS-Cov-2 infection and should not be used as the sole basis for treatment or other patient management decisions. A negative result may occur with  improper specimen collection/handling, submission of specimen other than nasopharyngeal swab, presence of viral mutation(s) within the areas targeted by this assay, and inadequate number of viral copies(<138 copies/mL). A negative result must be combined with clinical observations, patient history, and epidemiological information. The expected result is Negative.  Fact Sheet for Patients:  BloggerCourse.com  Fact Sheet for Healthcare Providers:  SeriousBroker.it  This test is no t yet approved or cleared by the [place] FDA and  has been authorized for detection and/or diagnosis of SARS-CoV-2 by FDA under an Emergency Use Authorization (EUA). This EUA will remain  in effect (meaning this test can be used) for the duration of the COVID-19 declaration under Section 564(b)(1) of the Act, 21 U.S.C.section 360bbb-3(b)(1), unless the authorization is terminated  or revoked sooner.       Influenza A by PCR NEGATIVE NEGATIVE Final   Influenza B by PCR NEGATIVE NEGATIVE Final    Comment: (NOTE) The Xpert Xpress SARS-CoV-2/FLU/RSV plus assay is intended as an aid in the diagnosis of influenza from  Nasopharyngeal  swab specimens and should not be used as a sole basis for treatment. Nasal washings and aspirates are unacceptable for Xpert Xpress SARS-CoV-2/FLU/RSV testing.  Fact Sheet for Patients: BloggerCourse.com  Fact Sheet for Healthcare Providers: SeriousBroker.it  This test is not yet approved or cleared by the [place] FDA and has been authorized for detection and/or diagnosis of SARS-CoV-2 by FDA under an Emergency Use Authorization (EUA). This EUA will remain in effect (meaning this test can be used) for the duration of the COVID-19 declaration under Section 564(b)(1) of the Act, 21 U.S.C. section 360bbb-3(b)(1), unless the authorization is terminated or revoked.  Performed at [facility]   Surgical pcr screen     Status: None   Collection Time: 08/21/20  3:13 AM   Specimen: Nasal Mucosa; Nasal Swab  Result Value Ref Range Status   MRSA, PCR NEGATIVE NEGATIVE Final   Staphylococcus aureus NEGATIVE NEGATIVE Final    Comment: (NOTE) The Xpert SA Assay (FDA approved for NASAL specimens in patients 45 years of age and older), is one component of a comprehensive surveillance program. It is not intended to diagnose infection nor to guide or monitor treatment. Performed at [facility], [address], [place], [place] [address]   Body fluid culture w Gram Stain     Status: None (Preliminary result)   Collection Time: 08/21/20  9:13 AM   Specimen: PATH Cytology Pleural fluid  Result Value Ref Range Status   Specimen Description PLEURAL FLUID  Final   Special Requests NONE  Final   Gram Stain   Final    RARE WBC PRESENT, PREDOMINANTLY MONONUCLEAR RARE GRAM POSITIVE COCCI IN PAIRS FEW GRAM VARIABLE ROD    Culture   Final    NO GROWTH < 24 HOURS Performed at [facility], [address] [place], [place] [address]    Report Status PENDING  Incomplete      Radiology Studies: DG  Chest 1 View  Addendum Date: 08/21/2020   ADDENDUM REPORT: 08/21/2020 10:32 ADDENDUM: Study discussed by telephone with Dr. Jacqulyn Bath, hospitalist service on 08/21/2020 at 1028 hours. We discussed placing the patient on oxygen with repeat x-ray this afternoon to re-evaluate the right side pneumothorax. Electronically Signed   By: Odessa Fleming M.D.   On: 08/21/2020 10:32   Result Date: 08/21/2020 CLINICAL DATA:  35 year old female status post ultrasound-guided left side thoracentesis this morning. EXAM: CHEST  1 VIEW COMPARISON:  CTA chest yesterday. FINDINGS: Standing PA view of the chest at 0908 hours. Substantially regressed left side pleural effusion with no pneumothorax identified. Mediastinal contours remain normal. Contralateral right side pneumothorax is increased since yesterday, now small to moderate (pleural edge at the right posterior 4th rib level now. Right lung otherwise clear. No osseous abnormality identified. Negative visible bowel gas pattern. IMPRESSION: 1. No left-side pneumothorax following thoracentesis but the right side pneumothorax appears larger since the CTA yesterday, small to moderate. 2. Substantially regressed left pleural effusion. No new cardiopulmonary abnormality. Electronically Signed: By: Odessa Fleming M.D. On: 08/21/2020 09:58   DG Chest 2 View  Result Date: 08/22/2020 CLINICAL DATA:  Pleural effusion Pneumothorax EXAM: CHEST - 2 VIEW COMPARISON:  08/21/2020 FINDINGS: Cardiomediastinal silhouette and pulmonary vasculature are within normal limits. Right apical pneumothorax is not significantly changed in size. Left pleural effusion is unchanged since prior exam. IMPRESSION: 1. Minimal right apical pneumothorax unchanged since prior exam. 2. Small to moderate left pleural effusion unchanged from prior exam. Electronically Signed  By: Mauri Reading  Mir M.D.   On: 08/22/2020 07:44   CT Angio Chest PE W and/or Wo Contrast  Result Date: 08/20/2020 CLINICAL DATA:  Recent appendectomy.  Chest  pain EXAM: CT ANGIOGRAPHY CHEST CT ABDOMEN AND PELVIS WITH CONTRAST TECHNIQUE: Multidetector CT imaging of the chest was performed using the standard protocol during bolus administration of intravenous contrast. Multiplanar CT image reconstructions and MIPs were obtained to evaluate the vascular anatomy. Multidetector CT imaging of the abdomen and pelvis was performed using the standard protocol during bolus administration of intravenous contrast. CONTRAST:  OMNIPAQUE IOHEXOL 350 MG/ML SOLN COMPARISON:  CT abdomen pelvis 06/10/2020 FINDINGS: CTA CHEST FINDINGS Cardiovascular: No filling defects within the pulmonary arteries to suggest acute pulmonary embolism. No acute findings aorta great vessels. No pericardial fluid. Mediastinum/Nodes: No axillary or supraclavicular adenopathy. No mediastinal or hilar adenopathy. No pericardial fluid. Esophagus normal. Lungs/Pleura: New large volume LEFT pleural effusion occupying approximately 60% of the LEFT hemithorax. The LEFT pleural fluid is intermediate density ranging size from HU from 20-25. There is complete passive atelectasis of the LEFT lower lobe. Atelectasis of the lingula additionally. Superior LEFT upper lobe is aerated. Additionally, there is a small to moderate volume RIGHT pneumothorax which collects anterior Lea within the RIGHT hemithorax in the supine position as well as over the RIGHT lung apex. Estimated volume approximately 15% of RIGHT hemithorax volume. Musculoskeletal: No rib fractures. Subcutaneous gas in the anterior chest wall along the costal margin. Review of the MIP images confirms the above findings. CT ABDOMEN and PELVIS FINDINGS Hepatobiliary: No focal hepatic lesion. No biliary duct dilatation. Common bile duct is normal. Pancreas: Pancreas is normal. No ductal dilatation. No pancreatic inflammation. Spleen: Low-density cysts within the spleen appears benign Adrenals/urinary tract: Adrenal glands and kidneys appear normal. Ureters and  bladder normal. Stomach/Bowel: Stomach small normal. No complication LEFT lower quadrant related to recent appendectomy. The ascending, transverse and descending colon normal. Vascular/Lymphatic: Abdominal aorta is normal caliber. No periportal or retroperitoneal adenopathy. No pelvic adenopathy. Reproductive: A post hysterectomy. Small amount free fluid the pelvis. Abscess formation in the pelvis. No intraperitoneal free air. Other: extensive subcutaneous gas along the anterior abdominal and chest wall extending from the suprapubic region to the costal margin. Subcutaneous gas seen well on coronal image 57/series 10 Musculoskeletal: No aggressive osseous lesion.  No traumatic injury. Review of the MIP images confirms the above findings. IMPRESSION: Chest Impression: 1. Large volume intermediate density LEFT pleural effusion occupying approximately 60% of the LEFT hemithorax volume. Complete passive atelectasis of the LEFT lower lobe and lingula. 2. Small to moderate volume RIGHT pneumothorax collects anteriorly within the RIGHT hemithorax. 3. Subcutaneous gas extends along the LEFT and RIGHT chest wall up to level of the breasts. 4. No evidence acute pulmonary embolism. Abdomen / Pelvis Impression: 1. Subcutaneous gas extends along the abdominal wall from the pubic symphysis to the costal margin on the LEFT and RIGHT. 2. No complicating features within the peritoneal space related to the appendectomy and hysterectomy. Small amount of free fluid the pelvis as expected. No organized fluid collections. No abscess. No intraperitoneal free air. Critical Value/emergent results were called by telephone at the time of interpretation on 08/20/2020 at 5:29 pm to provider Pifer, MD, who verbally acknowledged these results. Electronically Signed   By: Genevive Bi M.D.   On: 08/20/2020 17:32   CT ABDOMEN PELVIS W CONTRAST  Result Date: 08/20/2020 CLINICAL DATA:  Recent appendectomy.  Chest pain EXAM: CT ANGIOGRAPHY CHEST  CT ABDOMEN  AND PELVIS WITH CONTRAST TECHNIQUE: Multidetector CT imaging of the chest was performed using the standard protocol during bolus administration of intravenous contrast. Multiplanar CT image reconstructions and MIPs were obtained to evaluate the vascular anatomy. Multidetector CT imaging of the abdomen and pelvis was performed using the standard protocol during bolus administration of intravenous contrast. CONTRAST:  OMNIPAQUE IOHEXOL 350 MG/ML SOLN COMPARISON:  CT abdomen pelvis 06/10/2020 FINDINGS: CTA CHEST FINDINGS Cardiovascular: No filling defects within the pulmonary arteries to suggest acute pulmonary embolism. No acute findings aorta great vessels. No pericardial fluid. Mediastinum/Nodes: No axillary or supraclavicular adenopathy. No mediastinal or hilar adenopathy. No pericardial fluid. Esophagus normal. Lungs/Pleura: New large volume LEFT pleural effusion occupying approximately 60% of the LEFT hemithorax. The LEFT pleural fluid is intermediate density ranging size from HU from 20-25. There is complete passive atelectasis of the LEFT lower lobe. Atelectasis of the lingula additionally. Superior LEFT upper lobe is aerated. Additionally, there is a small to moderate volume RIGHT pneumothorax which collects anterior Lea within the RIGHT hemithorax in the supine position as well as over the RIGHT lung apex. Estimated volume approximately 15% of RIGHT hemithorax volume. Musculoskeletal: No rib fractures. Subcutaneous gas in the anterior chest wall along the costal margin. Review of the MIP images confirms the above findings. CT ABDOMEN and PELVIS FINDINGS Hepatobiliary: No focal hepatic lesion. No biliary duct dilatation. Common bile duct is normal. Pancreas: Pancreas is normal. No ductal dilatation. No pancreatic inflammation. Spleen: Low-density cysts within the spleen appears benign Adrenals/urinary tract: Adrenal glands and kidneys appear normal. Ureters and bladder normal. Stomach/Bowel:  Stomach small normal. No complication LEFT lower quadrant related to recent appendectomy. The ascending, transverse and descending colon normal. Vascular/Lymphatic: Abdominal aorta is normal caliber. No periportal or retroperitoneal adenopathy. No pelvic adenopathy. Reproductive: A post hysterectomy. Small amount free fluid the pelvis. Abscess formation in the pelvis. No intraperitoneal free air. Other: extensive subcutaneous gas along the anterior abdominal and chest wall extending from the suprapubic region to the costal margin. Subcutaneous gas seen well on coronal image 57/series 10 Musculoskeletal: No aggressive osseous lesion.  No traumatic injury. Review of the MIP images confirms the above findings. IMPRESSION: Chest Impression: 1. Large volume intermediate density LEFT pleural effusion occupying approximately 60% of the LEFT hemithorax volume. Complete passive atelectasis of the LEFT lower lobe and lingula. 2. Small to moderate volume RIGHT pneumothorax collects anteriorly within the RIGHT hemithorax. 3. Subcutaneous gas extends along the LEFT and RIGHT chest wall up to level of the breasts. 4. No evidence acute pulmonary embolism. Abdomen / Pelvis Impression: 1. Subcutaneous gas extends along the abdominal wall from the pubic symphysis to the costal margin on the LEFT and RIGHT. 2. No complicating features within the peritoneal space related to the appendectomy and hysterectomy. Small amount of free fluid the pelvis as expected. No organized fluid collections. No abscess. No intraperitoneal free air. Critical Value/emergent results were called by telephone at the time of interpretation on 08/20/2020 at 5:29 pm to provider Pifer, MD, who verbally acknowledged these results. Electronically Signed   By: Genevive Bi M.D.   On: 08/20/2020 17:32   DG CHEST PORT 1 VIEW  Result Date: 08/21/2020 CLINICAL DATA:  35 year old female with pneumothorax. Follow-up exam. EXAM: PORTABLE CHEST 1 VIEW COMPARISON:   Chest radiograph dated 08/21/2020. FINDINGS: Right apical pneumothorax measuring up to 2.3 cm to the apical pleural surface similar to prior radiograph. Small left pleural effusion and left lung base atelectasis or infiltrate, slightly increased since the  prior radiograph. The cardiac silhouette is within limits. No acute osseous pathology. IMPRESSION: Right apical pneumothorax similar to prior radiograph. Electronically Signed   By: Elgie CollardArash  Radparvar M.D.   On: 08/21/2020 18:25   US THORACENTESIS ASP PLEURAL SPACE W/IMG GUIDE  Result Date: 08/21/2020 INDICATION: Patient with a history of recent laparoscopic surgery for endometriosis, bowel resection and appendectomy presents today with left pleural effusion. Interventional radiology asked to perform a therapeutic and diagnostic thoracentesis. EXAM: ULTRASOUND GUIDED THORACENTESIS MEDICATIONS: 1% lidocaine 10 mL COMPLICATIONS: None immediate. PROCEDURE: An ultrasound guided thoracentesis was thoroughly discussed with the patient and questions answered. The benefits, risks, alternatives and complications were also discussed. The patient understands and wishes to proceed with the procedure. Written consent was obtained. Ultrasound was performed to localize and mark an adequate pocket of fluid in the left chest. The area was then prepped and draped in the normal sterile fashion. 1% Lidocaine was used for local anesthesia. Under ultrasound guidance a 6 Fr Safe-T-Centesis catheter was introduced. Thoracentesis was performed. The catheter was removed and a dressing applied. FINDINGS: A total of approximately 1.4 L of bloody fluid was removed. Samples were sent to the laboratory as requested by the clinical team. IMPRESSION: Successful ultrasound guided left thoracentesis yielding 1.4 L of pleural fluid. Read by: Alwyn RenJamie Covington, NP Electronically Signed   By: Simonne ComeJohn  Watts M.D.   On: 08/21/2020 10:30    Scheduled Meds: . docusate sodium  100 mg Oral BID  . ferrous  sulfate  325 mg Oral Q breakfast   Continuous Infusions: . ceFEPime (MAXIPIME) IV 2 g (08/22/20 0617)  . lactated ringers 125 mL/hr at 08/20/20 2211     LOS: 2 days   Time spent: 30 minutes   Hughie Clossavi Rod Majerus, MD [facility]  08/22/2020, 1:29 PM   To contact the attending provider between 7A-7P or the covering provider during after hours 7P-7A, please log into the web site [email or web address].

## 2020-08-23 ENCOUNTER — Inpatient Hospital Stay (HOSPITAL_COMMUNITY): Payer: No Typology Code available for payment source

## 2020-08-23 DIAGNOSIS — A419 Sepsis, unspecified organism: Secondary | ICD-10-CM | POA: Diagnosis present

## 2020-08-23 LAB — CBC WITH DIFFERENTIAL/PLATELET
Abs Immature Granulocytes: 0.14 10*3/uL — ABNORMAL HIGH (ref 0.00–0.07)
Basophils Absolute: 0.1 10*3/uL (ref 0.0–0.1)
Basophils Relative: 1 %
Eosinophils Absolute: 1.1 10*3/uL — ABNORMAL HIGH (ref 0.0–0.5)
Eosinophils Relative: 10 %
HCT: 30.4 % — ABNORMAL LOW (ref 36.0–46.0)
Hemoglobin: 9.9 g/dL — ABNORMAL LOW (ref 12.0–15.0)
Immature Granulocytes: 1 %
Lymphocytes Relative: 21 %
Lymphs Abs: 2.3 10*3/uL (ref 0.7–4.0)
MCH: 28.9 pg (ref 26.0–34.0)
MCHC: 32.6 g/dL (ref 30.0–36.0)
MCV: 88.9 fL (ref 80.0–100.0)
Monocytes Absolute: 0.7 10*3/uL (ref 0.1–1.0)
Monocytes Relative: 7 %
Neutro Abs: 6.5 10*3/uL (ref 1.7–7.7)
Neutrophils Relative %: 60 %
Platelets: 645 10*3/uL — ABNORMAL HIGH (ref 150–400)
RBC: 3.42 MIL/uL — ABNORMAL LOW (ref 3.87–5.11)
RDW: 13.8 % (ref 11.5–15.5)
WBC: 10.8 10*3/uL — ABNORMAL HIGH (ref 4.0–10.5)
nRBC: 0 % (ref 0.0–0.2)

## 2020-08-23 LAB — BASIC METABOLIC PANEL
Anion gap: 8 (ref 5–15)
BUN: 8 mg/dL (ref 6–20)
CO2: 25 mmol/L (ref 22–32)
Calcium: 8.7 mg/dL — ABNORMAL LOW (ref 8.9–10.3)
Chloride: 104 mmol/L (ref 98–111)
Creatinine, Ser: 0.54 mg/dL (ref 0.44–1.00)
GFR, Estimated: >60 mL/min (ref >60)
Glucose, Bld: 81 mg/dL (ref 70–99)
Potassium: 3.9 mmol/L (ref 3.5–5.1)
Sodium: 137 mmol/L (ref 135–145)

## 2020-08-23 IMAGING — DX DG CHEST 1V PORT
1 series · 1 of 1 positions shown · non-contrast
Comparison: [DATE]

CLINICAL DATA: Shortness of breath.  Pneumothorax

EXAM:
PORTABLE CHEST 1 VIEW

[chest ap]
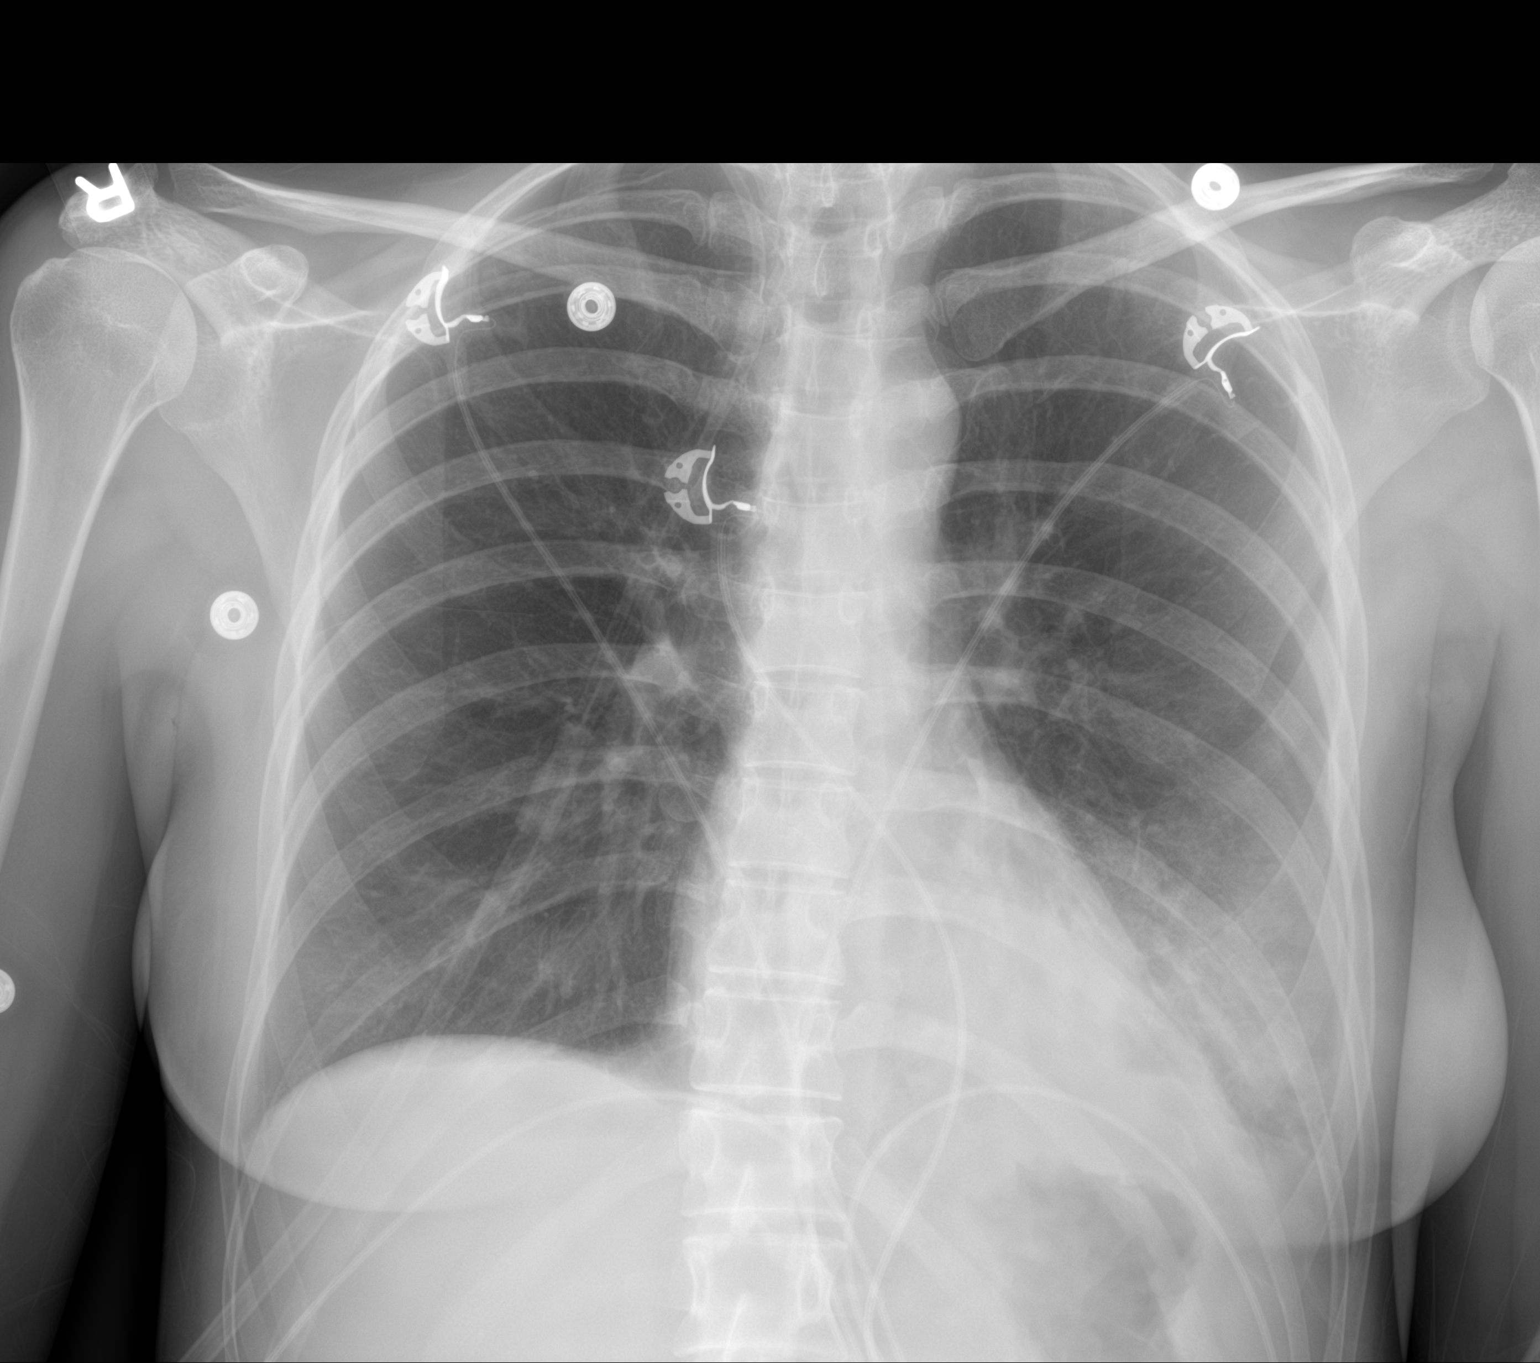

[1 of 1 positions shown; findings below may reference images not displayed]

FINDINGS: Small right apical pneumothorax is stable. No tension component.
There is airspace opacity in the left base with equivocal left
pleural effusion. Lungs elsewhere clear. Heart size and pulmonary
vascularity are normal. No adenopathy. No bone lesions.
IMPRESSION: 1. Small right apical pneumothorax without tension component,
stable.

2. Airspace opacity consistent with atelectasis and pneumonia left
base with equivocal left pleural effusion.

3.  Heart size normal.

## 2020-08-23 MED ORDER — CEFDINIR 300 MG PO CAPS: 300.0000 mg | ORAL_CAPSULE | Freq: Two times a day (BID) | ORAL | 0 refills | Status: AC

## 2020-08-23 MED ORDER — ONDANSETRON 4 MG PO TBDP: 4.0000 mg | ORAL_TABLET | Freq: Three times a day (TID) | ORAL | 0 refills | Status: DC | PRN

## 2020-08-23 NOTE — Discharge Instructions (Signed)
Pneumothorax A pneumothorax is commonly called a collapsed lung. It is a condition in which air leaks from a lung and builds up between the thin layer of tissue that covers the lungs (visceral pleura) and the interior wall of the chest cavity (parietal pleura). The air gets trapped outside the lung, between the lung and the chest wall (pleural space). The air takes up space and prevents the lung from fully expanding. This condition sometimes occurs suddenly with no apparent cause. The buildup of air may be small or large. A small pneumothorax may go away on its own. A large pneumothorax will require treatment and hospitalization. What are the causes? This condition may be caused by:  Trauma and injury to the chest wall.  Surgery and other medical procedures.  A complication of an underlying lung problem, especially chronic obstructive pulmonary disease (COPD) or emphysema. Sometimes the cause of this condition is not known. What increases the risk? You are more likely to develop this condition if:  You have an underlying lung problem.  You smoke.  You are 20-40 years old, female, tall, and underweight.  You have a personal or family history of pneumothorax.  You have an eating disorder (anorexia nervosa). This condition can also happen quickly, even in people with no history of lung problems. What are the signs or symptoms? Sometimes a pneumothorax will have no symptoms. When symptoms are present, they can include:  Chest pain.  Shortness of breath.  Increased rate of breathing.  Bluish color to your lips or skin (cyanosis). How is this diagnosed? This condition may be diagnosed by:  A medical history and physical exam.  A chest X-ray, chest CT scan, or ultrasound. How is this treated? Treatment depends on how severe your condition is. The goal of treatment is to remove the extra air and allow your lung to expand back to its normal size.  For a small pneumothorax: ? No  treatment may be needed. ? Extra oxygen is sometimes used to make it go away more quickly.  For a large pneumothorax or one that is causing symptoms, a procedure is done to release the air from around your lungs. To do this, a health care provider may use: ? A needle with a syringe. This is used to suck air from a pleural space where no additional leakage is taking place. ? A chest tube. This is used to suck air where there is ongoing leakage into the pleural space. The chest tube may need to remain in place for several days until the air leak has healed.  In more severe cases, surgery may be needed to repair the damage that is causing the leak.  If you have multiple pneumothorax episodes or have an air leak that will not heal, a procedure called a pleurodesis may be done. A medicine is placed in the pleural space to irritate the tissues around the lung so that the lung will stick to the chest wall, seal any leaks, and stop any buildup of air in that space. If you have an underlying lung problem, severe symptoms, or a large pneumothorax you will usually need to stay in the hospital.   Follow these instructions at home: Lifestyle  Do not use any products that contain nicotine or tobacco, such as cigarettes and e-cigarettes. These are major risk factors in pneumothorax. If you need help quitting, ask your health care provider.  Do not lift anything that is heavier than 10 lb (4.5 kg), or the limit that your   health care provider tells you, until he or she says that it is safe.  Avoid activities that take a lot of effort (strenuous) for as long as told by your health care provider.  Return to your normal activities as told by your health care provider. Ask your health care provider what activities are safe for you.  Do not fly in an airplane or scuba dive until your health care provider says it is okay. General instructions  Take over-the-counter and prescription medicines only as told by your  health care provider.  If a cough or pain makes it difficult for you to sleep at night, try sleeping in a semi-upright position in a recliner or by using 2 or 3 pillows.  If you had a chest tube and it was removed, ask your health care provider when you can remove the bandage (dressing). While the dressing is in place, do not allow it to get wet.  Keep all follow-up visits as told by your health care provider. This is important. Contact a health care provider if:  You cough up thick mucus (sputum) that is yellow or green in color.  You were treated with a chest tube, and you have redness, increasing pain, or discharge at the site where it was placed. Get help right away if:  You have increasing chest pain or shortness of breath.  You have a cough that will not go away.  You begin coughing up blood.  You have pain that is getting worse or is not controlled with medicines.  The site where your chest tube was located opens up.  You feel air coming out of the site where the chest tube was placed.  You have a fever or persistent symptoms for more than 2-3 days. These symptoms may represent a serious problem that is an emergency. Do not wait to see if the symptoms will go away. Get medical help right away. Call your local emergency services (911 in the [place] Do not drive yourself to the hospital. Summary  A pneumothorax, commonly called a collapsed lung, is a condition in which air leaks from a lung and gets trapped between the lung and the chest wall (pleural space).  The buildup of air may be small or large. A small pneumothorax may go away on its own. A large pneumothorax will require treatment and hospitalization.  Treatment for this condition depends on how severe the pneumothorax is. The goal of treatment is to remove the extra air and allow the lung to expand back to its normal size. This information is not intended to replace advice given to you by your health care provider.  Make sure you discuss any questions you have with your health care provider. Document Revised: 01/07/2020 Document Reviewed: 01/07/2020 [employer] Patient Education  2021 [employer]  

## 2020-08-23 NOTE — Progress Notes (Signed)
Patient given discharge instructions medication list and follow up appointments. Patient prescriptions sent to personal pharmacy. IV and tele were dcd. Will discharge home as ordered. Transported to exit via wheelchair and hospital staff. All questions answered at this time.  Rayssa Atha, Randall An RN

## 2020-08-23 NOTE — Discharge Summary (Addendum)
Physician Discharge Summary  BROOKLYN ALFREDO FXT:[id number] DOB: [birth year] DOA: 08/20/2020  PCP: Donald Prose, MD  Admit date: 08/20/2020 Discharge date: 08/23/2020 30 Day Unplanned Readmission Risk Score   Flowsheet Row ED to Hosp-Admission (Current) from 08/20/2020 in [facility] 4E CV SURGICAL PROGRESSIVE CARE  30 Day Unplanned Readmission Risk Score (%) 12.41 Filed at 08/23/2020 0801     This score is the patient's risk of an unplanned readmission within 30 days of being discharged (0 -100%). The score is based on dignosis, age, lab data, medications, orders, and past utilization.   Low:  0-14.9   Medium: 15-21.9   High: 22-29.9   Extreme: 30 and above         Admitted From: Home Disposition: Home  Recommendations for Outpatient Follow-up:  1. Follow up with PCP in 1-2 weeks 2. Follow-up with pulmonology on Friday for follow-up chest x-ray for pleural effusion and pneumothorax 3. Please obtain BMP/CBC in one week 4. Please follow up with your PCP on the following pending results: Unresulted Labs (From admission, onward)          Start     Ordered   08/22/20 0500  CBC with Differential/Platelet  Daily,   R     Question:  Specimen collection method  Answer:  Lab=Lab collect   08/21/20 1020   08/22/20 2992  Basic metabolic panel  Daily,   R     Question:  Specimen collection method  Answer:  Lab=Lab collect   08/21/20 1020   08/21/20 0915  Cholesterol, body fluid  RELEASE UPON ORDERING,   TIMED        08/21/20 0915   08/20/20 2204  Albumin, pleural or peritoneal fluid  (Thoracentesis Labs Panel)  Once,   STAT        08/20/20 2204   08/20/20 2204  Amylase, pleural or peritoneal fluid  (Thoracentesis Labs Panel)  Once,   STAT        08/20/20 2204   08/20/20 2204  Body fluid cell count with differential  (Thoracentesis Labs Panel)  Once,   STAT       Question Answer Comment  Are there also cytology or pathology orders on this specimen? Yes   Release to patient Immediate      08/20/20  2204   08/20/20 2204  Cholesterol, body fluid  (Thoracentesis Labs Panel)  Once,   STAT        08/20/20 2204   08/20/20 2204  Glucose, pleural or peritoneal fluid  (Thoracentesis Labs Panel)  Once,   STAT        08/20/20 2204   08/20/20 2204  Lactate dehydrogenase (pleural or peritoneal fluid)  (Thoracentesis Labs Panel)  Once,   STAT        08/20/20 2204   08/20/20 2204  PH, Body Fluid  (Thoracentesis Labs Panel)  Once,   STAT       Comments: Send specimen on ice.    08/20/20 2204   08/20/20 2204  Protein, pleural or peritoneal fluid  (Thoracentesis Labs Panel)  Once,   STAT        08/20/20 2204   08/20/20 2204  Triglycerides, Body Fluid  (Thoracentesis Labs Panel)  Once,   STAT        08/20/20 2204   08/20/20 2204  Body fluid culture (includes gram stain)  (Thoracentesis Labs Panel)  Once,   STAT       Question Answer Comment  Are there also cytology or pathology orders  on this specimen? Yes   Patient immune status Normal      08/20/20 2204            Home Health: None Equipment/Devices: None  Discharge Condition: Stable CODE STATUS: Full code Diet recommendation: Cardiac  Subjective: Seen and examined.  Feels well.  No shortness of breath.  Wants to go home.  Brief/Interim Summary: Dayelin Balducci is a 35 year old female with a history of endometriosis, anxiety and iron deficiency who underwent laparoscopic surgery in [place] about 10 days ago with removal of significant endometriosis and included a bowel resection and appendectomy presented to ED On 08/20/2020 with a complaint of chest pain.  She was diagnosed with large left-sided pleural effusion and mild to moderate right-sided pneumothorax.  Case was discussed with CT surgery by ED physician who had recommended thoracentesis.  Patient was admitted to hospital service.  She underwent thoracentesis on 08/21/2020 with removal of bloody 1.4 L of fluid.  Reportedly, thoracentesis had to be stopped due to patient's coughing. Post  thoracentesis x-ray showed slightly worsened right-sided pneumothorax.  No left-sided pneumothorax.  PCCM was consulted.  Patient was seen by CT surgery as well.    Both CT surgery as well as PCCM opined that there is no need of chest tube since her pneumothorax was small and stable and she was not hypoxic.  Serial x-rays were done for follow-up which showed that her pneumothorax was a stable.  Pleural fluid analysis indicated exudative pleural effusion.  She was a started on cefepime and vancomycin from the admission.  Vancomycin was discontinued.  However she was continued on cefepime.  Gram stain for pleural fluid showed mononuclear rare gram-positive cocci in pairs.  Final identification is not available yet.  Cytology shows atypical cells, likely reactive in nature.  Her leukocytosis improved.  Patient also met sepsis criteria at the time of admission based on leukocytosis, tachycardia.  She does seem to have some pneumonia on the left side.  She has been cleared by [facility] for discharge.  It appears that she already has appointment to see PCCM in 3 days for follow-up of chest x-ray.  I will discharge her on 5 more days of oral cefdinir to complete the course of antibiotics.  Of note, she requested to prescribe her Zofran just in case she becomes nauseous as she wants to continue to take her medications properly.  Discharge Diagnoses:  Active Problems:   Iron deficiency anemia   Pneumothorax   Pleural effusion   SIRS (systemic inflammatory response syndrome) (HCC)   Sepsis ([place])    Discharge Instructions   Allergies as of 08/23/2020   No Known Allergies     Medication List    TAKE these medications   acetaminophen 500 MG tablet Commonly known as: TYLENOL Take 1,000 mg by mouth every 6 (six) hours as needed for moderate pain or headache.   cefdinir 300 MG capsule Commonly known as: OMNICEF Take 1 capsule (300 mg total) by mouth 2 (two) times daily for 5 days.   cholecalciferol 25 MCG  (1000 UNIT) tablet Commonly known as: VITAMIN D3 Take 1,000 Units by mouth daily. Notes to patient: Take as you were prior to admission    clonazePAM 0.5 MG tablet Commonly known as: KLONOPIN Take 1 tablet by mouth daily as needed for anxiety.   dicyclomine 20 MG tablet Commonly known as: BENTYL Take 1 tablet (20 mg total) by mouth 3 (three) times daily as needed for spasms.   docusate sodium 100  MG capsule Commonly known as: COLACE Take 100 mg by mouth daily.   fluticasone 50 MCG/ACT nasal spray Commonly known as: FLONASE Place 1 spray into both nostrils daily as needed for allergies.   GAS-X PO Take 1 tablet by mouth daily as needed (gas/bloating).   IRON PO Take 1 each by mouth daily.   ondansetron 4 MG disintegrating tablet Commonly known as: Zofran ODT Take 1 tablet (4 mg total) by mouth every 8 (eight) hours as needed for nausea or vomiting.   vitamin B-12 1000 MCG tablet Commonly known as: CYANOCOBALAMIN Take 1,000 mcg by mouth daily.   Vitamin D (Ergocalciferol) 1.25 MG (50000 UNIT) Caps capsule Commonly known as: DRISDOL Take 50,000 Units by mouth. Notes to patient: Take as you were prior to admission    Zofran 4 MG tablet Generic drug: ondansetron Take 4 mg by mouth every 6 (six) hours as needed for nausea or vomiting.       Follow-up Information    Donald Prose, MD Follow up in 1 week(s).   Specialty: Family Medicine Contact information: [address] [place] [address] [phone]              No Known Allergies  Consultations: CT surgery and PCCM   Procedures/Studies: DG Chest 1 View  Addendum Date: 08/21/2020   ADDENDUM REPORT: 08/21/2020 10:32 ADDENDUM: Study discussed by telephone with Dr. Doristine Bosworth, hospitalist service on 08/21/2020 at 1028 hours. We discussed placing the patient on oxygen with repeat x-ray this afternoon to re-evaluate the right side pneumothorax. Electronically Signed   By: Genevie Ann M.D.   On:  08/21/2020 10:32   Result Date: 08/21/2020 CLINICAL DATA:  35 year old female status post ultrasound-guided left side thoracentesis this morning. EXAM: CHEST  1 VIEW COMPARISON:  CTA chest yesterday. FINDINGS: Standing PA view of the chest at 0908 hours. Substantially regressed left side pleural effusion with no pneumothorax identified. Mediastinal contours remain normal. Contralateral right side pneumothorax is increased since yesterday, now small to moderate (pleural edge at the right posterior 4th rib level now. Right lung otherwise clear. No osseous abnormality identified. Negative visible bowel gas pattern. IMPRESSION: 1. No left-side pneumothorax following thoracentesis but the right side pneumothorax appears larger since the CTA yesterday, small to moderate. 2. Substantially regressed left pleural effusion. No new cardiopulmonary abnormality. Electronically Signed: By: Genevie Ann M.D. On: 08/21/2020 09:58   DG Chest 2 View  Result Date: 08/22/2020 CLINICAL DATA:  Pleural effusion Pneumothorax EXAM: CHEST - 2 VIEW COMPARISON:  08/21/2020 FINDINGS: Cardiomediastinal silhouette and pulmonary vasculature are within normal limits. Right apical pneumothorax is not significantly changed in size. Left pleural effusion is unchanged since prior exam. IMPRESSION: 1. Minimal right apical pneumothorax unchanged since prior exam. 2. Small to moderate left pleural effusion unchanged from prior exam. Electronically Signed   By: Miachel Roux M.D.   On: 08/22/2020 07:44   CT Angio Chest PE W and/or Wo Contrast  Result Date: 08/20/2020 CLINICAL DATA:  Recent appendectomy.  Chest pain EXAM: CT ANGIOGRAPHY CHEST CT ABDOMEN AND PELVIS WITH CONTRAST TECHNIQUE: Multidetector CT imaging of the chest was performed using the standard protocol during bolus administration of intravenous contrast. Multiplanar CT image reconstructions and MIPs were obtained to evaluate the vascular anatomy. Multidetector CT imaging of the abdomen and  pelvis was performed using the standard protocol during bolus administration of intravenous contrast. CONTRAST:  194m OMNIPAQUE IOHEXOL 350 MG/ML SOLN COMPARISON:  CT abdomen pelvis 06/10/2020 FINDINGS: CTA CHEST FINDINGS Cardiovascular:  No filling defects within the pulmonary arteries to suggest acute pulmonary embolism. No acute findings aorta great vessels. No pericardial fluid. Mediastinum/Nodes: No axillary or supraclavicular adenopathy. No mediastinal or hilar adenopathy. No pericardial fluid. Esophagus normal. Lungs/Pleura: New large volume LEFT pleural effusion occupying approximately 60% of the LEFT hemithorax. The LEFT pleural fluid is intermediate density ranging size from HU from 20-25. There is complete passive atelectasis of the LEFT lower lobe. Atelectasis of the lingula additionally. Superior LEFT upper lobe is aerated. Additionally, there is a small to moderate volume RIGHT pneumothorax which collects anterior Lea within the RIGHT hemithorax in the supine position as well as over the RIGHT lung apex. Estimated volume approximately 15% of RIGHT hemithorax volume. Musculoskeletal: No rib fractures. Subcutaneous gas in the anterior chest wall along the costal margin. Review of the MIP images confirms the above findings. CT ABDOMEN and PELVIS FINDINGS Hepatobiliary: No focal hepatic lesion. No biliary duct dilatation. Common bile duct is normal. Pancreas: Pancreas is normal. No ductal dilatation. No pancreatic inflammation. Spleen: Low-density cysts within the spleen appears benign Adrenals/urinary tract: Adrenal glands and kidneys appear normal. Ureters and bladder normal. Stomach/Bowel: Stomach small normal. No complication LEFT lower quadrant related to recent appendectomy. The ascending, transverse and descending colon normal. Vascular/Lymphatic: Abdominal aorta is normal caliber. No periportal or retroperitoneal adenopathy. No pelvic adenopathy. Reproductive: A post hysterectomy. Small amount free  fluid the pelvis. Abscess formation in the pelvis. No intraperitoneal free air. Other: extensive subcutaneous gas along the anterior abdominal and chest wall extending from the suprapubic region to the costal margin. Subcutaneous gas seen well on coronal image 57/series 10 Musculoskeletal: No aggressive osseous lesion.  No traumatic injury. Review of the MIP images confirms the above findings. IMPRESSION: Chest Impression: 1. Large volume intermediate density LEFT pleural effusion occupying approximately 60% of the LEFT hemithorax volume. Complete passive atelectasis of the LEFT lower lobe and lingula. 2. Small to moderate volume RIGHT pneumothorax collects anteriorly within the RIGHT hemithorax. 3. Subcutaneous gas extends along the LEFT and RIGHT chest wall up to level of the breasts. 4. No evidence acute pulmonary embolism. Abdomen / Pelvis Impression: 1. Subcutaneous gas extends along the abdominal wall from the pubic symphysis to the costal margin on the LEFT and RIGHT. 2. No complicating features within the peritoneal space related to the appendectomy and hysterectomy. Small amount of free fluid the pelvis as expected. No organized fluid collections. No abscess. No intraperitoneal free air. Critical Value/emergent results were called by telephone at the time of interpretation on 08/20/2020 at 5:29 pm to provider Pifer, MD, who verbally acknowledged these results. Electronically Signed   By: Suzy Bouchard M.D.   On: 08/20/2020 17:32   CT ABDOMEN PELVIS W CONTRAST  Result Date: 08/20/2020 CLINICAL DATA:  Recent appendectomy.  Chest pain EXAM: CT ANGIOGRAPHY CHEST CT ABDOMEN AND PELVIS WITH CONTRAST TECHNIQUE: Multidetector CT imaging of the chest was performed using the standard protocol during bolus administration of intravenous contrast. Multiplanar CT image reconstructions and MIPs were obtained to evaluate the vascular anatomy. Multidetector CT imaging of the abdomen and pelvis was performed using the  standard protocol during bolus administration of intravenous contrast. CONTRAST:  159m OMNIPAQUE IOHEXOL 350 MG/ML SOLN COMPARISON:  CT abdomen pelvis 06/10/2020 FINDINGS: CTA CHEST FINDINGS Cardiovascular: No filling defects within the pulmonary arteries to suggest acute pulmonary embolism. No acute findings aorta great vessels. No pericardial fluid. Mediastinum/Nodes: No axillary or supraclavicular adenopathy. No mediastinal or hilar adenopathy. No pericardial fluid. Esophagus normal. Lungs/Pleura: New  large volume LEFT pleural effusion occupying approximately 60% of the LEFT hemithorax. The LEFT pleural fluid is intermediate density ranging size from HU from 20-25. There is complete passive atelectasis of the LEFT lower lobe. Atelectasis of the lingula additionally. Superior LEFT upper lobe is aerated. Additionally, there is a small to moderate volume RIGHT pneumothorax which collects anterior Lea within the RIGHT hemithorax in the supine position as well as over the RIGHT lung apex. Estimated volume approximately 15% of RIGHT hemithorax volume. Musculoskeletal: No rib fractures. Subcutaneous gas in the anterior chest wall along the costal margin. Review of the MIP images confirms the above findings. CT ABDOMEN and PELVIS FINDINGS Hepatobiliary: No focal hepatic lesion. No biliary duct dilatation. Common bile duct is normal. Pancreas: Pancreas is normal. No ductal dilatation. No pancreatic inflammation. Spleen: Low-density cysts within the spleen appears benign Adrenals/urinary tract: Adrenal glands and kidneys appear normal. Ureters and bladder normal. Stomach/Bowel: Stomach small normal. No complication LEFT lower quadrant related to recent appendectomy. The ascending, transverse and descending colon normal. Vascular/Lymphatic: Abdominal aorta is normal caliber. No periportal or retroperitoneal adenopathy. No pelvic adenopathy. Reproductive: A post hysterectomy. Small amount free fluid the pelvis. Abscess  formation in the pelvis. No intraperitoneal free air. Other: extensive subcutaneous gas along the anterior abdominal and chest wall extending from the suprapubic region to the costal margin. Subcutaneous gas seen well on coronal image 57/series 10 Musculoskeletal: No aggressive osseous lesion.  No traumatic injury. Review of the MIP images confirms the above findings. IMPRESSION: Chest Impression: 1. Large volume intermediate density LEFT pleural effusion occupying approximately 60% of the LEFT hemithorax volume. Complete passive atelectasis of the LEFT lower lobe and lingula. 2. Small to moderate volume RIGHT pneumothorax collects anteriorly within the RIGHT hemithorax. 3. Subcutaneous gas extends along the LEFT and RIGHT chest wall up to level of the breasts. 4. No evidence acute pulmonary embolism. Abdomen / Pelvis Impression: 1. Subcutaneous gas extends along the abdominal wall from the pubic symphysis to the costal margin on the LEFT and RIGHT. 2. No complicating features within the peritoneal space related to the appendectomy and hysterectomy. Small amount of free fluid the pelvis as expected. No organized fluid collections. No abscess. No intraperitoneal free air. Critical Value/emergent results were called by telephone at the time of interpretation on 08/20/2020 at 5:29 pm to provider Pifer, MD, who verbally acknowledged these results. Electronically Signed   By: Suzy Bouchard M.D.   On: 08/20/2020 17:32   DG CHEST PORT 1 VIEW  Result Date: 08/23/2020 CLINICAL DATA:  Shortness of breath.  Pneumothorax EXAM: PORTABLE CHEST 1 VIEW COMPARISON:  August 22, 2020 FINDINGS: Small right apical pneumothorax is stable. No tension component. There is airspace opacity in the left base with equivocal left pleural effusion. Lungs elsewhere clear. Heart size and pulmonary vascularity are normal. No adenopathy. No bone lesions. IMPRESSION: 1. Small right apical pneumothorax without tension component, stable. 2. Airspace  opacity consistent with atelectasis and pneumonia left base with equivocal left pleural effusion. 3.  Heart size normal. Electronically Signed   By: Lowella Grip III M.D.   On: 08/23/2020 08:21   DG CHEST PORT 1 VIEW  Result Date: 08/22/2020 CLINICAL DATA:  35 year old female with pneumothorax. EXAM: PORTABLE CHEST 1 VIEW COMPARISON:  Chest radiograph dated 08/22/2020. FINDINGS: No significant interval change in the right apical pneumothorax since the earlier radiograph. Small left pleural effusion and left lung base atelectasis. Stable cardiomediastinal silhouette. No acute osseous pathology. IMPRESSION: No significant interval change in the  right apical pneumothorax since the earlier radiograph. Electronically Signed   By: Anner Crete M.D.   On: 08/22/2020 17:20   DG CHEST PORT 1 VIEW  Result Date: 08/21/2020 CLINICAL DATA:  35 year old female with pneumothorax. Follow-up exam. EXAM: PORTABLE CHEST 1 VIEW COMPARISON:  Chest radiograph dated 08/21/2020. FINDINGS: Right apical pneumothorax measuring up to 2.3 cm to the apical pleural surface similar to prior radiograph. Small left pleural effusion and left lung base atelectasis or infiltrate, slightly increased since the prior radiograph. The cardiac silhouette is within limits. No acute osseous pathology. IMPRESSION: Right apical pneumothorax similar to prior radiograph. Electronically Signed   By: Anner Crete M.D.   On: 08/21/2020 18:25   US THORACENTESIS ASP PLEURAL SPACE W/IMG GUIDE  Result Date: 08/21/2020 INDICATION: Patient with a history of recent laparoscopic surgery for endometriosis, bowel resection and appendectomy presents today with left pleural effusion. Interventional radiology asked to perform a therapeutic and diagnostic thoracentesis. EXAM: ULTRASOUND GUIDED THORACENTESIS MEDICATIONS: 1% lidocaine 10 mL COMPLICATIONS: None immediate. PROCEDURE: An ultrasound guided thoracentesis was thoroughly discussed with the patient and  questions answered. The benefits, risks, alternatives and complications were also discussed. The patient understands and wishes to proceed with the procedure. Written consent was obtained. Ultrasound was performed to localize and mark an adequate pocket of fluid in the left chest. The area was then prepped and draped in the normal sterile fashion. 1% Lidocaine was used for local anesthesia. Under ultrasound guidance a 6 Fr Safe-T-Centesis catheter was introduced. Thoracentesis was performed. The catheter was removed and a dressing applied. FINDINGS: A total of approximately 1.4 L of bloody fluid was removed. Samples were sent to the laboratory as requested by the clinical team. IMPRESSION: Successful ultrasound guided left thoracentesis yielding 1.4 L of pleural fluid. Read by: Soyla Dryer, NP Electronically Signed   By: Sandi Mariscal M.D.   On: 08/21/2020 10:30     Discharge Exam: Vitals:   08/23/20 0812 08/23/20 1110  BP:    Pulse:    Resp:    Temp:    SpO2: 95% 97%   Vitals:   08/23/20 0415 08/23/20 0808 08/23/20 0812 08/23/20 1110  BP: 99/66 99/71    Pulse: 97 85    Resp: 14 20    Temp: 98.3 F (36.8 C) 98.1 F (36.7 C)    TempSrc: Oral Oral    SpO2: 100% 96% 95% 97%  Weight:      Height:        General: Pt is alert, awake, not in acute distress Cardiovascular: RRR, S1/S2 +, no rubs, no gallops Respiratory: CTA bilaterally, no wheezing, no rhonchi Abdominal: Soft, NT, ND, bowel sounds + Extremities: no edema, no cyanosis    The results of significant diagnostics from this hospitalization (including imaging, microbiology, ancillary and laboratory) are listed below for reference.     Microbiology: Recent Results (from the past 240 hour(s))  Culture, blood (routine x 2)     Status: None (Preliminary result)   Collection Time: 08/20/20  5:55 PM   Specimen: BLOOD  Result Value Ref Range Status   Specimen Description   Final    BLOOD RIGHT ANTECUBITAL Performed at [facility]    Special Requests   Final    BOTTLES DRAWN AEROBIC AND ANAEROBIC Blood Culture adequate volume Performed at [facility]    Culture   Final    NO GROWTH 3 DAYS Performed at [facility], [address] [place],  [place] [address]    Report Status PENDING  Incomplete  Culture, blood (routine x 2)     Status: None (Preliminary result)   Collection Time: 08/20/20  6:16 PM   Specimen: BLOOD  Result Value Ref Range Status   Specimen Description   Final    BLOOD LEFT WRIST Performed at [facility]    Special Requests   Final    BOTTLES DRAWN AEROBIC ONLY Blood Culture adequate volume Performed at [facility]    Culture   Final    NO GROWTH 3 DAYS Performed at [facility], [place] [address] [place], [place] [address]    Report Status PENDING  Incomplete  Resp Panel by RT-PCR (Flu A&B, Covid) Nasopharyngeal Swab     Status: None   Collection Time: 08/20/20  8:12 PM   Specimen: Nasopharyngeal Swab; Nasopharyngeal(NP) swabs in vial transport medium  Result Value Ref Range Status   SARS Coronavirus 2 by RT PCR NEGATIVE NEGATIVE Final    Comment: (NOTE) SARS-CoV-2 target nucleic acids are NOT DETECTED.  The SARS-CoV-2 RNA is generally detectable in upper respiratory specimens during the acute phase of infection. The lowest concentration of SARS-CoV-2 viral copies this assay can detect is 138 copies/mL. A negative result does not preclude SARS-Cov-2 infection and should not be used as the sole basis for treatment or other patient management decisions. A negative result may occur with  improper specimen collection/handling, submission of specimen other than nasopharyngeal swab, presence of viral mutation(s) within the areas targeted by this assay, and inadequate number of viral copies(<138 copies/mL). A negative result must be combined with clinical observations, patient history, and  epidemiological information. The expected result is Negative.  Fact Sheet for Patients:  EntrepreneurPulse.com.au  Fact Sheet for Healthcare Providers:  IncredibleEmployment.be  This test is no t yet approved or cleared by the [place] FDA and  has been authorized for detection and/or diagnosis of SARS-CoV-2 by FDA under an Emergency Use Authorization (EUA). This EUA will remain  in effect (meaning this test can be used) for the duration of the COVID-19 declaration under Section 564(b)(1) of the Act, 21 U.S.C.section 360bbb-3(b)(1), unless the authorization is terminated  or revoked sooner.       Influenza A by PCR NEGATIVE NEGATIVE Final   Influenza B by PCR NEGATIVE NEGATIVE Final    Comment: (NOTE) The Xpert Xpress SARS-CoV-2/FLU/RSV plus assay is intended as an aid in the diagnosis of influenza from Nasopharyngeal swab specimens and should not be used as a sole basis for treatment. Nasal washings and aspirates are unacceptable for Xpert Xpress SARS-CoV-2/FLU/RSV testing.  Fact Sheet for Patients: EntrepreneurPulse.com.au  Fact Sheet for Healthcare Providers: IncredibleEmployment.be  This test is not yet approved or cleared by the [place] FDA and has been authorized for detection and/or diagnosis of SARS-CoV-2 by FDA under an Emergency Use Authorization (EUA). This EUA will remain in effect (meaning this test can be used) for the duration of the COVID-19 declaration under Section 564(b)(1) of the Act, 21 U.S.C. section 360bbb-3(b)(1), unless the authorization is terminated or revoked.  Performed at [facility]   Surgical pcr screen     Status: None   Collection Time: 08/21/20  3:13 AM   Specimen: Nasal Mucosa; Nasal Swab  Result Value Ref Range Status   MRSA, PCR NEGATIVE NEGATIVE Final   Staphylococcus aureus NEGATIVE NEGATIVE Final    Comment: (NOTE) The Xpert SA  Assay (FDA approved for NASAL specimens  in patients 74 years of age and older), is one component of a comprehensive surveillance program. It is not intended to diagnose infection nor to guide or monitor treatment. Performed at [facility], [place] [address], [place], [place] [address]   Body fluid culture w Gram Stain     Status: None (Preliminary result)   Collection Time: 08/21/20  9:13 AM   Specimen: PATH Cytology Pleural fluid  Result Value Ref Range Status   Specimen Description PLEURAL FLUID  Final   Special Requests NONE  Final   Gram Stain   Final    RARE WBC PRESENT, PREDOMINANTLY MONONUCLEAR RARE GRAM POSITIVE COCCI IN PAIRS FEW GRAM VARIABLE ROD    Culture   Final    NO GROWTH 2 DAYS Performed at [facility], [place] [address] [place], [place] [address]    Report Status PENDING  Incomplete     Labs: BNP (last 3 results) No results for input(s): BNP in the last 8760 hours. Basic Metabolic Panel: Recent Labs  Lab 08/20/20 1502 08/21/20 0200 08/22/20 0423 08/23/20 0148  NA 136 135 137 137  K 4.0 3.8 4.1 3.9  CL 101 104 105 104  CO2 _0 GLUCOSE 93 133* 84 81  BUN 8 <5* 7 8  CREATININE 0.49 0.54 0.60 0.54  CALCIUM 8.9 8.5* 8.6* 8.7*  MG  --  2.1  --   --   PHOS  --  2.7  --   --    Liver Function Tests: Recent Labs  Lab 08/20/20 1502 08/21/20 0200  AST 59* 37  ALT 54* 46*  ALKPHOS 63 59  BILITOT 1.0 1.2  PROT 7.0 5.9*  ALBUMIN 3.8 2.8*   Recent Labs  Lab 08/20/20 1502  LIPASE <10*   No results for input(s): AMMONIA in the last 168 hours. CBC: Recent Labs  Lab 08/20/20 1502 08/21/20 0200 08/22/20 0423 08/23/20 0148  WBC 13.1* 13.3* 11.2* 10.8*  NEUTROABS 9.0* 9.1* 6.4 6.5  HGB 10.8* 9.9* 9.3* 9.9*  HCT 33.0* 30.0* 29.3* 30.4*  MCV 86.4 87.0 91.0 88.9  PLT 440* 547* 524* 645*   Cardiac Enzymes: Recent Labs  Lab 08/20/20 2301  CKTOTAL 22*   BNP: Invalid input(s): POCBNP CBG: No results for input(s): GLUCAP in  the last 168 hours. D-Dimer No results for input(s): DDIMER in the last 72 hours. Hgb A1c No results for input(s): HGBA1C in the last 72 hours. Lipid Profile No results for input(s): CHOL, HDL, LDLCALC, TRIG, CHOLHDL, LDLDIRECT in the last 72 hours. Thyroid function studies Recent Labs    08/21/20 0200  TSH 2.513   Anemia work up Recent Labs    08/20/20 2301  VITAMINB12 691  FOLATE 17.9  FERRITIN 288  TIBC 231*  IRON 17*  RETICCTPCT 2.3   Urinalysis    Component Value Date/Time   COLORURINE COLORLESS (A) 08/20/2020 1502   APPEARANCEUR CLEAR 08/20/2020 1502   LABSPEC 1.003 (L) 08/20/2020 1502   PHURINE 5.5 08/20/2020 1502   GLUCOSEU NEGATIVE 08/20/2020 1502   HGBUR SMALL (A) 08/20/2020 1502   BILIRUBINUR NEGATIVE 08/20/2020 1502   KETONESUR 15 (A) 08/20/2020 1502   PROTEINUR NEGATIVE 08/20/2020 1502   NITRITE NEGATIVE 08/20/2020 1502   LEUKOCYTESUR NEGATIVE 08/20/2020 1502   Sepsis Labs Invalid input(s): PROCALCITONIN,  WBC,  LACTICIDVEN Microbiology Recent Results (from the past 240 hour(s))  Culture, blood (routine x 2)     Status: None (Preliminary result)   Collection Time: 08/20/20  5:55 PM   Specimen: BLOOD  Result Value Ref Range Status   Specimen Description   Final    BLOOD RIGHT ANTECUBITAL Performed at [facility]    Special Requests   Final    BOTTLES DRAWN AEROBIC AND ANAEROBIC Blood Culture adequate volume Performed at [facility]    Culture   Final    NO GROWTH 3 DAYS Performed at [facility], [place] [address], [place], [place] [address]    Report Status PENDING  Incomplete  Culture, blood (routine x 2)     Status: None (Preliminary result)   Collection Time: 08/20/20  6:16 PM   Specimen: BLOOD  Result Value Ref Range Status   Specimen Description   Final    BLOOD LEFT WRIST Performed at [facility]    Special Requests   Final    BOTTLES DRAWN AEROBIC ONLY Blood Culture  adequate volume Performed at [facility]    Culture   Final    NO GROWTH 3 DAYS Performed at [facility], [place] [address], [place], [place] [address]    Report Status PENDING  Incomplete  Resp Panel by RT-PCR (Flu A&B, Covid) Nasopharyngeal Swab     Status: None   Collection Time: 08/20/20  8:12 PM   Specimen: Nasopharyngeal Swab; Nasopharyngeal(NP) swabs in vial transport medium  Result Value Ref Range Status   SARS Coronavirus 2 by RT PCR NEGATIVE NEGATIVE Final    Comment: (NOTE) SARS-CoV-2 target nucleic acids are NOT DETECTED.  The SARS-CoV-2 RNA is generally detectable in upper respiratory specimens during the acute phase of infection. The lowest concentration of SARS-CoV-2 viral copies this assay can detect is 138 copies/mL. A negative result does not preclude SARS-Cov-2 infection and should not be used as the sole basis for treatment or other patient management decisions. A negative result may occur with  improper specimen collection/handling, submission of specimen other than nasopharyngeal swab, presence of viral mutation(s) within the areas targeted by this assay, and inadequate number of viral copies(<138 copies/mL). A negative result must be combined with clinical observations, patient history, and epidemiological information. The expected result is Negative.  Fact Sheet for Patients:  EntrepreneurPulse.com.au  Fact Sheet for Healthcare Providers:  IncredibleEmployment.be  This test is no t yet approved or cleared by the [place] FDA and  has been authorized for detection and/or diagnosis of SARS-CoV-2 by FDA under an Emergency Use Authorization (EUA). This EUA will remain  in effect (meaning this test can be used) for the duration of the COVID-19 declaration under Section 564(b)(1) of the Act, 21 U.S.C.section 360bbb-3(b)(1), unless the authorization is terminated  or revoked sooner.        Influenza A by PCR NEGATIVE NEGATIVE Final   Influenza B by PCR NEGATIVE NEGATIVE Final    Comment: (NOTE) The Xpert Xpress SARS-CoV-2/FLU/RSV plus assay is intended as an aid in the diagnosis of influenza from Nasopharyngeal swab specimens and should not be used as a sole basis for treatment. Nasal washings and aspirates are unacceptable for Xpert Xpress SARS-CoV-2/FLU/RSV testing.  Fact Sheet for Patients: EntrepreneurPulse.com.au  Fact Sheet for Healthcare Providers: IncredibleEmployment.be  This test is not yet approved or cleared by the [place] FDA and has been authorized for detection and/or diagnosis of SARS-CoV-2 by FDA under an Emergency Use Authorization (EUA). This EUA will remain in effect (meaning this test can be used) for the duration of the COVID-19 declaration under Section  564(b)(1) of the Act, 21 U.S.C. section 360bbb-3(b)(1), unless the authorization is terminated or revoked.  Performed at [facility]   Surgical pcr screen     Status: None   Collection Time: 08/21/20  3:13 AM   Specimen: Nasal Mucosa; Nasal Swab  Result Value Ref Range Status   MRSA, PCR NEGATIVE NEGATIVE Final   Staphylococcus aureus NEGATIVE NEGATIVE Final    Comment: (NOTE) The Xpert SA Assay (FDA approved for NASAL specimens in patients 65 years of age and older), is one component of a comprehensive surveillance program. It is not intended to diagnose infection nor to guide or monitor treatment. Performed at [facility], [place] [address], [place], [place] [address]   Body fluid culture w Gram Stain     Status: None (Preliminary result)   Collection Time: 08/21/20  9:13 AM   Specimen: PATH Cytology Pleural fluid  Result Value Ref Range Status   Specimen Description PLEURAL FLUID  Final   Special Requests NONE  Final   Gram Stain   Final    RARE WBC PRESENT, PREDOMINANTLY MONONUCLEAR RARE GRAM POSITIVE COCCI IN  PAIRS FEW GRAM VARIABLE ROD    Culture   Final    NO GROWTH 2 DAYS Performed at [facility], [place] [address] [place], [place] [address]    Report Status PENDING  Incomplete     Time coordinating discharge: Over 30 minutes  SIGNED:   Darliss Cheney, MD  [facility] 08/23/2020, 11:14 AM  If 7PM-7AM, please contact night-coverage [email or web address]

## 2020-08-23 NOTE — Progress Notes (Signed)
Patient ambulated in hallway 450 feet with nursing staff on room air. Oxygen remained 98-100 percent on room air.  Heart rate 122-126 while ambulating . Patient tolerated well. Will monitor patient. Isaic Syler, Randall An RN

## 2020-08-24 LAB — BODY FLUID CULTURE W GRAM STAIN: Culture: NO GROWTH

## 2020-08-25 ENCOUNTER — Other Ambulatory Visit: Payer: Self-pay

## 2020-08-25 DIAGNOSIS — J90 Pleural effusion, not elsewhere classified: Secondary | ICD-10-CM

## 2020-08-25 DIAGNOSIS — J9383 Other pneumothorax: Secondary | ICD-10-CM

## 2020-08-25 LAB — CULTURE, BLOOD (ROUTINE X 2)
Culture: NO GROWTH
Culture: NO GROWTH
Special Requests: ADEQUATE
Special Requests: ADEQUATE

## 2020-08-26 ENCOUNTER — Other Ambulatory Visit: Payer: Self-pay

## 2020-08-26 ENCOUNTER — Ambulatory Visit (INDEPENDENT_AMBULATORY_CARE_PROVIDER_SITE_OTHER): Payer: No Typology Code available for payment source

## 2020-08-26 ENCOUNTER — Encounter: Payer: Self-pay | Admitting: Adult Health

## 2020-08-26 ENCOUNTER — Ambulatory Visit: Payer: No Typology Code available for payment source | Admitting: Adult Health

## 2020-08-26 VITALS — BP 112/64 | HR 86 | Temp 97.8°F | Ht 66.0 in | Wt 122.6 lb

## 2020-08-26 DIAGNOSIS — J90 Pleural effusion, not elsewhere classified: Secondary | ICD-10-CM

## 2020-08-26 DIAGNOSIS — J9383 Other pneumothorax: Secondary | ICD-10-CM | POA: Diagnosis not present

## 2020-08-26 DIAGNOSIS — D649 Anemia, unspecified: Secondary | ICD-10-CM | POA: Insufficient documentation

## 2020-08-26 IMAGING — DX DG CHEST 2V
2 series · 2 of 2 positions shown · non-contrast
Comparison: [DATE]

CLINICAL DATA: Pleural effusion.  Pneumothorax.

EXAM:
CHEST - 2 VIEW

[chest pa]
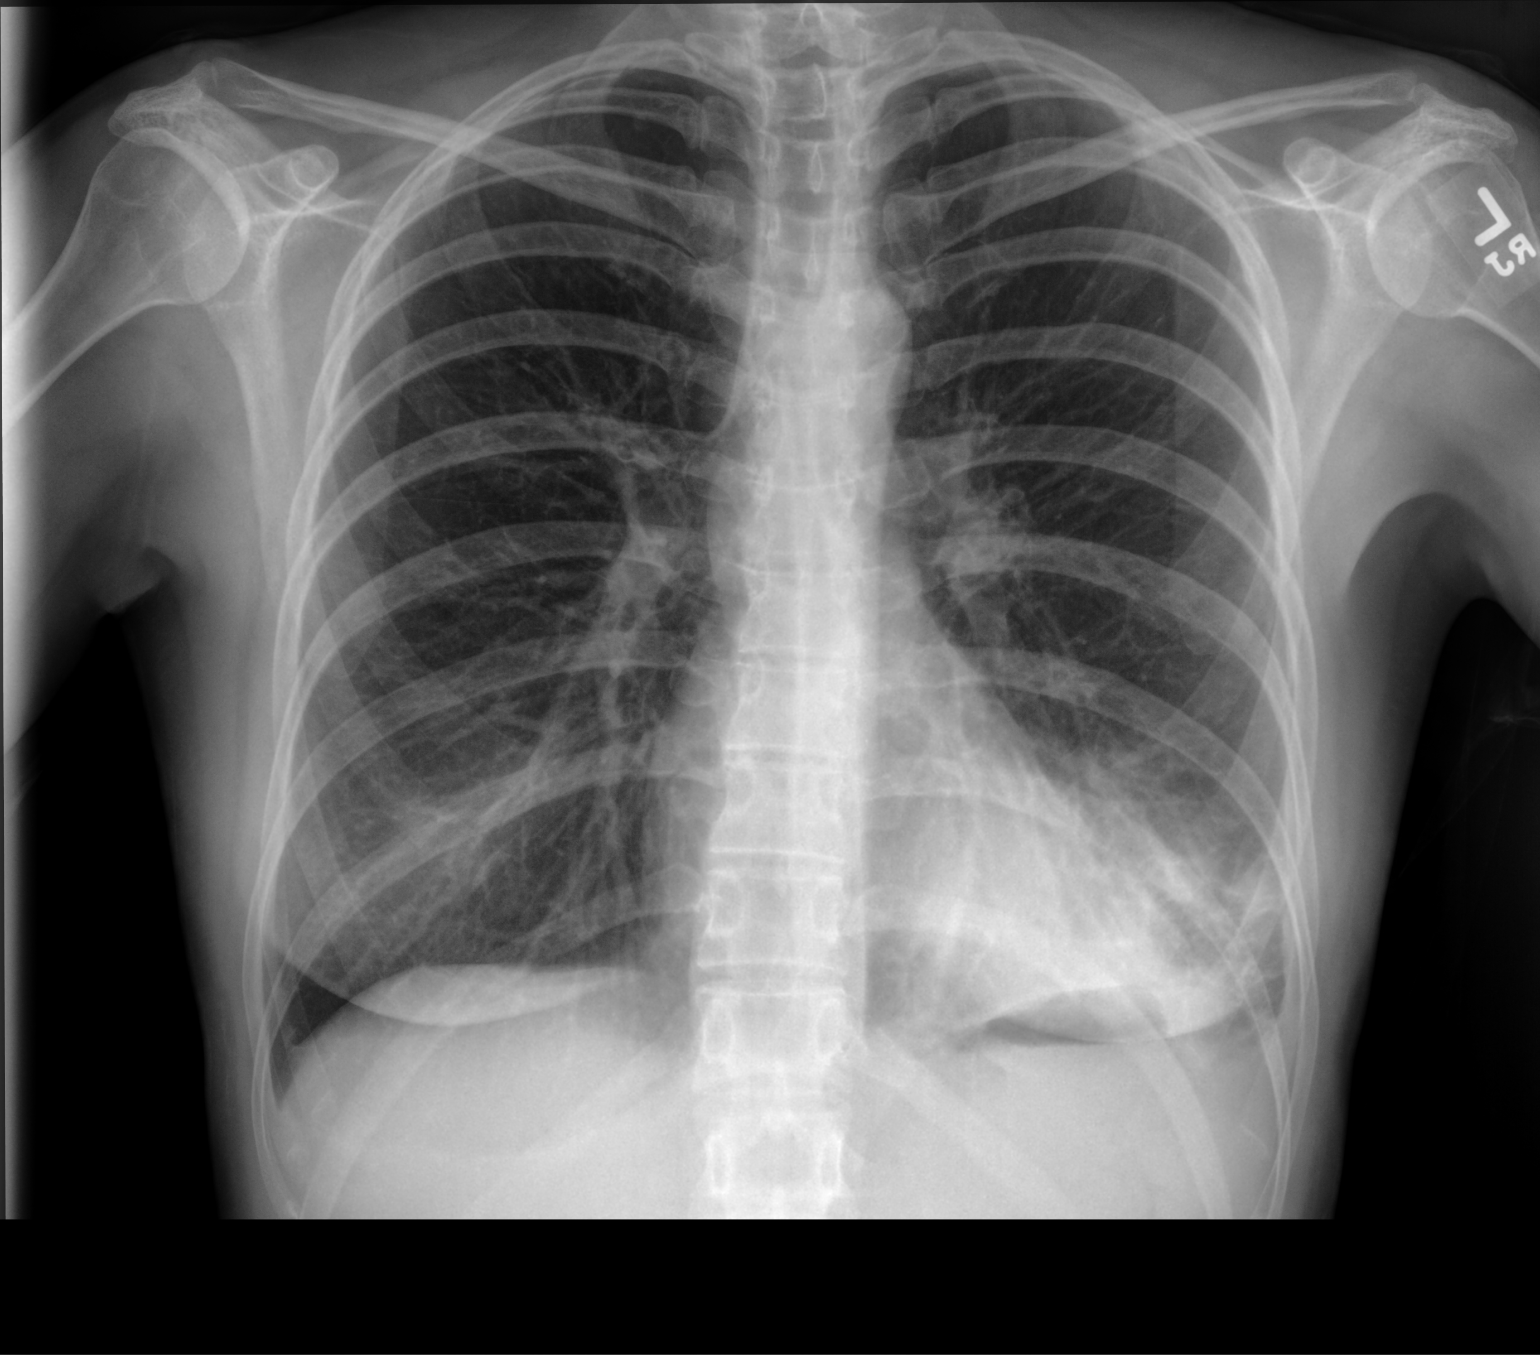

[chest lat]
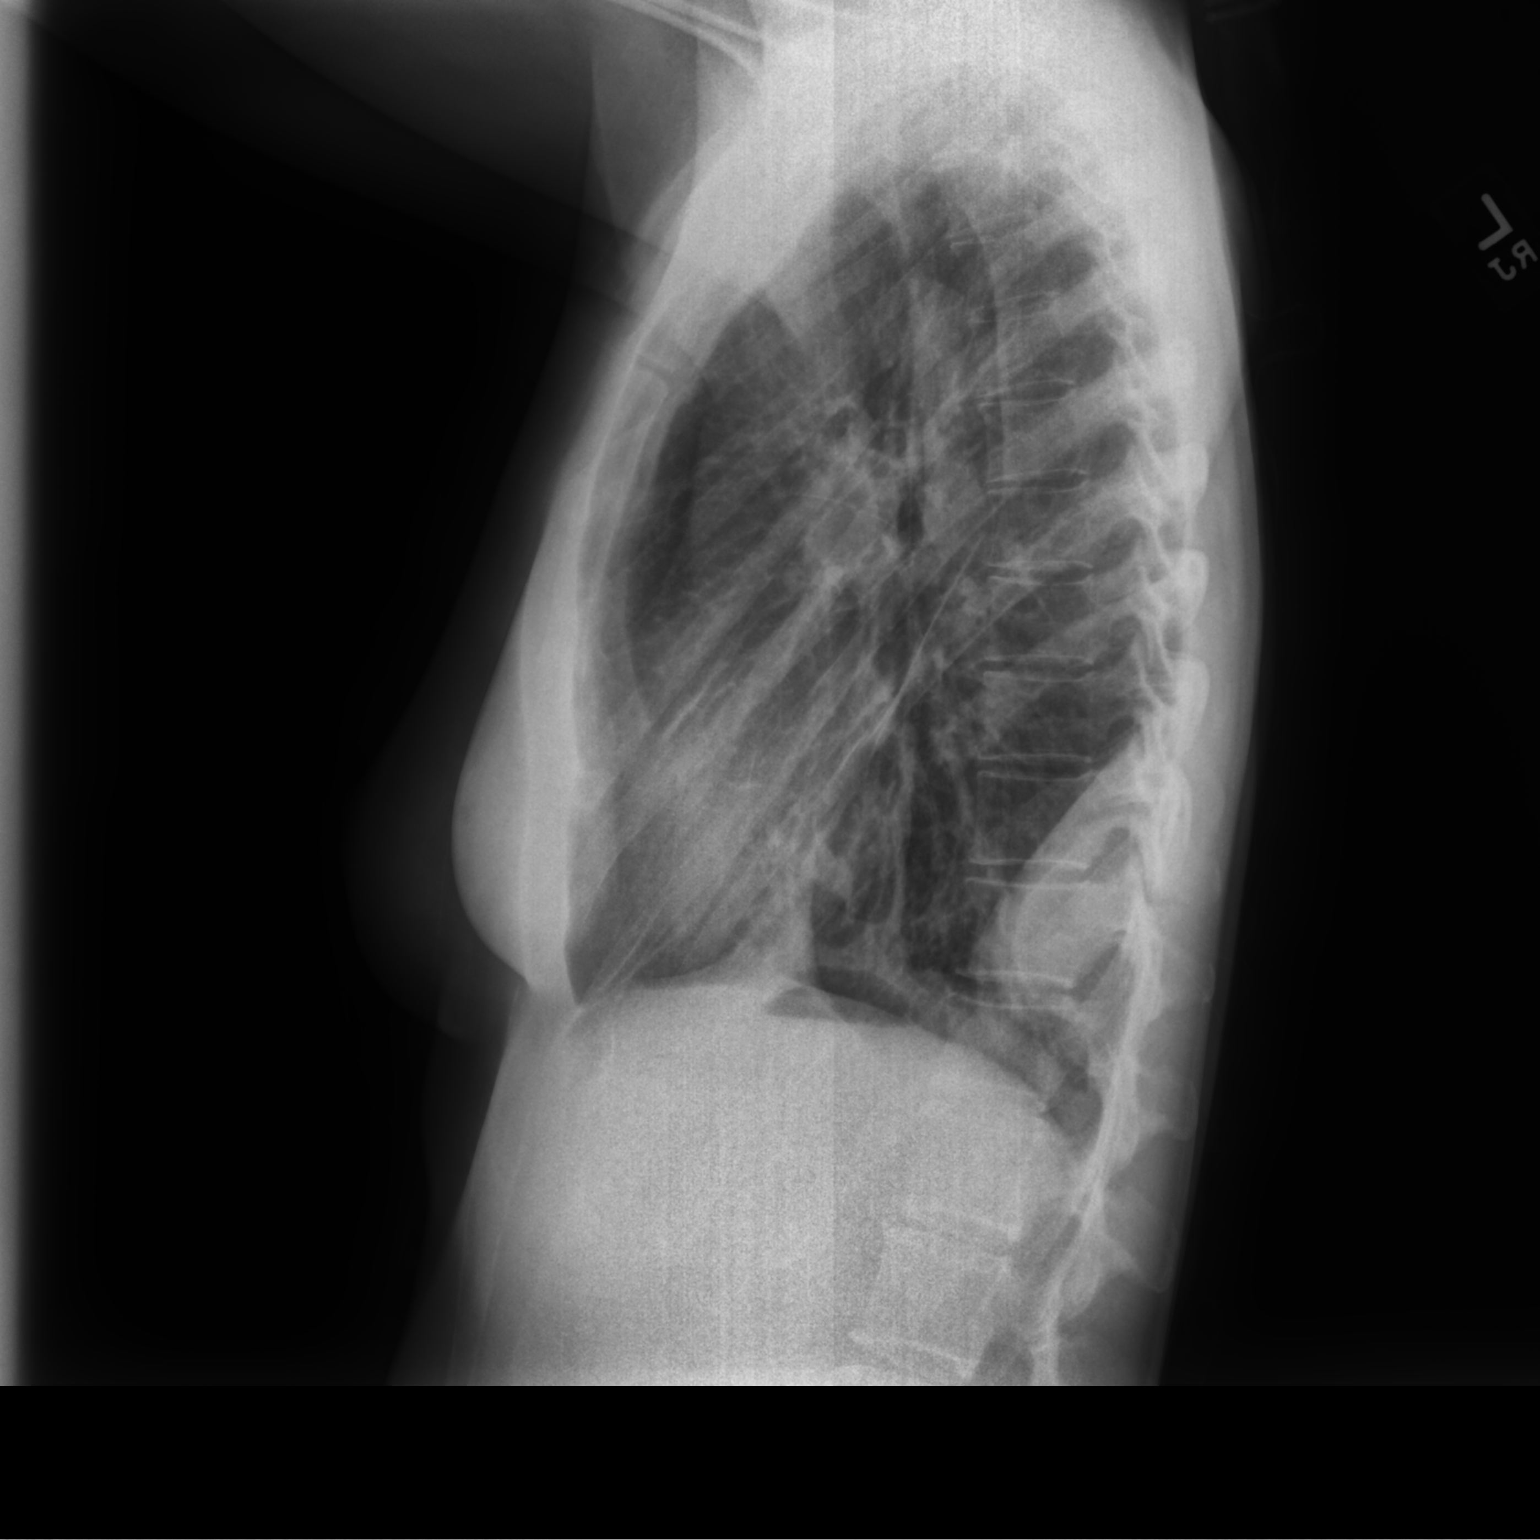

[2 of 2 positions shown; findings below may reference images not displayed]

FINDINGS: Minimal residual pneumothorax on the right evident without tension
component. There is persistent airspace opacity in the left lower
lobe with small pleural effusion. Lungs elsewhere clear. Heart size
and pulmonary vascularity are within normal limits. No adenopathy.
No bone lesions.
IMPRESSION: Minimal persistent right apical pneumothorax without tension
component. Persistent small left pleural effusion with
consolidation/patchy airspace opacity left base region. Lungs
elsewhere clear. Heart size normal.

## 2020-08-26 NOTE — Assessment & Plan Note (Signed)
Right-sided pneumothorax-continues to remain small.  O2 saturations are good.  She has no apparent distress.  We will continue to follow radiographically.  Patient is a repeat chest x-ray in 2 weeks.  Plan  Patient Instructions  Finish Antibiotics as directed.  Activity as tolerated.  Follow with Surgeon as planned Follow up with Primary care doctor next week  Follow up in 2 weeks with Dr. Francine Graven with chest xray and As needed   Please contact office for sooner follow up if symptoms do not improve or worsen or seek emergency care

## 2020-08-26 NOTE — Progress Notes (Signed)
@Patient  ID: , female    DOB: 06-12-1985, 35 y.o.   MRN: [id number]  Chief Complaint  Patient presents with  . Follow-up    Referring provider: [id number], MD  HPI: 35 year old female never smoker seen for initial pulmonary consult during hospitalization August 21, 2020.  Patient was admitted with pleuritic chest pain found to have a left-sided pleural effusion and a right sided pneumothorax.  Prior to admission had had surgery in [place] August 23, 2020 for endometriosis with a laparoscopic total hysterectomy, appendectomy and partial bowel resection approximately 6 cm.  TEST/EVENTS :  CT chest August 20, 2020 large volume density, left pleural effusion approximately 60% of the left hemothorax volume.  Passive atelectasis in the left lower lobe and lingula.,  Small to moderate right pneumothorax, subcutaneous gas along the left and right chest wall negative for PE, subcutaneous gas extending along the abdominal wall from the pubic symphysis to the costal margins left and right.  08/26/2020 Follow up : Pleural effusion, pneumothorax Patient returns for a follow-up visit.  She was recently seen for pulmonary consult during recent hospitalization earlier this week for pleural effusion and pneumothorax.  Patient has a history of endometriosis.  She was seeing a endometriosis specialist in [place] 10/26/2020.  Underwent a laparoscopic total hysterectomy, appendectomy and partial bowel resection (approximately 6 cm removed).  She did have acute blood loss anemia during that hospitalization and required transfusion x2 PRBCs.  Patient says postop she had significant subcutaneous emphysema.  Was discharged home and developed ongoing shortness of breath and pleuritic pain.  She presented to the emergency room on April 2 and was found to have a large volume pleural effusion on the left and a small to moderate right pneumothorax.  Along with scattered subcutaneous emphysema/gas. Patient underwent a  thoracentesis on April 3 for her left pleural effusion with 1.4 L of bloody fluid removed.  This was repeated on April 4 with 600 cc of dark bloody fluid removed.  Pleural fluid labs were consistent with an exudative process.  She was treated for possible underlying pneumonia with IV antibiotics and discharged on Omnicef. Pleural fluid cytology showed acute and chronic inflammation, atypical cells were favored to be reactive in nature pleural fluid culture was negative. Since discharge patient is starting to feel some better.  She says she still has low energy decreased activity tolerance.  She has started to walk and was able to walk yesterday for a mile.  She does get winded with heavy activity. She denies any hemoptysis, chest pain, orthopnea.  Patient has some tenderness along the left upper chest wall.  Chest x-ray today was reviewed and shows minimum persistent right apical pneumothorax without tension component.  And a persistent small left pleural effusion with consolidation in the left base. Patient says appetite is good with no vomiting or diarrhea.   No Known Allergies  Immunization History  Administered Date(s) Administered  . PFIZER(Purple Top)SARS-COV-2 Vaccination 04/22/2020    Past Medical History:  Diagnosis Date  . Allergies   . Anxiety   . Family history of breast cancer 05/05/2020  . Family history of colon cancer 05/05/2020  . Family history of kidney cancer 05/05/2020  . Family history of pancreatic cancer 05/05/2020  . Internal hemorrhoids   . Pneumothorax     Tobacco History: Social History   Tobacco Use  Smoking Status Never Smoker  Smokeless Tobacco Never Used   Counseling given: Not Answered   Outpatient Medications Prior to Visit  Medication Sig  Dispense Refill  . acetaminophen (TYLENOL) 500 MG tablet Take 1,000 mg by mouth every 6 (six) hours as needed for moderate pain or headache.    . cefdinir (OMNICEF) 300 MG capsule Take 1 capsule (300 mg  total) by mouth 2 (two) times daily for 5 days. 10 capsule 0  . clonazePAM (KLONOPIN) 0.5 MG tablet Take 1 tablet by mouth daily as needed for anxiety.    . docusate sodium (COLACE) 100 MG capsule Take 100 mg by mouth daily.    . fluticasone (FLONASE) 50 MCG/ACT nasal spray Place 1 spray into both nostrils daily as needed for allergies.    Marland Kitchen ondansetron (ZOFRAN ODT) 4 MG disintegrating tablet Take 1 tablet (4 mg total) by mouth every 8 (eight) hours as needed for nausea or vomiting. 20 tablet 0  . Simethicone (GAS-X PO) Take 1 tablet by mouth daily as needed (gas/bloating).    Marland Kitchen ZOFRAN 4 MG tablet Take 4 mg by mouth every 6 (six) hours as needed for nausea or vomiting.    . cholecalciferol (VITAMIN D3) 25 MCG (1000 UNIT) tablet Take 1,000 Units by mouth daily. (Patient not taking: Reported on 08/26/2020)    . dicyclomine (BENTYL) 20 MG tablet Take 1 tablet (20 mg total) by mouth 3 (three) times daily as needed for spasms. (Patient not taking: No sig reported) 90 tablet 2  . Ferrous Sulfate (IRON PO) Take 1 each by mouth daily. (Patient not taking: Reported on 08/26/2020)    . vitamin B-12 (CYANOCOBALAMIN) 1000 MCG tablet Take 1,000 mcg by mouth daily. (Patient not taking: Reported on 08/26/2020)    . Vitamin D, Ergocalciferol, (DRISDOL) 1.25 MG (50000 UNIT) CAPS capsule Take 50,000 Units by mouth. (Patient not taking: No sig reported)     No facility-administered medications prior to visit.     Review of Systems:   Constitutional:   No  weight loss, night sweats,  Fevers, chills,  +fatigue, or  lassitude.  HEENT:   No headaches,  Difficulty swallowing,  Tooth/dental problems, or  Sore throat,                No sneezing, itching, ear ache, nasal congestion, post nasal drip,   CV:  No chest pain,  Orthopnea, PND, swelling in lower extremities, anasarca, dizziness, palpitations, syncope.   GI  No heartburn, indigestion, abdominal pain, nausea, vomiting, diarrhea, change in bowel habits, loss of  appetite, bloody stools.   Resp:    No chest wall deformity  Skin: no rash or lesions.  GU: no dysuria, change in color of urine, no urgency or frequency.  No flank pain, no hematuria   MS:  No joint pain or swelling.  No decreased range of motion.  No back pain.    Physical Exam  BP 112/64 (BP Location: Left Arm, Cuff Size: Normal)   Pulse 86   Temp 97.8 F (36.6 C) (Temporal)   Ht 5\' 6"  (1.676 m)   Wt 122 lb 9.6 oz (55.6 kg)   LMP 05/18/2020   SpO2 98% Comment: RA  BMI 19.79 kg/m   GEN: A/Ox3; pleasant , NAD, thin    HEENT:  Ramirez-Perez/AT,    NOSE-clear, THROAT-clear, no lesions, no postnasal drip or exudate noted.   NECK:  Supple w/ fair ROM; no JVD; normal carotid impulses w/o bruits; no thyromegaly or nodules palpated; no lymphadenopathy.    RESP  Clear  P & A; w/o, wheezes/ rales/ or rhonchi. no accessory muscle use, no dullness to percussion  CARD:  RRR, no m/r/g, no peripheral edema, pulses intact, no cyanosis or clubbing.  GI:   Soft & nt; nml bowel sounds; no organomegaly or masses detected.  Laparoscopic incisions appeared to be intact with Steri-Strips in place no redness noted.  Musco: Warm bil, no deformities or joint swelling noted.   Neuro: alert, no focal deficits noted.    Skin: Warm, no lesions or rashes    Lab Results:  CBC    Component Value Date/Time   WBC 10.8 (H) 08/23/2020 0148   RBC 3.42 (L) 08/23/2020 0148   HGB 9.9 (L) 08/23/2020 0148   HGB 11.9 (L) 04/13/2020 1255   HCT 30.4 (L) 08/23/2020 0148   PLT 645 (H) 08/23/2020 0148   PLT 233 04/13/2020 1255   MCV 88.9 08/23/2020 0148   MCH 28.9 08/23/2020 0148   MCHC 32.6 08/23/2020 0148   RDW 13.8 08/23/2020 0148   LYMPHSABS 2.3 08/23/2020 0148   MONOABS 0.7 08/23/2020 0148   EOSABS 1.1 (H) 08/23/2020 0148   BASOSABS 0.1 08/23/2020 0148    BMET    Component Value Date/Time   NA 137 08/23/2020 0148   K 3.9 08/23/2020 0148   CL 104 08/23/2020 0148   CO2 25 08/23/2020 0148    GLUCOSE 81 08/23/2020 0148   BUN 8 08/23/2020 0148   CREATININE 0.54 08/23/2020 0148   CREATININE 0.73 04/13/2020 1255   CALCIUM 8.7 (L) 08/23/2020 0148   GFRNONAA >60 08/23/2020 0148   GFRNONAA >60 04/13/2020 1255    BNP No results found for: BNP  ProBNP No results found for: PROBNP  Imaging: DG Chest 1 View  Addendum Date: 08/21/2020   ADDENDUM REPORT: 08/21/2020 10:32 ADDENDUM: Study discussed by telephone with Dr. Jacqulyn Bath, hospitalist service on 08/21/2020 at 1028 hours. We discussed placing the patient on oxygen with repeat x-ray this afternoon to re-evaluate the right side pneumothorax. Electronically Signed   By: Odessa Fleming M.D.   On: 08/21/2020 10:32   Result Date: 08/21/2020 CLINICAL DATA:  35 year old female status post ultrasound-guided left side thoracentesis this morning. EXAM: CHEST  1 VIEW COMPARISON:  CTA chest yesterday. FINDINGS: Standing PA view of the chest at 0908 hours. Substantially regressed left side pleural effusion with no pneumothorax identified. Mediastinal contours remain normal. Contralateral right side pneumothorax is increased since yesterday, now small to moderate (pleural edge at the right posterior 4th rib level now. Right lung otherwise clear. No osseous abnormality identified. Negative visible bowel gas pattern. IMPRESSION: 1. No left-side pneumothorax following thoracentesis but the right side pneumothorax appears larger since the CTA yesterday, small to moderate. 2. Substantially regressed left pleural effusion. No new cardiopulmonary abnormality. Electronically Signed: By: Odessa Fleming M.D. On: 08/21/2020 09:58   DG Chest 2 View  Result Date: 08/26/2020 CLINICAL DATA:  Pleural effusion.  Pneumothorax. EXAM: CHEST - 2 VIEW COMPARISON:  August 23, 2020 FINDINGS: Minimal residual pneumothorax on the right evident without tension component. There is persistent airspace opacity in the left lower lobe with small pleural effusion. Lungs elsewhere clear. Heart size and  pulmonary vascularity are within normal limits. No adenopathy. No bone lesions. IMPRESSION: Minimal persistent right apical pneumothorax without tension component. Persistent small left pleural effusion with consolidation/patchy airspace opacity left base region. Lungs elsewhere clear. Heart size normal. Electronically Signed   By: Bretta Bang III M.D.   On: 08/26/2020 12:10   DG Chest 2 View  Result Date: 08/22/2020 CLINICAL DATA:  Pleural effusion Pneumothorax EXAM: CHEST - 2 VIEW COMPARISON:  08/21/2020 FINDINGS: Cardiomediastinal silhouette and pulmonary vasculature are within normal limits. Right apical pneumothorax is not significantly changed in size. Left pleural effusion is unchanged since prior exam. IMPRESSION: 1. Minimal right apical pneumothorax unchanged since prior exam. 2. Small to moderate left pleural effusion unchanged from prior exam. Electronically Signed   By: Acquanetta BellingFarhaan  Mir M.D.   On: 08/22/2020 07:44   CT Angio Chest PE W and/or Wo Contrast  Result Date: 08/20/2020 CLINICAL DATA:  Recent appendectomy.  Chest pain EXAM: CT ANGIOGRAPHY CHEST CT ABDOMEN AND PELVIS WITH CONTRAST TECHNIQUE: Multidetector CT imaging of the chest was performed using the standard protocol during bolus administration of intravenous contrast. Multiplanar CT image reconstructions and MIPs were obtained to evaluate the vascular anatomy. Multidetector CT imaging of the abdomen and pelvis was performed using the standard protocol during bolus administration of intravenous contrast. CONTRAST:  100mL OMNIPAQUE IOHEXOL 350 MG/ML SOLN COMPARISON:  CT abdomen pelvis 06/10/2020 FINDINGS: CTA CHEST FINDINGS Cardiovascular: No filling defects within the pulmonary arteries to suggest acute pulmonary embolism. No acute findings aorta great vessels. No pericardial fluid. Mediastinum/Nodes: No axillary or supraclavicular adenopathy. No mediastinal or hilar adenopathy. No pericardial fluid. Esophagus normal. Lungs/Pleura: New  large volume LEFT pleural effusion occupying approximately 60% of the LEFT hemithorax. The LEFT pleural fluid is intermediate density ranging size from HU from 20-25. There is complete passive atelectasis of the LEFT lower lobe. Atelectasis of the lingula additionally. Superior LEFT upper lobe is aerated. Additionally, there is a small to moderate volume RIGHT pneumothorax which collects anterior Lea within the RIGHT hemithorax in the supine position as well as over the RIGHT lung apex. Estimated volume approximately 15% of RIGHT hemithorax volume. Musculoskeletal: No rib fractures. Subcutaneous gas in the anterior chest wall along the costal margin. Review of the MIP images confirms the above findings. CT ABDOMEN and PELVIS FINDINGS Hepatobiliary: No focal hepatic lesion. No biliary duct dilatation. Common bile duct is normal. Pancreas: Pancreas is normal. No ductal dilatation. No pancreatic inflammation. Spleen: Low-density cysts within the spleen appears benign Adrenals/urinary tract: Adrenal glands and kidneys appear normal. Ureters and bladder normal. Stomach/Bowel: Stomach small normal. No complication LEFT lower quadrant related to recent appendectomy. The ascending, transverse and descending colon normal. Vascular/Lymphatic: Abdominal aorta is normal caliber. No periportal or retroperitoneal adenopathy. No pelvic adenopathy. Reproductive: A post hysterectomy. Small amount free fluid the pelvis. Abscess formation in the pelvis. No intraperitoneal free air. Other: extensive subcutaneous gas along the anterior abdominal and chest wall extending from the suprapubic region to the costal margin. Subcutaneous gas seen well on coronal image 57/series 10 Musculoskeletal: No aggressive osseous lesion.  No traumatic injury. Review of the MIP images confirms the above findings. IMPRESSION: Chest Impression: 1. Large volume intermediate density LEFT pleural effusion occupying approximately 60% of the LEFT hemithorax  volume. Complete passive atelectasis of the LEFT lower lobe and lingula. 2. Small to moderate volume RIGHT pneumothorax collects anteriorly within the RIGHT hemithorax. 3. Subcutaneous gas extends along the LEFT and RIGHT chest wall up to level of the breasts. 4. No evidence acute pulmonary embolism. Abdomen / Pelvis Impression: 1. Subcutaneous gas extends along the abdominal wall from the pubic symphysis to the costal margin on the LEFT and RIGHT. 2. No complicating features within the peritoneal space related to the appendectomy and hysterectomy. Small amount of free fluid the pelvis as expected. No organized fluid collections. No abscess. No intraperitoneal free air. Critical Value/emergent results were called by telephone at the time of interpretation on  08/20/2020 at 5:29 pm to provider Pifer, MD, who verbally acknowledged these results. Electronically Signed   By: Genevive Bi M.D.   On: 08/20/2020 17:32   CT ABDOMEN PELVIS W CONTRAST  Result Date: 08/20/2020 CLINICAL DATA:  Recent appendectomy.  Chest pain EXAM: CT ANGIOGRAPHY CHEST CT ABDOMEN AND PELVIS WITH CONTRAST TECHNIQUE: Multidetector CT imaging of the chest was performed using the standard protocol during bolus administration of intravenous contrast. Multiplanar CT image reconstructions and MIPs were obtained to evaluate the vascular anatomy. Multidetector CT imaging of the abdomen and pelvis was performed using the standard protocol during bolus administration of intravenous contrast. CONTRAST:  OMNIPAQUE IOHEXOL 350 MG/ML SOLN COMPARISON:  CT abdomen pelvis 06/10/2020 FINDINGS: CTA CHEST FINDINGS Cardiovascular: No filling defects within the pulmonary arteries to suggest acute pulmonary embolism. No acute findings aorta great vessels. No pericardial fluid. Mediastinum/Nodes: No axillary or supraclavicular adenopathy. No mediastinal or hilar adenopathy. No pericardial fluid. Esophagus normal. Lungs/Pleura: New large volume LEFT pleural  effusion occupying approximately 60% of the LEFT hemithorax. The LEFT pleural fluid is intermediate density ranging size from HU from 20-25. There is complete passive atelectasis of the LEFT lower lobe. Atelectasis of the lingula additionally. Superior LEFT upper lobe is aerated. Additionally, there is a small to moderate volume RIGHT pneumothorax which collects anterior Lea within the RIGHT hemithorax in the supine position as well as over the RIGHT lung apex. Estimated volume approximately 15% of RIGHT hemithorax volume. Musculoskeletal: No rib fractures. Subcutaneous gas in the anterior chest wall along the costal margin. Review of the MIP images confirms the above findings. CT ABDOMEN and PELVIS FINDINGS Hepatobiliary: No focal hepatic lesion. No biliary duct dilatation. Common bile duct is normal. Pancreas: Pancreas is normal. No ductal dilatation. No pancreatic inflammation. Spleen: Low-density cysts within the spleen appears benign Adrenals/urinary tract: Adrenal glands and kidneys appear normal. Ureters and bladder normal. Stomach/Bowel: Stomach small normal. No complication LEFT lower quadrant related to recent appendectomy. The ascending, transverse and descending colon normal. Vascular/Lymphatic: Abdominal aorta is normal caliber. No periportal or retroperitoneal adenopathy. No pelvic adenopathy. Reproductive: A post hysterectomy. Small amount free fluid the pelvis. Abscess formation in the pelvis. No intraperitoneal free air. Other: extensive subcutaneous gas along the anterior abdominal and chest wall extending from the suprapubic region to the costal margin. Subcutaneous gas seen well on coronal image 57/series 10 Musculoskeletal: No aggressive osseous lesion.  No traumatic injury. Review of the MIP images confirms the above findings. IMPRESSION: Chest Impression: 1. Large volume intermediate density LEFT pleural effusion occupying approximately 60% of the LEFT hemithorax volume. Complete passive  atelectasis of the LEFT lower lobe and lingula. 2. Small to moderate volume RIGHT pneumothorax collects anteriorly within the RIGHT hemithorax. 3. Subcutaneous gas extends along the LEFT and RIGHT chest wall up to level of the breasts. 4. No evidence acute pulmonary embolism. Abdomen / Pelvis Impression: 1. Subcutaneous gas extends along the abdominal wall from the pubic symphysis to the costal margin on the LEFT and RIGHT. 2. No complicating features within the peritoneal space related to the appendectomy and hysterectomy. Small amount of free fluid the pelvis as expected. No organized fluid collections. No abscess. No intraperitoneal free air. Critical Value/emergent results were called by telephone at the time of interpretation on 08/20/2020 at 5:29 pm to provider Pifer, MD, who verbally acknowledged these results. Electronically Signed   By: Genevive Bi M.D.   On: 08/20/2020 17:32   DG CHEST PORT 1 VIEW  Result Date: 08/23/2020 CLINICAL  DATA:  Shortness of breath.  Pneumothorax EXAM: PORTABLE CHEST 1 VIEW COMPARISON:  August 22, 2020 FINDINGS: Small right apical pneumothorax is stable. No tension component. There is airspace opacity in the left base with equivocal left pleural effusion. Lungs elsewhere clear. Heart size and pulmonary vascularity are normal. No adenopathy. No bone lesions. IMPRESSION: 1. Small right apical pneumothorax without tension component, stable. 2. Airspace opacity consistent with atelectasis and pneumonia left base with equivocal left pleural effusion. 3.  Heart size normal. Electronically Signed   By: Bretta Bang III M.D.   On: 08/23/2020 08:21   DG CHEST PORT 1 VIEW  Result Date: 08/22/2020 CLINICAL DATA:  34 year old female with pneumothorax. EXAM: PORTABLE CHEST 1 VIEW COMPARISON:  Chest radiograph dated 08/22/2020. FINDINGS: No significant interval change in the right apical pneumothorax since the earlier radiograph. Small left pleural effusion and left lung base  atelectasis. Stable cardiomediastinal silhouette. No acute osseous pathology. IMPRESSION: No significant interval change in the right apical pneumothorax since the earlier radiograph. Electronically Signed   By: Elgie Collard M.D.   On: 08/22/2020 17:20   DG CHEST PORT 1 VIEW  Result Date: 08/21/2020 CLINICAL DATA:  35 year old female with pneumothorax. Follow-up exam. EXAM: PORTABLE CHEST 1 VIEW COMPARISON:  Chest radiograph dated 08/21/2020. FINDINGS: Right apical pneumothorax measuring up to 2.3 cm to the apical pleural surface similar to prior radiograph. Small left pleural effusion and left lung base atelectasis or infiltrate, slightly increased since the prior radiograph. The cardiac silhouette is within limits. No acute osseous pathology. IMPRESSION: Right apical pneumothorax similar to prior radiograph. Electronically Signed   By: Elgie Collard M.D.   On: 08/21/2020 18:25   US THORACENTESIS ASP PLEURAL SPACE W/IMG GUIDE  Result Date: 08/21/2020 INDICATION: Patient with a history of recent laparoscopic surgery for endometriosis, bowel resection and appendectomy presents today with left pleural effusion. Interventional radiology asked to perform a therapeutic and diagnostic thoracentesis. EXAM: ULTRASOUND GUIDED THORACENTESIS MEDICATIONS: 1% lidocaine 10 mL COMPLICATIONS: None immediate. PROCEDURE: An ultrasound guided thoracentesis was thoroughly discussed with the patient and questions answered. The benefits, risks, alternatives and complications were also discussed. The patient understands and wishes to proceed with the procedure. Written consent was obtained. Ultrasound was performed to localize and mark an adequate pocket of fluid in the left chest. The area was then prepped and draped in the normal sterile fashion. 1% Lidocaine was used for local anesthesia. Under ultrasound guidance a 6 Fr Safe-T-Centesis catheter was introduced. Thoracentesis was performed. The catheter was removed and a  dressing applied. FINDINGS: A total of approximately 1.4 L of bloody fluid was removed. Samples were sent to the laboratory as requested by the clinical team. IMPRESSION: Successful ultrasound guided left thoracentesis yielding 1.4 L of pleural fluid. Read by: Alwyn Ren, NP Electronically Signed   By: Simonne Come M.D.   On: 08/21/2020 10:30      No flowsheet data found.  No results found for: NITRICOXIDE      Assessment & Plan:   Pleural effusion Exudative large left pleural effusion status post thoracentesis x2.  Pleural fluid cytology showed acute and chronic inflammation, atypical cells felt to be reactive in nature.  Pleural fluid cultures were negative. Patient is to finish antibiotics as directed.  Advance activity as tolerated.  We will recheck chest x-ray in 2 weeks.  Pneumothorax Right-sided pneumothorax-continues to remain small.  O2 saturations are good.  She has no apparent distress.  We will continue to follow radiographically.  Patient is a  repeat chest x-ray in 2 weeks.  Plan  Patient Instructions  Finish Antibiotics as directed.  Activity as tolerated.  Follow with Surgeon as planned Follow up with Primary care doctor next week  Follow up in 2 weeks with Dr. Francine Graven with chest xray and As needed   Please contact office for sooner follow up if symptoms do not improve or worsen or seek emergency care        Anemia Anemia.  Patient did have acute blood loss anemia during initial abdominal surgery.  She is continue follow-up with her primary care doctor and surgeon and will need follow-up labs going forward.     Rubye Oaks, NP 08/26/2020

## 2020-08-26 NOTE — Patient Instructions (Addendum)
Finish Antibiotics as directed.  Activity as tolerated.  Follow with Surgeon as planned Follow up with Primary care doctor next week  Follow up in 2 weeks with Dr. Francine Graven with chest xray and As needed   Please contact office for sooner follow up if symptoms do not improve or worsen or seek emergency care

## 2020-08-26 NOTE — Assessment & Plan Note (Signed)
Exudative large left pleural effusion status post thoracentesis x2.  Pleural fluid cytology showed acute and chronic inflammation, atypical cells felt to be reactive in nature.  Pleural fluid cultures were negative. Patient is to finish antibiotics as directed.  Advance activity as tolerated.  We will recheck chest x-ray in 2 weeks.

## 2020-08-26 NOTE — Assessment & Plan Note (Signed)
Anemia.  Patient did have acute blood loss anemia during initial abdominal surgery.  She is continue follow-up with her primary care doctor and surgeon and will need follow-up labs going forward.

## 2020-09-05 ENCOUNTER — Telehealth: Payer: Self-pay | Admitting: Adult Health

## 2020-09-05 DIAGNOSIS — J9383 Other pneumothorax: Secondary | ICD-10-CM

## 2020-09-05 DIAGNOSIS — R509 Fever, unspecified: Secondary | ICD-10-CM

## 2020-09-05 NOTE — Telephone Encounter (Signed)
Called and spoke with patient, advised that Dr. Celine Mans can see her at 11:30 tomorrow and to arrive by 11:10 for check in and a CXR prior to her visit.  She verified understanding.  Nothing further needed.  CXR order placed and ordered per SG, but put under Celine Mans so the results will go to her after resulted.

## 2020-09-05 NOTE — Telephone Encounter (Signed)
Pt stated that she is running a fever this morning and she is experiencing chest pain on the left side. Pt stated that the chest pain has gotten worse over the past few days and wanting advice. Pls regard [phone].

## 2020-09-05 NOTE — Telephone Encounter (Signed)
Primary Pulmonologist: has only seen Tammy has appt with Dr Francine Graven on 09/14/20 Last office visit and with whom: 08/26/2020 with T. Parrett NP What do we see them for (pulmonary problems): Pneumothorax  Last OV assessment/plan:  Pleural effusion Exudative large left pleural effusion status post thoracentesis x2.  Pleural fluid cytology showed acute and chronic inflammation, atypical cells felt to be reactive in nature.  Pleural fluid cultures were negative. Patient is to finish antibiotics as directed.  Advance activity as tolerated.  We will recheck chest x-ray in 2 weeks.  Pneumothorax Right-sided pneumothorax-continues to remain small.  O2 saturations are good.  She has no apparent distress.  We will continue to follow radiographically.  Patient is a repeat chest x-ray in 2 weeks.  Plan  Patient Instructions  Finish Antibiotics as directed.  Activity as tolerated.  Follow with Surgeon as planned Follow up with Primary care doctor next week  Follow up in 2 weeks with Dr. Francine Graven with chest xray and As needed   Please contact office for sooner follow up if symptoms do not improve or worsen or seek emergency care        Anemia Anemia.  Patient did have acute blood loss anemia during initial abdominal surgery.  She is continue follow-up with her primary care doctor and surgeon and will need follow-up labs going forward.    Reason for call: Pt stated that she is running a fever this morning (hovering around 100.4) and she is experiencing chest pain on the left side which comes with deep breathing. Pt stated that the chest pain has gotten worse over the past few days and wanting advice due to being worried since she had similar symptoms prior to last hospitalization. Patient denies any shortness of breath and states O2 level when she checks it is around 98%. Denies any cough and has not taken any medication for temperature.  Sarah please advise.  No Known Allergies  Immunization  History  Administered Date(s) Administered  . PFIZER(Purple Top)SARS-COV-2 Vaccination 04/22/2020

## 2020-09-05 NOTE — Telephone Encounter (Signed)
Needs a CXR and an OV. I know Yvonne Garcia and I have no openings today. Please see if there is a doc who does have an opening.

## 2020-09-06 ENCOUNTER — Ambulatory Visit (INDEPENDENT_AMBULATORY_CARE_PROVIDER_SITE_OTHER): Payer: No Typology Code available for payment source | Admitting: Internal Medicine

## 2020-09-06 ENCOUNTER — Encounter: Payer: Self-pay | Admitting: Internal Medicine

## 2020-09-06 ENCOUNTER — Ambulatory Visit (INDEPENDENT_AMBULATORY_CARE_PROVIDER_SITE_OTHER): Payer: No Typology Code available for payment source

## 2020-09-06 ENCOUNTER — Other Ambulatory Visit: Payer: Self-pay

## 2020-09-06 VITALS — BP 104/60 | HR 78 | Temp 97.9°F | Ht 66.0 in | Wt 123.2 lb

## 2020-09-06 DIAGNOSIS — J95811 Postprocedural pneumothorax: Secondary | ICD-10-CM

## 2020-09-06 DIAGNOSIS — R509 Fever, unspecified: Secondary | ICD-10-CM

## 2020-09-06 DIAGNOSIS — J9383 Other pneumothorax: Secondary | ICD-10-CM | POA: Diagnosis not present

## 2020-09-06 DIAGNOSIS — J869 Pyothorax without fistula: Secondary | ICD-10-CM

## 2020-09-06 IMAGING — DX DG CHEST 2V
2 series · 2 of 2 positions shown · non-contrast
Comparison: [DATE]

CLINICAL DATA: Chest pain, history of pneumothorax

EXAM:
CHEST - 2 VIEW

[chest pa]
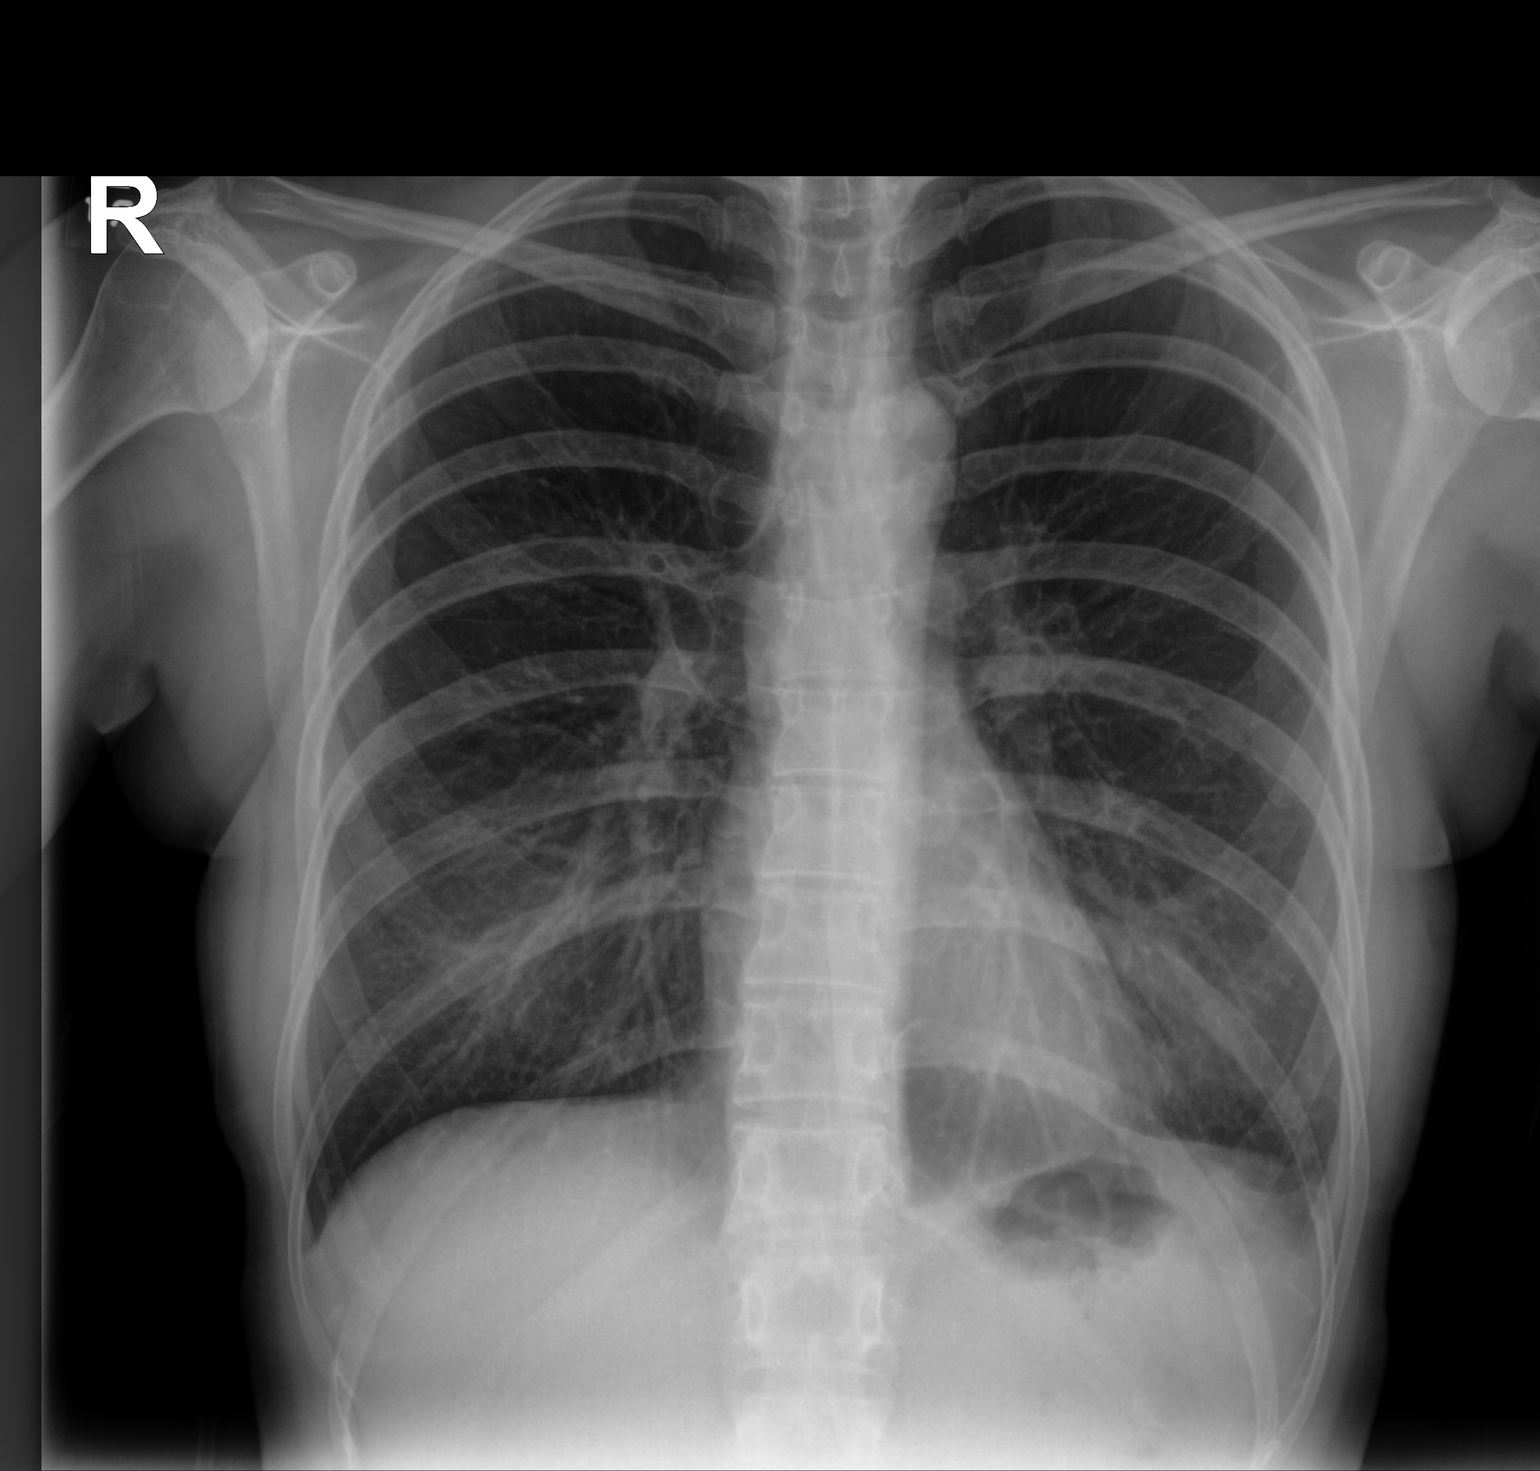

[chest lat]
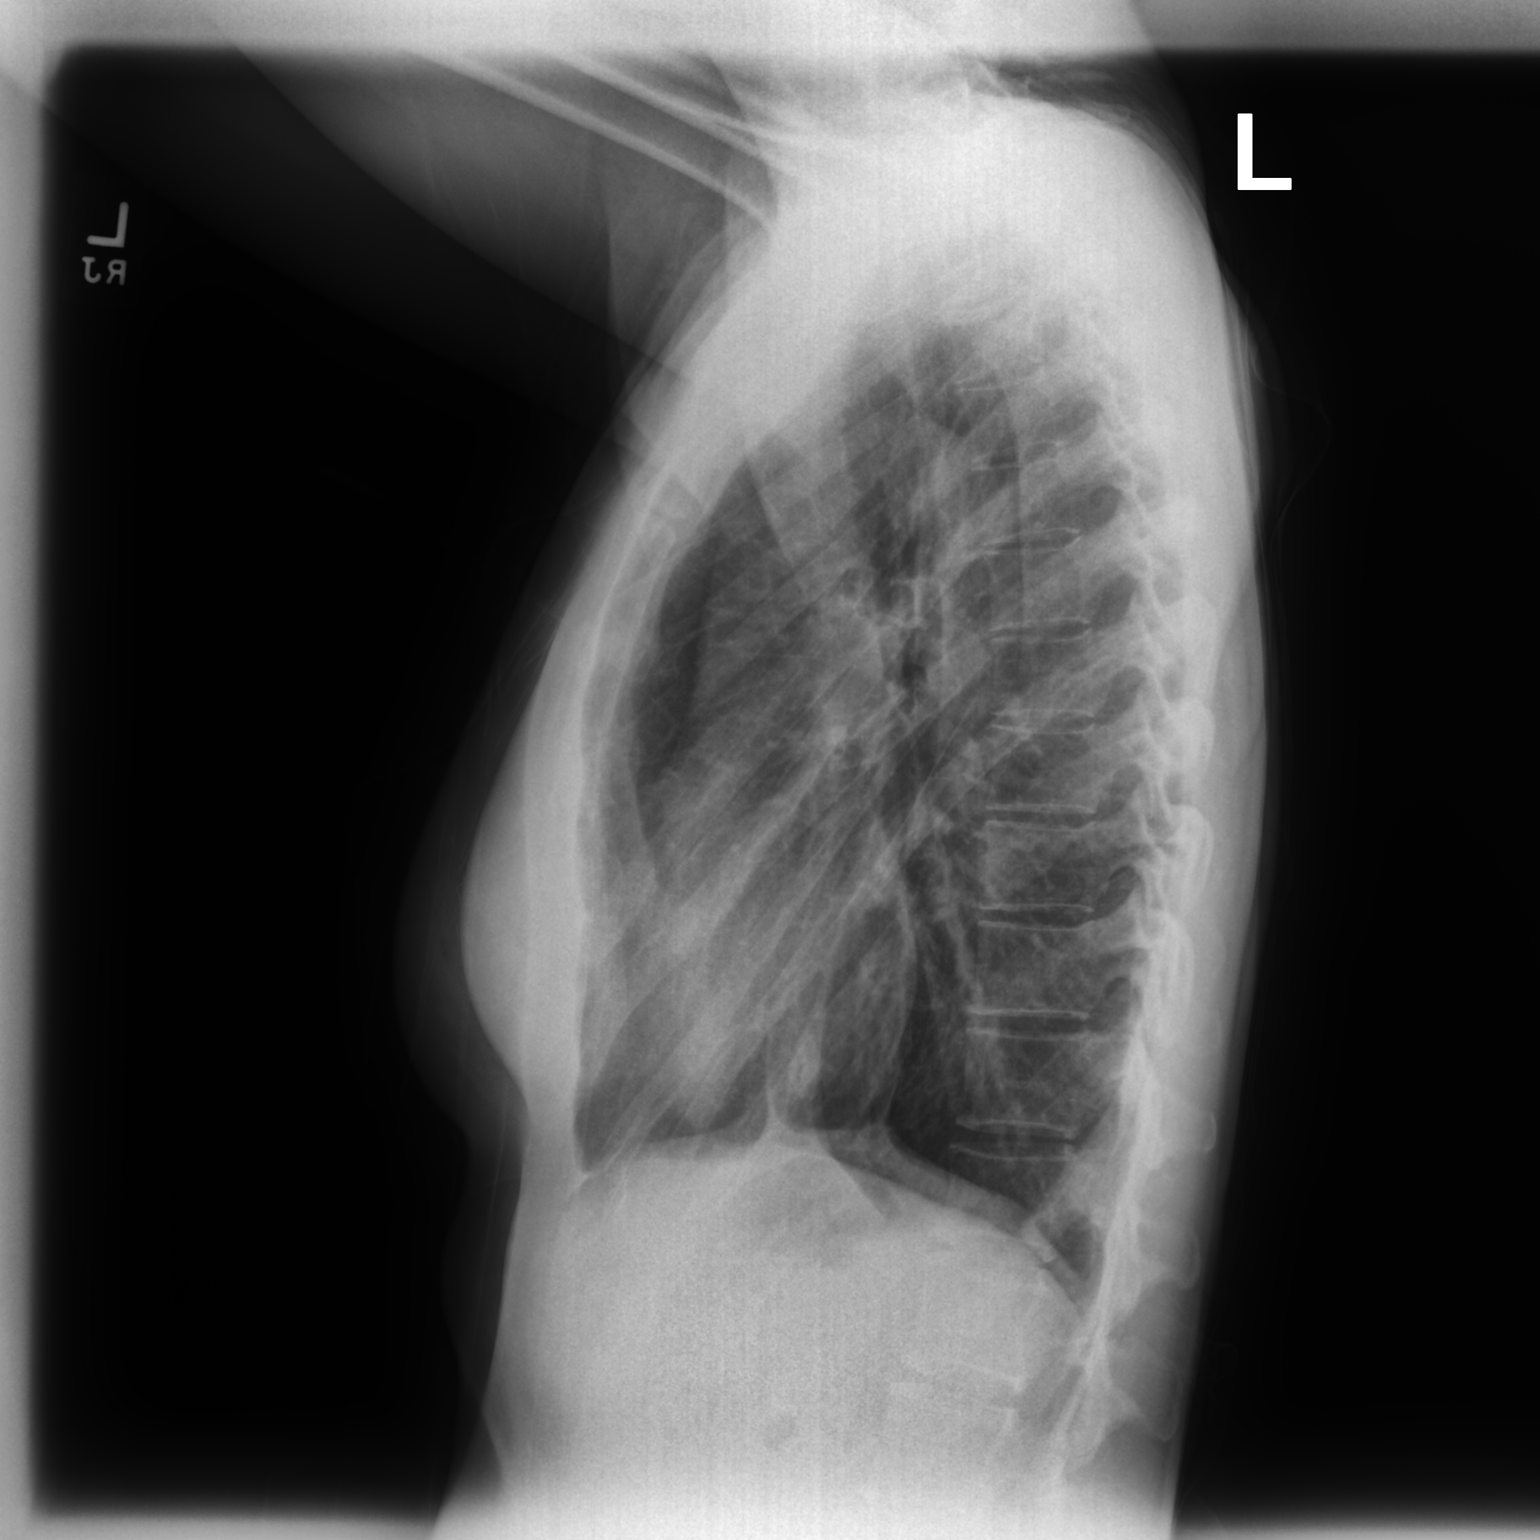

[2 of 2 positions shown; findings below may reference images not displayed]

FINDINGS: Improved aeration at the left lung base. No pneumothorax. Normal
heart size. No acute osseous abnormality.
IMPRESSION: Improved aeration at the left lung base.  No pneumothorax.

## 2020-09-06 NOTE — Patient Instructions (Signed)
The patient should have follow up scheduled with myself in 6 months.   Let me know if you have any recurrence of chest pain, shortness of breath before that.   Pneumothorax A pneumothorax is commonly called a collapsed lung. It is a condition in which air leaks from a lung and builds up between the thin layer of tissue that covers the lungs (visceral pleura) and the interior wall of the chest cavity (parietal pleura). The air gets trapped outside the lung, between the lung and the chest wall (pleural space). The air takes up space and prevents the lung from fully expanding. This condition sometimes occurs suddenly with no apparent cause. The buildup of air may be small or large. A small pneumothorax may go away on its own. A large pneumothorax will require treatment and hospitalization. What are the causes? This condition may be caused by:  Trauma and injury to the chest wall.  Surgery and other medical procedures.  A complication of an underlying lung problem, especially chronic obstructive pulmonary disease (COPD) or emphysema. Sometimes the cause of this condition is not known. What increases the risk? You are more likely to develop this condition if:  You have an underlying lung problem.  You smoke.  You are 52-63 years old, female, tall, and underweight.  You have a personal or family history of pneumothorax.  You have an eating disorder (anorexia nervosa). This condition can also happen quickly, even in people with no history of lung problems. What are the signs or symptoms? Sometimes a pneumothorax will have no symptoms. When symptoms are present, they can include:  Chest pain.  Shortness of breath.  Increased rate of breathing.  Bluish color to your lips or skin (cyanosis). How is this diagnosed? This condition may be diagnosed by:  A medical history and physical exam.  A chest X-ray, chest CT scan, or ultrasound. How is this treated? Treatment depends on how  severe your condition is. The goal of treatment is to remove the extra air and allow your lung to expand back to its normal size.  For a small pneumothorax: ? No treatment may be needed. ? Extra oxygen is sometimes used to make it go away more quickly.  For a large pneumothorax or one that is causing symptoms, a procedure is done to release the air from around your lungs. To do this, a health care provider may use: ? A needle with a syringe. This is used to suck air from a pleural space where no additional leakage is taking place. ? A chest tube. This is used to suck air where there is ongoing leakage into the pleural space. The chest tube may need to remain in place for several days until the air leak has healed.  In more severe cases, surgery may be needed to repair the damage that is causing the leak.  If you have multiple pneumothorax episodes or have an air leak that will not heal, a procedure called a pleurodesis may be done. A medicine is placed in the pleural space to irritate the tissues around the lung so that the lung will stick to the chest wall, seal any leaks, and stop any buildup of air in that space. If you have an underlying lung problem, severe symptoms, or a large pneumothorax you will usually need to stay in the hospital.   Follow these instructions at home: Lifestyle  Do not use any products that contain nicotine or tobacco, such as cigarettes and e-cigarettes. These are major  risk factors in pneumothorax. If you need help quitting, ask your health care provider.  Do not lift anything that is heavier than 10 lb (4.5 kg), or the limit that your health care provider tells you, until he or she says that it is safe.  Avoid activities that take a lot of effort (strenuous) for as long as told by your health care provider.  Return to your normal activities as told by your health care provider. Ask your health care provider what activities are safe for you.  Do not fly in an  airplane or scuba dive until your health care provider says it is okay. General instructions  Take over-the-counter and prescription medicines only as told by your health care provider.  If a cough or pain makes it difficult for you to sleep at night, try sleeping in a semi-upright position in a recliner or by using 2 or 3 pillows.  If you had a chest tube and it was removed, ask your health care provider when you can remove the bandage (dressing). While the dressing is in place, do not allow it to get wet.  Keep all follow-up visits as told by your health care provider. This is important. Contact a health care provider if:  You cough up thick mucus (sputum) that is yellow or green in color.  You were treated with a chest tube, and you have redness, increasing pain, or discharge at the site where it was placed. Get help right away if:  You have increasing chest pain or shortness of breath.  You have a cough that will not go away.  You begin coughing up blood.  You have pain that is getting worse or is not controlled with medicines.  The site where your chest tube was located opens up.  You feel air coming out of the site where the chest tube was placed.  You have a fever or persistent symptoms for more than 2-3 days. These symptoms may represent a serious problem that is an emergency. Do not wait to see if the symptoms will go away. Get medical help right away. Call your local emergency services (911 in the [place] Do not drive yourself to the hospital. Summary  A pneumothorax, commonly called a collapsed lung, is a condition in which air leaks from a lung and gets trapped between the lung and the chest wall (pleural space).  The buildup of air may be small or large. A small pneumothorax may go away on its own. A large pneumothorax will require treatment and hospitalization.  Treatment for this condition depends on how severe the pneumothorax is. The goal of treatment is to remove  the extra air and allow the lung to expand back to its normal size. This information is not intended to replace advice given to you by your health care provider. Make sure you discuss any questions you have with your health care provider. Document Revised: 01/07/2020 Document Reviewed: 01/07/2020 Elsevier Patient Education  2021 [employer].

## 2020-09-06 NOTE — Progress Notes (Signed)
Yvonne Garcia    [id number]    18-Nov-1985  Primary Care Physician:Sun, [name], MD Date of Appointment: 09/06/2020 Established Patient Visit  Chief complaint:  Lung collapse   HPI: Yvonne Garcia is a 35 y.o. woman with history of endometriosis s/p total laparoscopic hysterectomy and anemia who presents for follow up after having a spontaneous pneumothorax with empyema. Had thoracentesis in IR which showed empyema. Had repeat thoracentesis with Dr. Merrily Pew which showed an additional 600 cc of blood fluid and was treated with IV abx and discharged on omnicef.   Interval Updates: Upon obtaining further history, Yvonne Garcia has has had longstanding endometriosis for many years with debilitating pain and had significant extrauterine manifestations.  She had laparoscopic hysterectomy and lysis of adhesions which is over 3 hours long.  Surgery was march 22nd.  Following the surgery she had extensive subcutaneous emphysema in her back, arms, neck.  At that time she did not have a pneumothorax.  Unfortunately his records not available for review.  The surgery was done in [place] at [facility] with Dr. Tonia Brooms who is a endometriosis expert.  Here for follow up after seeing Yvonne Garcia in clinic. Was supposed to have repeat chest xray to ensure resolution of apical pneumothorax. She has completed her course of abx that she was discharged from the hospital for, received at least 7 days of antibiotics following complete drainage of empyema.  She is still has an low-grade fevers to 100.4 which is resolving with Tylenol, last was yesterday.  No localizing signs or symptoms of infection, no nausea vomiting diarrhea abdominal pain dysuria.  She has never had pneumothorax before this.  She has never had pleural effusion before.  She is a non-smoker.  I have reviewed the patient's family social and past medical history and updated as appropriate.   Past Medical History:  Diagnosis Date  .  Allergies   . Anxiety   . Family history of breast cancer 05/05/2020  . Family history of colon cancer 05/05/2020  . Family history of kidney cancer 05/05/2020  . Family history of pancreatic cancer 05/05/2020  . Internal hemorrhoids   . Pneumothorax     Past Surgical History:  Procedure Laterality Date  . ABDOMINAL HYSTERECTOMY    . APPENDECTOMY    . LAPAROSCOPIC BOWEL RESECTION    . TONSILLECTOMY    . WISDOM TOOTH EXTRACTION      Family History  Problem Relation Age of Onset  . Mental illness Mother        suicide.   . Pancreatic cancer Mother 32  . Breast cancer Mother 44  . Kidney cancer Father 56  . Colon cancer Maternal Grandfather        dx 73s  . Kidney cancer Paternal Grandfather        dx unknown age    Social History   Occupational History  . Not on file  Tobacco Use  . Smoking status: Never Smoker  . Smokeless tobacco: Never Used  Vaping Use  . Vaping Use: Never used  Substance and Sexual Activity  . Alcohol use: Yes    Comment: rare  . Drug use: Never  . Sexual activity: Not on file     Physical Exam: Blood pressure 104/60, pulse 78, temperature 97.9 F (36.6 C), temperature source Temporal, height 5\' 6"  (1.676 m), weight 123 lb 3.2 oz (55.9 kg), last menstrual period 05/18/2020, SpO2 99 %.  Gen:  No acute distress Lungs:    No increased respiratory effort, symmetric chest wall excursion, clear to auscultation bilaterally, no wheezes or crackles Abdomen is soft nontender nondistended, her laparoscopic scar surgeries are well-healed without any evidence of dehiscence or infection. CV:         Regular rate and rhythm; no murmurs, rubs, or gallops.  No pedal edema   Data Reviewed: Imaging: I have personally reviewed the CT Angio from April 2022 as well as chest xrays subsequently which show resolving pleural effusion on the left side. Today's xray shows near complete resolution of left sided pleural effusion. There is improvement in the right  apical ptx CT Chest does not show significant cysts or blebs.   PFTs:   Labs: Gram stain from pleural fluid shows WBCs, rare GPC in chains, few gram variable rods Plus fluid studies and chemistries show exudative effusion  Immunization status: Immunization History  Administered Date(s) Administered  . PFIZER(Purple Top)SARS-COV-2 Vaccination 08/09/2019, 08/30/2019, 04/22/2020    Assessment:  Pneumothorax, spontaneous with pleural effusion.  Empyema  Plan/Recommendations: Yvonne Garcia is a 35 year old man with history of endometriosis status post hysterectomy at the end of March 2022.  I believe her spontaneous pneumothorax was most likely postprocedural in the setting of laparoscopic surgery with invasive mechanical ventilation. In the setting of endometriosis, catamenial pneumothorax is in the differential - will reach out to pathology to see if any endometrial tissue was noted on the cytology. The specimen was noted to be dark red, but no hematocrit was sent.  It is possible she had underlying endometriosis accumulation which became superinfected causing her empyema.  Regardless radiographically her effusion and empyema have resolved.  She is completed an adequate course of antibiotics.  I am not sure what to make of her low-grade fevers but I do not think they are related to anything from a pulmonary pathology.  She can take NSAIDs for pleuritic chest pain.  I think this will continue to improve.  I have given her strict return to care precautions if she develops signs or symptoms of effusion or pneumothorax.  Otherwise I will see her back in 6 months.  She will let me know if she has anything in the interval.    There is no cure for endometriosis and although she is at hysterectomy she can still very well continue to have recurrence of catamenial effusions since she does have ovulatory cycles.  Unfortunately there is not much we can do for catamenial pneumothorax unless there is recurrence.   She has not been tolerant of hormonal suppression therapy and should she have recurrence she should be referred to thoracic surgery for evaluation of pleurodesis.  I spent 41 minutes on 09/06/2020 in care of this patient including face to face time and non-face to face time spent charting, review of outside records, and coordination of care.  Return to Care: Return in about 6 months (around 03/08/2021).   Durel Salts, MD Pulmonary and Critical Care Medicine [facility] [phone]

## 2020-09-14 ENCOUNTER — Ambulatory Visit: Payer: No Typology Code available for payment source | Admitting: Pulmonary Disease

## 2020-10-28 ENCOUNTER — Encounter: Payer: Self-pay | Admitting: *Deleted

## 2020-11-15 ENCOUNTER — Encounter: Payer: Self-pay | Admitting: *Deleted

## 2020-11-25 ENCOUNTER — Other Ambulatory Visit: Payer: Self-pay | Admitting: Family Medicine

## 2020-11-25 DIAGNOSIS — R1900 Intra-abdominal and pelvic swelling, mass and lump, unspecified site: Secondary | ICD-10-CM

## 2020-11-29 ENCOUNTER — Ambulatory Visit
Admission: RE | Admit: 2020-11-29 | Discharge: 2020-11-29 | Disposition: A | Discharge status: Home / Self Care | Payer: No Typology Code available for payment source | Source: Ambulatory Visit | Attending: Family Medicine | Admitting: Family Medicine

## 2020-11-29 DIAGNOSIS — R1900 Intra-abdominal and pelvic swelling, mass and lump, unspecified site: Secondary | ICD-10-CM

## 2020-11-29 IMAGING — US US PELVIS LIMITED
1 series · 14 of 21 positions shown · non-contrast
Comparison: None.

CLINICAL DATA: Palpable small firm abdominal mass inside the lower
umbilicus, recent hysterectomy, history of bowel resection,
endometriosis appendectomy

EXAM:
LIMITED ULTRASOUND OF PELVIS
TECHNIQUE: Limited transabdominal ultrasound examination of the pelvis was
performed.

[Series 1: us pelvis limited · 0.04mm/px · 14 of 21 slices shown]
[im 1/21]
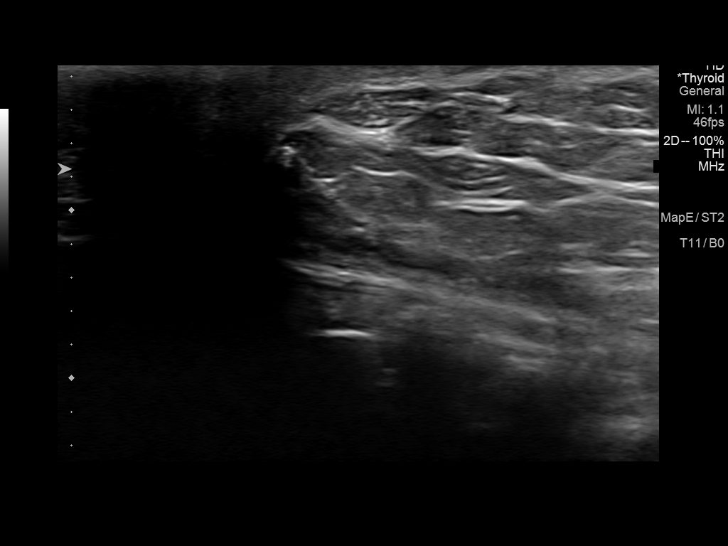
[im 3/21]
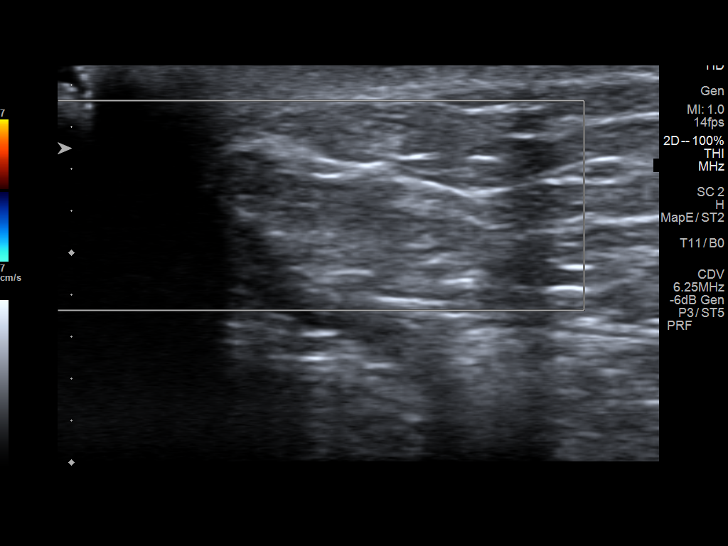
[im 4/21]
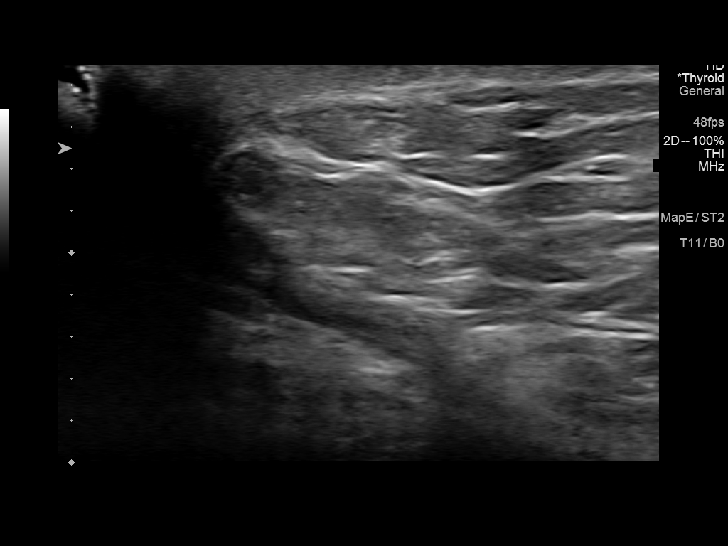
[im 6/21]
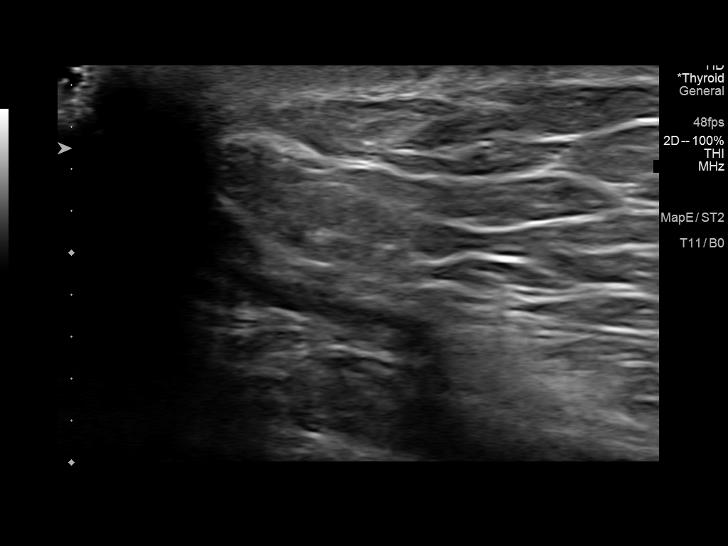
[im 7/21]
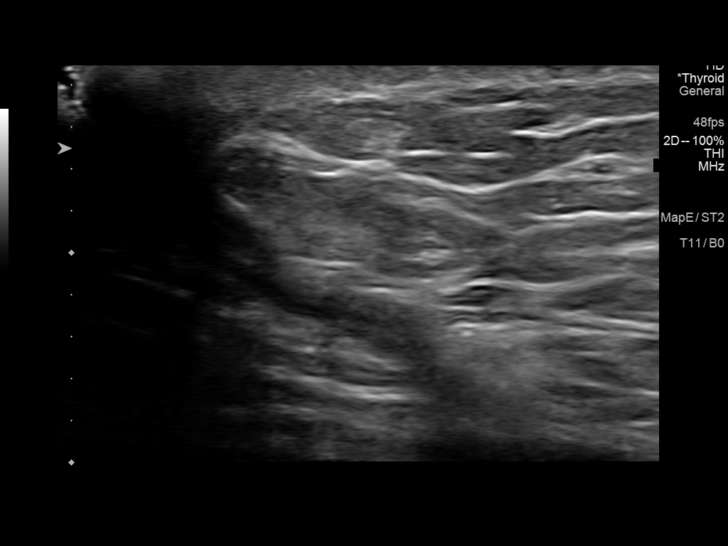
[im 9/21]
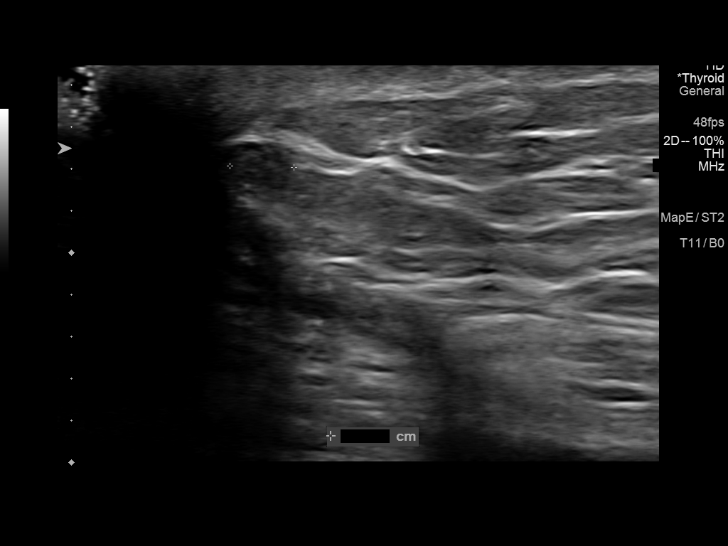
[im 10/21]
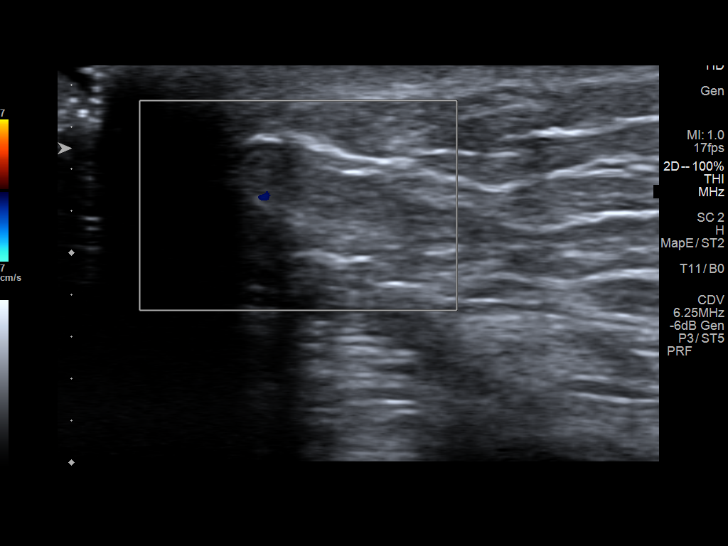
[im 12/21]
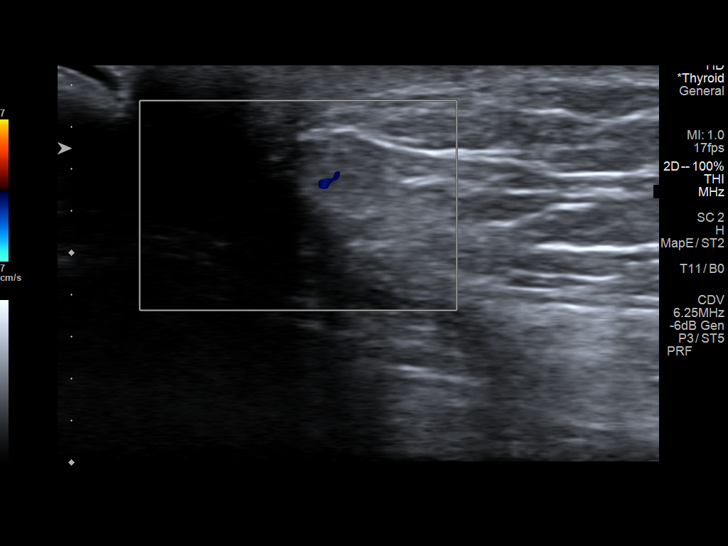
[im 13/21]
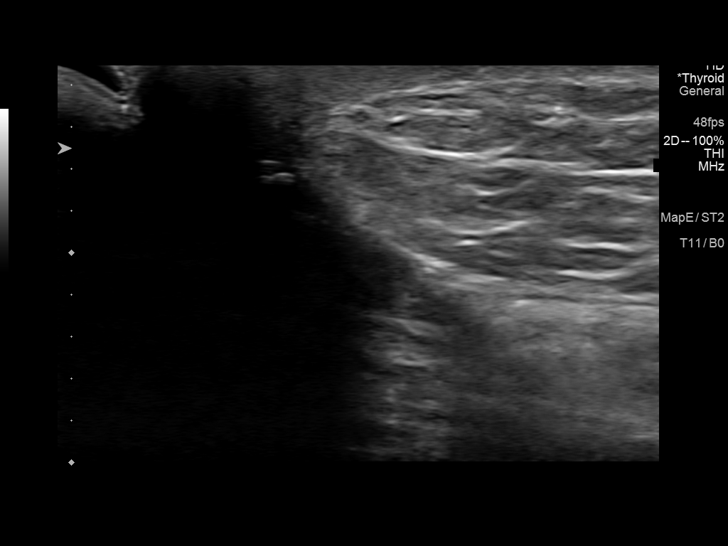
[im 15/21]
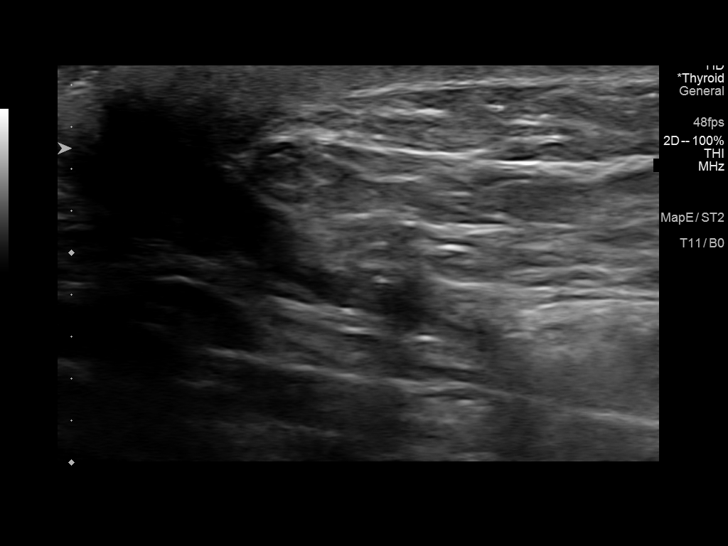
[im 16/21]
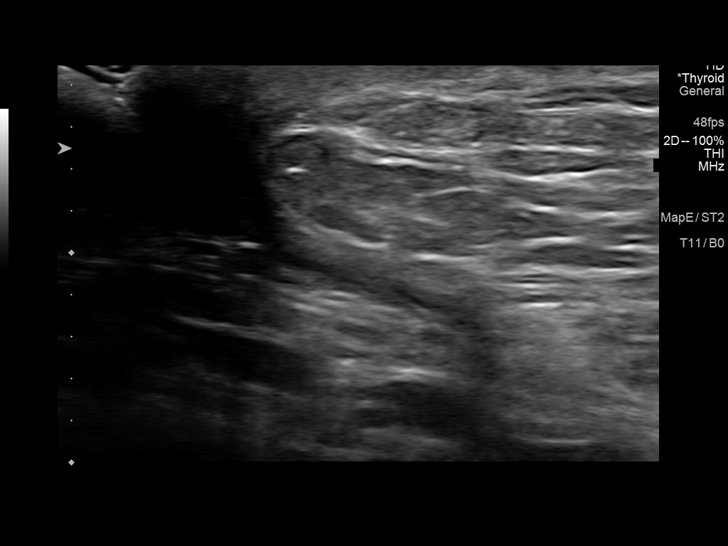
[im 18/21]
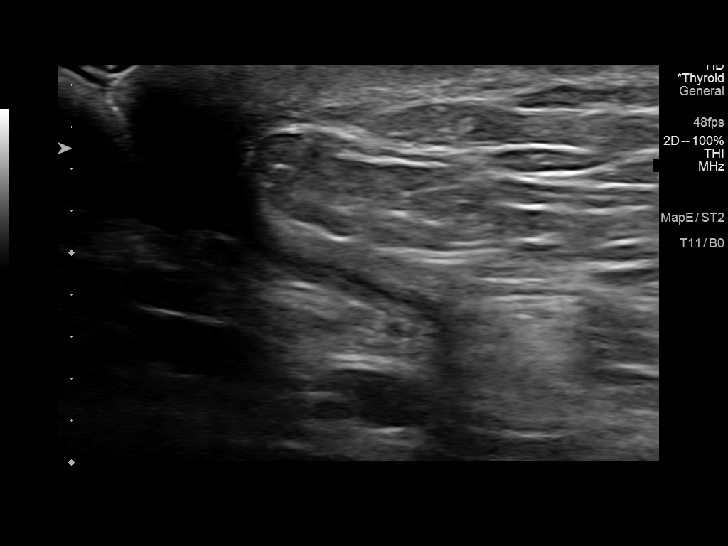
[im 19/21]
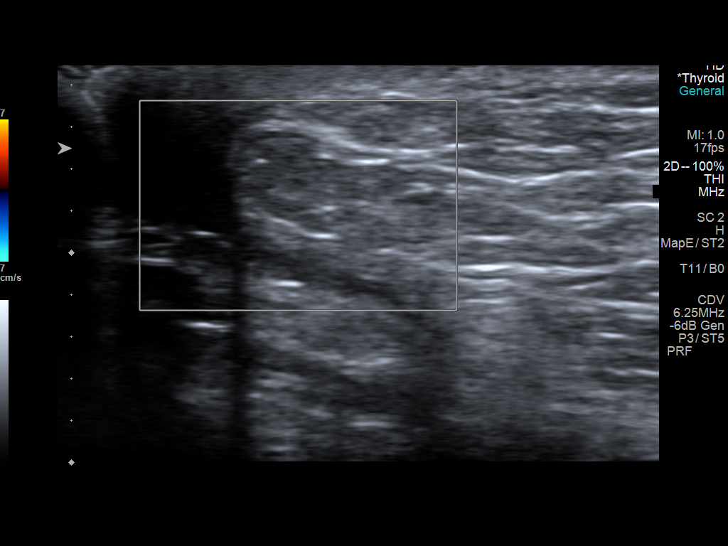
[im 21/21]
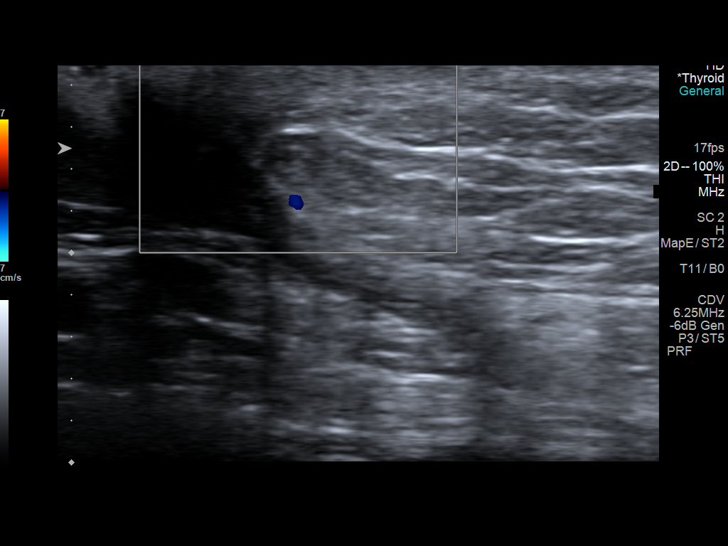

[14 of 21 positions shown; findings below may reference images not displayed]

FINDINGS: Targeted ultrasound examination at the patient identified site of
pain and tenderness at the inferior aspect of the umbilicus reveals
a 0.3 cm rounded, heterogeneous subcutaneous nodule.
IMPRESSION: Nonspecific 0.3 cm rounded, heterogeneous subcutaneous nodule at the
inferior aspect of the umbilicus, presumably infectious or
inflammatory in nature, possibly including a cutaneous inclusion
cyst.

## 2020-12-27 ENCOUNTER — Ambulatory Visit (INDEPENDENT_AMBULATORY_CARE_PROVIDER_SITE_OTHER): Payer: No Typology Code available for payment source | Admitting: Internal Medicine

## 2020-12-27 ENCOUNTER — Encounter: Payer: Self-pay | Admitting: Internal Medicine

## 2020-12-27 VITALS — BP 112/80 | HR 80 | Ht 66.0 in | Wt 118.8 lb

## 2020-12-27 DIAGNOSIS — N809 Endometriosis, unspecified: Secondary | ICD-10-CM

## 2020-12-27 DIAGNOSIS — R195 Other fecal abnormalities: Secondary | ICD-10-CM

## 2020-12-27 DIAGNOSIS — R140 Abdominal distension (gaseous): Secondary | ICD-10-CM | POA: Diagnosis not present

## 2020-12-27 DIAGNOSIS — E538 Deficiency of other specified B group vitamins: Secondary | ICD-10-CM | POA: Diagnosis not present

## 2020-12-27 DIAGNOSIS — Z8639 Personal history of other endocrine, nutritional and metabolic disease: Secondary | ICD-10-CM

## 2020-12-27 NOTE — Patient Instructions (Signed)
Continue oral b12 supplementation.  You have been scheduled for an endoscopy. Please follow written instructions given to you at your visit today. If you use inhalers (even only as needed), please bring them with you on the day of your procedure.  Your provider has requested that you go to the basement level for lab work any time between 8 am-5 pm Monday thru Friday the second week of September 2022.Press "B" on the elevator. The lab is located at the first door on the left as you exit the elevator.  If you are age 5 or younger, your body mass index should be between 19-25. Your Body mass index is 19.17 kg/m. If this is out of the aformentioned range listed, please consider follow up with your Primary Care Provider.   __________________________________________________________  The [facility] providers would like to encourage you to use [facility] to communicate with providers for non-urgent requests or questions.  Due to long hold times on the telephone, sending your provider a message by [facility] may be a faster and more efficient way to get a response.  Please allow 48 business hours for a response.  Please remember that this is for non-urgent requests.   Due to recent changes in healthcare laws, you may see the results of your imaging and laboratory studies on MyChart before your provider has had a chance to review them.  We understand that in some cases there may be results that are confusing or concerning to you. Not all laboratory results come back in the same time frame and the provider may be waiting for multiple results in order to interpret others.  Please give [place] 48 hours in order for your provider to thoroughly review all the results before contacting the office for clarification of your results.

## 2020-12-27 NOTE — Progress Notes (Signed)
Subjective:    Patient ID: Yvonne Garcia, female    DOB: 05-Sep-1985, 35 y.o.   MRN: [id number]  HPI Yvonne Garcia is a 35 year old female with a history of significant endometriosis with distal colonic involvement, B12 deficiency, history of IDA, family history of pancreatic cancer in her mother around age 79, hysterectomy/oophorectomy partial bowel resection in [facility] March 2022, recent spontaneous pneumothorax requiring hospitalization April 2022 who is here for follow-up.  She is here alone today.  She reports that her hysterectomy and bowel resection and appendectomy went well though she remained in the hospital for 6 days and required 2 units of packed red cells.  About 10 days after returning home she developed a spontaneous pneumothorax on the right, with a large left pleural effusion requiring 3 days hospital stay here.  This did not require chest tube though she was seen by pulmonary and cardiothoracic surgery.  She followed up with pulmonary and it was surmised that this was either secondary to mechanical ventilation at the time of her surgery or possibly endometriosis.  She reports that overall she is feeling fairly well.  Her appetite is very good but she is not able to gain weight.  She lost 5 pounds around her surgery and has lost 3 or 4 pounds since discharge from pneumothorax.  She does have abdominal bloating symptom.  Her bowel movements are variable and can be loose usually occurring several times per day.  Nonbloody.  None melenic.  Her diet consist mostly of fruits, vegetables, [place], eggs and chicken.  She stopped her oral B12 because she was hopeful that she would not need this long-term.  Primary care we did lab work and her B12 had fallen from around 600 to 159 as of October 28, 2020 after having been off of oral B12.  She is off of oral iron since March.  Her ferritin when checked in June was 11, percent saturation 27 and TIBC 316.  Her hemoglobin was also 11.8 in June.  Her GYN  doc in town is Dr. Renaldo Fiddler  Review of Systems As per HPI, otherwise negative  Current Medications, Allergies, Past Medical History, Past Surgical History, Family History and Social History were reviewed in [facility] electronic medical record.    Objective:   Physical Exam BP 112/80 (BP Location: Left Arm, Patient Position: Sitting, Cuff Size: Normal)   Pulse 80   Ht 5\' 6"  (1.676 m)   Wt 118 lb 12.8 oz (53.9 kg)   LMP 05/18/2020   SpO2 99%   BMI 19.17 kg/m  Gen: awake, alert, NAD HEENT: anicteric, op clear CV: RRR, no mrg Pulm: CTA b/l Abd: soft, NT/ND, +BS throughout Ext: no c/c/e Neuro: nonfocal  Labs -- see HPI, will scan labs from June 2022  Previous anti-intrinsic factor and antiparietal antibody negative    Assessment & Plan:  35 year old female with a history of significant endometriosis with distal colonic involvement, B12 deficiency, history of IDA, family history of pancreatic cancer in her mother around age 15, hysterectomy/oophorectomy partial bowel resection in [place] March 2022, recent spontaneous pneumothorax requiring hospitalization April 2022 who is here for follow-up.    Alternating bowel habits/bloating/B12 deficiency--some of her symptoms may be due to her recent bowel resection though with her B12 deficiency the question of autoimmune gastritis, celiac disease and SIBO are raised.  We previously did celiac antibodies and this was negative.  She does respond to oral B12 indicating she is able to absorb this vitamin.  We discussed  her symptoms at length today and I have recommended the following: --EGD; rule out gastritis, rule out celiac disease, rule out enteritis --If EGD negative then rifaximin 550 mg 3 times daily x14 days for IBS but also SIBO --Resume oral B12, she is already done this at least 1000 mcg daily --Repeat CBC CMP and B12 level in September; 3 months after last check  2.  Borderline iron deficiency --likely secondary to endometriosis,  she is now status post surgery --repeat ferritin and IBC panel in September  3.  Spontaneous pneumothorax --no recurrence since April, she is followed with pulmonary and been cleared.  Monitor for recurrence which hopefully will not happen  4.  Family history of pancreas cancer in mother age 53 --CT scan was done in January and April of this year.  Pancreas is normal.  Also of note there was no evidence of ileitis or bowel inflammation on either of those scans.  40 minutes total spent today including patient facing time, coordination of care, reviewing medical history/procedures/pertinent radiology studies, and documentation of the encounter.

## 2020-12-29 ENCOUNTER — Encounter: Payer: Self-pay | Admitting: Internal Medicine

## 2020-12-29 ENCOUNTER — Other Ambulatory Visit: Payer: Self-pay

## 2020-12-29 ENCOUNTER — Other Ambulatory Visit (INDEPENDENT_AMBULATORY_CARE_PROVIDER_SITE_OTHER): Payer: No Typology Code available for payment source

## 2020-12-29 ENCOUNTER — Ambulatory Visit (AMBULATORY_SURGERY_CENTER): Payer: No Typology Code available for payment source | Admitting: Internal Medicine

## 2020-12-29 VITALS — BP 95/57 | HR 61 | Temp 99.2°F | Resp 9 | Ht 66.0 in | Wt 118.0 lb

## 2020-12-29 DIAGNOSIS — E538 Deficiency of other specified B group vitamins: Secondary | ICD-10-CM

## 2020-12-29 DIAGNOSIS — R140 Abdominal distension (gaseous): Secondary | ICD-10-CM

## 2020-12-29 DIAGNOSIS — K319 Disease of stomach and duodenum, unspecified: Secondary | ICD-10-CM

## 2020-12-29 DIAGNOSIS — N809 Endometriosis, unspecified: Secondary | ICD-10-CM | POA: Diagnosis not present

## 2020-12-29 DIAGNOSIS — R195 Other fecal abnormalities: Secondary | ICD-10-CM

## 2020-12-29 LAB — IBC + FERRITIN
Ferritin: 17.2 ng/mL (ref 10.0–291.0)
Iron: 109 ug/dL (ref 42–145)
Saturation Ratios: 34.1 % (ref 20.0–50.0)
TIBC: 319.2 ug/dL (ref 250.0–450.0)
Transferrin: 228 mg/dL (ref 212.0–360.0)

## 2020-12-29 LAB — CBC WITH DIFFERENTIAL/PLATELET
Basophils Absolute: 0 10*3/uL (ref 0.0–0.1)
Basophils Relative: 0.6 % (ref 0.0–3.0)
Eosinophils Absolute: 0 10*3/uL (ref 0.0–0.7)
Eosinophils Relative: 1.3 % (ref 0.0–5.0)
HCT: 33.6 % — ABNORMAL LOW (ref 36.0–46.0)
Hemoglobin: 11.2 g/dL — ABNORMAL LOW (ref 12.0–15.0)
Lymphocytes Relative: 38.6 % (ref 12.0–46.0)
Lymphs Abs: 1.5 10*3/uL (ref 0.7–4.0)
MCHC: 33.3 g/dL (ref 30.0–36.0)
MCV: 85.1 fl (ref 78.0–100.0)
Monocytes Absolute: 0.2 10*3/uL (ref 0.1–1.0)
Monocytes Relative: 5.9 % (ref 3.0–12.0)
Neutro Abs: 2 10*3/uL (ref 1.4–7.7)
Neutrophils Relative %: 53.6 % (ref 43.0–77.0)
Platelets: 193 10*3/uL (ref 150.0–400.0)
RBC: 3.95 Mil/uL (ref 3.87–5.11)
RDW: 13.6 % (ref 11.5–15.5)
WBC: 3.8 10*3/uL — ABNORMAL LOW (ref 4.0–10.5)

## 2020-12-29 LAB — COMPREHENSIVE METABOLIC PANEL
ALT: 11 U/L (ref 0–35)
AST: 12 U/L (ref 0–37)
Albumin: 4.2 g/dL (ref 3.5–5.2)
Alkaline Phosphatase: 42 U/L (ref 39–117)
BUN: 9 mg/dL (ref 6–23)
CO2: 25 mEq/L (ref 19–32)
Calcium: 8.6 mg/dL (ref 8.4–10.5)
Chloride: 108 mEq/L (ref 96–112)
Creatinine, Ser: 0.61 mg/dL (ref 0.40–1.20)
GFR: 114.18 mL/min (ref >60.00)
Glucose, Bld: 77 mg/dL (ref 70–99)
Potassium: 3.7 mEq/L (ref 3.5–5.1)
Sodium: 140 mEq/L (ref 135–145)
Total Bilirubin: 1.9 mg/dL — ABNORMAL HIGH (ref 0.2–1.2)
Total Protein: 6.4 g/dL (ref 6.0–8.3)

## 2020-12-29 LAB — VITAMIN B12: Vitamin B-12: 376 pg/mL (ref 211–911)

## 2020-12-29 MED ORDER — SODIUM CHLORIDE 0.9 % IV SOLN: 500.0000 mL | Freq: Once | INTRAVENOUS | Status: DC

## 2020-12-29 NOTE — Progress Notes (Signed)
Pt in recovery with monitors in place, VSS. Report given to receiving RN. Bite guard was placed with pt awake to ensure comfort. No dental or soft tissue damage noted. 

## 2020-12-29 NOTE — Patient Instructions (Signed)
YOU HAD AN ENDOSCOPIC PROCEDURE TODAY AT THE [facility]:   Refer to the procedure report that was given to you for any specific questions about what was found during the examination.  If the procedure report does not answer your questions, please call your gastroenterologist to clarify.  If you requested that your care partner not be given the details of your procedure findings, then the procedure report has been included in a sealed envelope for you to review at your convenience later.  YOU SHOULD EXPECT: Some feelings of bloating in the abdomen. Passage of more gas than usual.  Walking can help get rid of the air that was put into your GI tract during the procedure and reduce the bloating. If you had a lower endoscopy (such as a colonoscopy or flexible sigmoidoscopy) you may notice spotting of blood in your stool or on the toilet paper. If you underwent a bowel prep for your procedure, you may not have a normal bowel movement for a few days.  Please Note:  You might notice some irritation and congestion in your nose or some drainage.  This is from the oxygen used during your procedure.  There is no need for concern and it should clear up in a day or so.  SYMPTOMS TO REPORT IMMEDIATELY:  Following upper endoscopy (EGD)  Vomiting of blood or coffee ground material  New chest pain or pain under the shoulder blades  Painful or persistently difficult swallowing  New shortness of breath  Fever of 100F or higher  Black, tarry-looking stools  For urgent or emergent issues, a gastroenterologist can be reached at any hour by calling [phone]. Do not use MyChart messaging for urgent concerns.    DIET:  We do recommend a small meal at first, but then you may proceed to your regular diet.  Drink plenty of fluids but you should avoid alcoholic beverages for 24 hours.  MEDICATIONS: Continue present medications.  We will take you to the laboratory prior to discharge to have blood  drawn, repeat CMP, today to ensure liver enzymes have normalized completely (there was a slight elevation during hospitalization in April 2022).  Please see handouts given to you by your recovery nurse.  Thank you for allowing [place] to provide for your healthcare needs today.  ACTIVITY:  You should plan to take it easy for the rest of today and you should NOT DRIVE or use heavy machinery until tomorrow (because of the sedation medicines used during the test).    FOLLOW UP: Our staff will call the number listed on your records 48-72 hours following your procedure to check on you and address any questions or concerns that you may have regarding the information given to you following your procedure. If we do not reach you, we will leave a message.  We will attempt to reach you two times.  During this call, we will ask if you have developed any symptoms of COVID 19. If you develop any symptoms (ie: fever, flu-like symptoms, shortness of breath, cough etc.) before then, please call [phone].  If you test positive for Covid 19 in the 2 weeks post procedure, please call and report this information to [place].    If any biopsies were taken you will be contacted by phone or by letter within the next 1-3 weeks.  Please call us at [phone] if you have not heard about the biopsies in 3 weeks.    SIGNATURES/CONFIDENTIALITY: You and/or your care partner have signed  paperwork which will be entered into your electronic medical record.  These signatures attest to the fact that that the information above on your After Visit Summary has been reviewed and is understood.  Full responsibility of the confidentiality of this discharge information lies with you and/or your care-partner.

## 2020-12-29 NOTE — Progress Notes (Signed)
Called to room to assist during endoscopic procedure.  Patient ID and intended procedure confirmed with present staff. Received instructions for my participation in the procedure from the performing physician.  

## 2020-12-29 NOTE — Op Note (Signed)
[facility] Patient Name: Yvonne Garcia Procedure Date: 12/29/2020 7:28 AM MRN: [id number] Endoscopist: Beverley Fiedler , MD Age: 35 Referring MD:  Date of Birth: 1985-10-12 Gender: Female Account #: [id number] Procedure:                Upper GI endoscopy Indications:              Abdominal bloating, B12 deficiency, alternating                            bowel habits, borderline iron deficiency Medicines:                Monitored Anesthesia Care Procedure:                Pre-Anesthesia Assessment:                           - Prior to the procedure, a History and Physical                            was performed, and patient medications and                            allergies were reviewed. The patient's tolerance of                            previous anesthesia was also reviewed. The risks                            and benefits of the procedure and the sedation                            options and risks were discussed with the patient.                            All questions were answered, and informed consent                            was obtained. Prior Anticoagulants: The patient has                            taken no previous anticoagulant or antiplatelet                            agents. ASA Grade Assessment: II - A patient with                            mild systemic disease. After reviewing the risks                            and benefits, the patient was deemed in                            satisfactory condition to undergo the procedure.  After obtaining informed consent, the endoscope was                            passed under direct vision. Throughout the                            procedure, the patient's blood pressure, pulse, and                            oxygen saturations were monitored continuously. The                            Endoscope was introduced through the mouth, and                            advanced to the second  part of duodenum. The upper                            GI endoscopy was accomplished without difficulty.                            The patient tolerated the procedure well. Scope In: Scope Out: Findings:                 The examined esophagus was normal.                           Localized mildly erythematous mucosa was found in                            the prepyloric region of the stomach. Biopsies were                            taken with a cold forceps for histology and                            Helicobacter pylori testing (gastric body, antrum                            and incisura).                           The exam of the stomach was otherwise normal.                           The examined duodenum was normal. Biopsies for                            histology were taken with a cold forceps for                            evaluation of celiac disease. Complications:            No immediate complications. Estimated Blood Loss:     Estimated blood loss was minimal. Impression:               -  Normal esophagus.                           - Erythematous (mild) mucosa in the prepyloric                            region of the stomach. Biopsied.                           - Stomach otherwise normal. No evidence of atrophic                            mucosa.                           - Normal examined duodenum. Biopsied. Recommendation:           - Patient has a contact number available for                            emergencies. The signs and symptoms of potential                            delayed complications were discussed with the                            patient. Return to normal activities tomorrow.                            Written discharge instructions were provided to the                            patient.                           - Resume previous diet.                           - Continue present medications.                           - Await pathology results.                            - Repeat CMP today to ensure liver enzymes have                            normalized completely (slight elevation during                            hospitalization in April 2022).                           - Rifaximin treatment if biopsies unrevealing. Beverley Fiedler, MD 12/29/2020 8:26:32 AM This report has been signed electronically.

## 2020-12-30 ENCOUNTER — Other Ambulatory Visit: Payer: Self-pay

## 2020-12-30 DIAGNOSIS — R7989 Other specified abnormal findings of blood chemistry: Secondary | ICD-10-CM

## 2021-01-02 ENCOUNTER — Telehealth: Payer: Self-pay

## 2021-01-02 NOTE — Telephone Encounter (Signed)
  Follow up Call-  Call back number 12/29/2020 10/26/2019  Post procedure Call Back phone  # [phone]  Permission to leave phone message Yes Yes  Some recent data might be hidden     Patient questions:  Do you have a fever, pain , or abdominal swelling? No. Pain Score  0 *  Have you tolerated food without any problems? Yes.    Have you been able to return to your normal activities? Yes.    Do you have any questions about your discharge instructions: Diet   No. Medications  No. Follow up visit  No.  Do you have questions or concerns about your Care? No.  Actions: * If pain score is 4 or above: No action needed, pain <4. Have you developed a fever since your procedure? no  2.   Have you had an respiratory symptoms (SOB or cough) since your procedure? no  3.   Have you tested positive for COVID 19 since your procedure no  4.   Have you had any family members/close contacts diagnosed with the COVID 19 since your procedure?  no   If yes to any of these questions please route to Laverna Peace, RN and Karlton Lemon, RN

## 2021-01-02 NOTE — Telephone Encounter (Signed)
No answer, left message to call back later today, B.Yolande Skoda RN. 

## 2021-01-05 ENCOUNTER — Encounter: Payer: Self-pay | Admitting: Internal Medicine

## 2021-01-06 ENCOUNTER — Ambulatory Visit: Payer: No Typology Code available for payment source | Admitting: Endocrinology

## 2021-01-16 ENCOUNTER — Other Ambulatory Visit (INDEPENDENT_AMBULATORY_CARE_PROVIDER_SITE_OTHER): Payer: No Typology Code available for payment source

## 2021-01-16 DIAGNOSIS — R7989 Other specified abnormal findings of blood chemistry: Secondary | ICD-10-CM

## 2021-01-16 LAB — HEPATIC FUNCTION PANEL
ALT: 7 U/L (ref 0–35)
AST: 12 U/L (ref 0–37)
Albumin: 4.5 g/dL (ref 3.5–5.2)
Alkaline Phosphatase: 44 U/L (ref 39–117)
Bilirubin, Direct: 0.3 mg/dL (ref 0.0–0.3)
Total Bilirubin: 2 mg/dL — ABNORMAL HIGH (ref 0.2–1.2)
Total Protein: 7.1 g/dL (ref 6.0–8.3)

## 2021-01-19 ENCOUNTER — Telehealth: Payer: Self-pay

## 2021-01-19 ENCOUNTER — Other Ambulatory Visit: Payer: Self-pay

## 2021-01-19 MED ORDER — RIFAXIMIN 550 MG PO TABS: 550.0000 mg | ORAL_TABLET | Freq: Three times a day (TID) | ORAL | 0 refills | Status: DC

## 2021-01-19 NOTE — Telephone Encounter (Signed)
Script sent to the pharmacy. 

## 2021-01-19 NOTE — Telephone Encounter (Signed)
-----   Message from Beverley Fiedler, MD sent at 01/19/2021  1:14 PM EDT ----- I sent patient a MyChart message with details  Please prescribe rifaximin 550 mg 3 times daily x14 days for IBS-D Thanks JMP

## 2021-02-16 ENCOUNTER — Other Ambulatory Visit: Payer: Self-pay | Admitting: Family Medicine

## 2021-02-16 DIAGNOSIS — Z8249 Family history of ischemic heart disease and other diseases of the circulatory system: Secondary | ICD-10-CM

## 2021-02-20 ENCOUNTER — Ambulatory Visit (INDEPENDENT_AMBULATORY_CARE_PROVIDER_SITE_OTHER): Payer: No Typology Code available for payment source | Admitting: Endocrinology

## 2021-02-20 ENCOUNTER — Encounter: Payer: Self-pay | Admitting: Endocrinology

## 2021-02-20 ENCOUNTER — Other Ambulatory Visit: Payer: Self-pay

## 2021-02-20 DIAGNOSIS — E538 Deficiency of other specified B group vitamins: Secondary | ICD-10-CM | POA: Diagnosis not present

## 2021-02-20 DIAGNOSIS — R634 Abnormal weight loss: Secondary | ICD-10-CM

## 2021-02-20 LAB — CORTISOL
Cortisol, Plasma: 13.2 ug/dL
Cortisol, Plasma: 22.9 ug/dL

## 2021-02-20 LAB — VITAMIN B12: Vitamin B-12: 425 pg/mL (ref 211–911)

## 2021-02-20 MED ORDER — HYDROCORTISONE ACETATE 25 MG RE SUPP: 25.0000 mg | Freq: Every day | RECTAL | 0 refills | Status: DC

## 2021-02-20 MED ORDER — COSYNTROPIN 0.25 MG IJ SOLR
0.2500 mg | Freq: Once | INTRAMUSCULAR | Status: AC
  Administered 2021-02-20: 0.25 mg via INTRAVENOUS

## 2021-02-20 NOTE — Telephone Encounter (Signed)
Dottie, Please let the patient know that often it is 2 to 3 weeks after antibiotics complete before people see the benefits of rifaximin therapy. Lets try her on hydrocortisone suppository 25 mg nightly x3-5 nights for symptomatic hemorrhoids We can also get her a follow-up, nonurgent, with me and we can discuss all symptoms including hemorrhoids at that time

## 2021-02-20 NOTE — Progress Notes (Signed)
Subjective:    Patient ID: Yvonne Garcia, female    DOB: [birth year], 35 y.o.   MRN: [id number]  HPI Pt is ref by Dr Wynelle Link, for weight loss.  She reports few years of 25 lbs weight loss--unintentional, despite excessive appetite.   She also reports postprandial anxiety and HA.  She has checked cbg then (80-100).  Sxs do not resolve with PO intake.  No recent steroid rx.  no h/o abdominal or brain injury.  No h/o cancer, thyroid problems, seizures, amyloidosis, tuberculosis, or diabetes.  No h/o ketoconazole, rifampin, or dilantin.  Since she started supplements of Vits D and B-12, no improvement in sxs.   Past Medical History:  Diagnosis Date   Allergies    Anxiety    Endometriosis    of rectosigmoid junction (removed surgically)   Family history of breast cancer 05/05/2020   Family history of colon cancer 05/05/2020   Family history of kidney cancer 05/05/2020   Family history of pancreatic cancer 05/05/2020   Internal hemorrhoids    Pneumothorax     Past Surgical History:  Procedure Laterality Date   ABDOMINAL HYSTERECTOMY     APPENDECTOMY     LAPAROSCOPIC BOWEL RESECTION     low anterior resection  08/09/2020   for endometriosis of rectosigmoid junction   TONSILLECTOMY     WISDOM TOOTH EXTRACTION      Social History   Socioeconomic History   Marital status: Married    Spouse name: Not on file   Number of children: Not on file   Years of education: Not on file   Highest education level: Not on file  Occupational History   Not on file  Tobacco Use   Smoking status: Never    Passive exposure: Never   Smokeless tobacco: Never  Vaping Use   Vaping Use: Never used  Substance and Sexual Activity   Alcohol use: Yes    Comment: rare   Drug use: Never   Sexual activity: Not on file  Other Topics Concern   Not on file  Social History Narrative   Not on file   Social Determinants of Health   Financial Resource Strain: Not on file  Food Insecurity: Not on file   Transportation Needs: Not on file  Physical Activity: Not on file  Stress: Not on file  Social Connections: Not on file  Intimate Partner Violence: Not on file    Current Outpatient Medications on File Prior to Visit  Medication Sig Dispense Refill   acetaminophen (TYLENOL) 500 MG tablet Take 1,000 mg by mouth every 6 (six) hours as needed for moderate pain or headache. (Patient not taking: Reported on 12/29/2020)     cholecalciferol (VITAMIN D3) 25 MCG (1000 UNIT) tablet Take 1,000 Units by mouth daily.     clonazePAM (KLONOPIN) 0.5 MG tablet Take 1 tablet by mouth daily as needed for anxiety.     docusate sodium (COLACE) 100 MG capsule Take 100 mg by mouth daily. (Patient not taking: Reported on 12/29/2020)     fluticasone (FLONASE) 50 MCG/ACT nasal spray Place 1 spray into both nostrils daily as needed for allergies.     ibuprofen (ADVIL) 200 MG tablet Take 200 mg by mouth every 6 (six) hours as needed.     rifaximin (XIFAXAN) 550 MG TABS tablet Take 1 tablet (550 mg total) by mouth 3 (three) times daily. 42 tablet 0   Simethicone (GAS-X PO) Take 1 tablet by mouth daily as needed (gas/bloating).  vitamin B-12 (CYANOCOBALAMIN) 1000 MCG tablet Take 1,000 mcg by mouth daily.     Current Facility-Administered Medications on File Prior to Visit  Medication Dose Route Frequency Provider Last Rate Last Admin   0.9 %  sodium chloride infusion  500 mL Intravenous Once Pyrtle, Carie Caddy, MD        No Known Allergies  Family History  Problem Relation Age of Onset   Mental illness Mother        suicide.    Pancreatic cancer Mother 39   Breast cancer Mother 70   Kidney cancer Father 34   Colon cancer Maternal Grandfather        dx 61s   Kidney cancer Paternal Grandfather        dx unknown age    BP 100/68 (BP Location: Right Arm, Patient Position: Sitting, Cuff Size: Normal)   Pulse 88   Ht 5\' 6"  (1.676 m)   Wt 119 lb 6.4 oz (54.2 kg)   LMP 05/18/2020   SpO2 95%   BMI 19.27 kg/m    Review of Systems Denies n/v and change in skin tone.  She has fatigue, and cold intolerance.      Objective:   Physical Exam VS: see vs page GEN: no distress HEAD: head: no deformity eyes: no periorbital swelling, no proptosis external nose and ears are normal NECK: supple, thyroid is not enlarged CHEST WALL: no deformity LUNGS: clear to auscultation CV: reg rate and rhythm, no murmur.  MUSCULOSKELETAL: gait is normal and steady EXTEMITIES: no deformity.  no leg edema NEURO:  readily moves all 4's.  sensation is intact to touch on all 4's SKIN:  Normal texture and temperature.  No rash or suspicious lesion is visible. No vitiligo  NODES:  None palpable at the neck PSYCH: alert, well-oriented.  Does not appear anxious nor depressed.     Lab Results  Component Value Date   TSH 2.513 08/21/2020    I have reviewed outside records, and summarized: Pt was noted to have weight loss, and referred here.  She was admitted early 2022 with pl eff and pneumothorax.    CT (2022) adrenals and pancreas are normal  ACTH stimulation test is done: baseline cortisol level=13 then Cosyntropin 250 mcg is given im 45 minutes later, cortisol level=23 (normal response)     Assessment & Plan:  Weight loss.  Adrenal insuff is excluded.  We discussed.  Her illness in early 2022 has prob contributed.  Please continue the same Vit-D and B-12 Postprandial sxs.  She does not have hypoglycemia, but I am fine with her continuing to check.

## 2021-02-20 NOTE — Patient Instructions (Signed)
Blood tests are requested for you today.  We'll let you know about the results.  

## 2021-02-24 ENCOUNTER — Other Ambulatory Visit: Payer: Self-pay

## 2021-02-24 ENCOUNTER — Ambulatory Visit
Admission: RE | Admit: 2021-02-24 | Discharge: 2021-02-24 | Disposition: A | Discharge status: Home / Self Care | Payer: No Typology Code available for payment source | Source: Ambulatory Visit | Attending: Family Medicine | Admitting: Family Medicine

## 2021-02-24 DIAGNOSIS — Z8249 Family history of ischemic heart disease and other diseases of the circulatory system: Secondary | ICD-10-CM

## 2021-02-24 IMAGING — MR MR MRA HEAD W/O CM
1 series · 23 of 48 positions shown · non-contrast
Comparison: No pertinent prior exam.

CLINICAL DATA: Family history of cerebral aneurysm

EXAM:
MRA HEAD WITHOUT CONTRAST
TECHNIQUE: Angiographic images of the Circle of Willis were acquired using MRA
technique without intravenous contrast.

[Series 3: tof_3d_multi-slab · axial · 0.7mm · 0.35mm/px · z∈[-54,+40]mm · 23 of 143 slices shown]
[im 1/143]
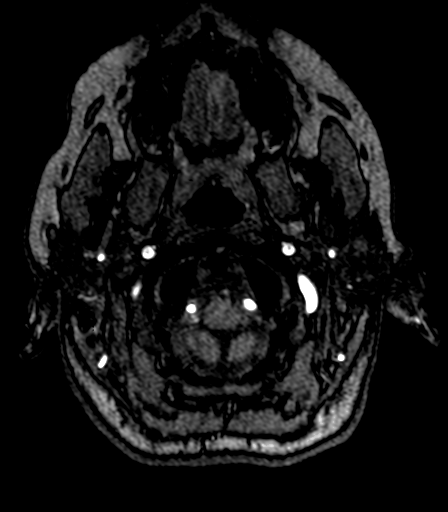
[im 4/143]
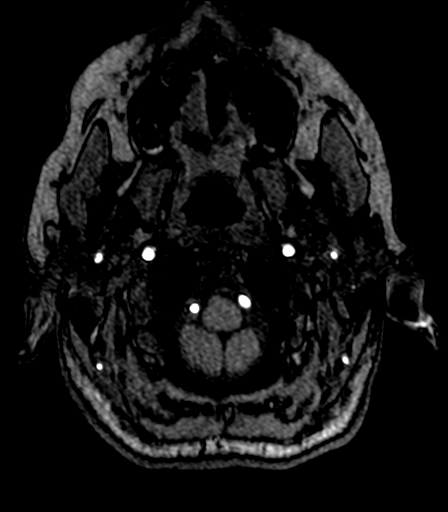
[im 7/143]
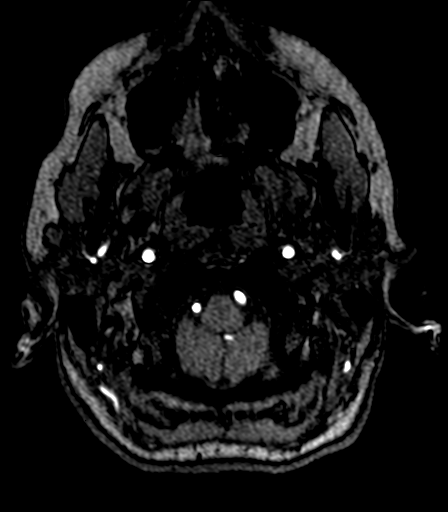
[im 10/143]
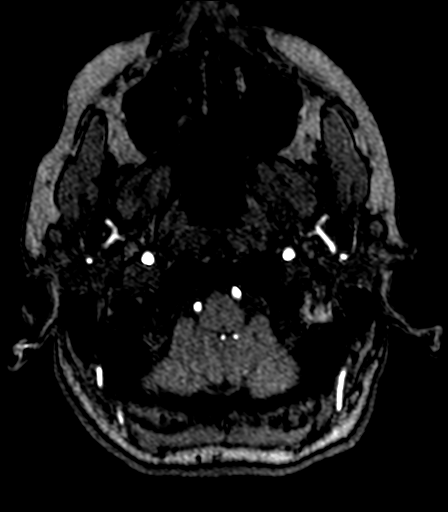
[im 13/143]
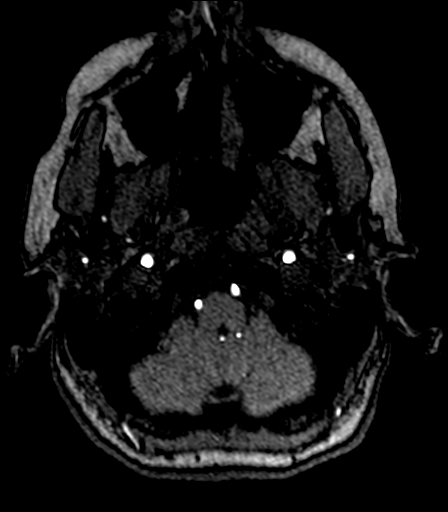
[im 16/143]
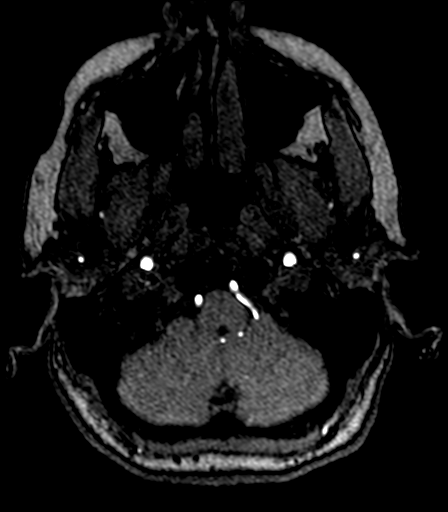
[im 19/143]
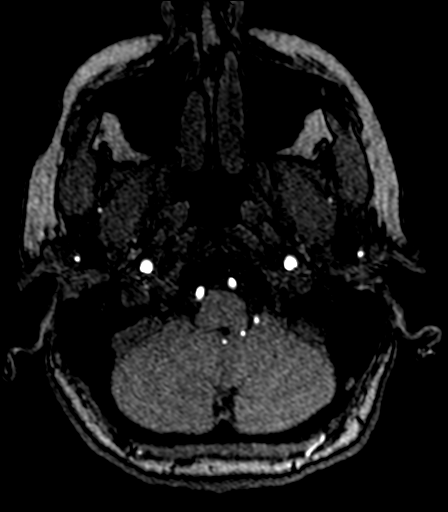
[im 22/143]
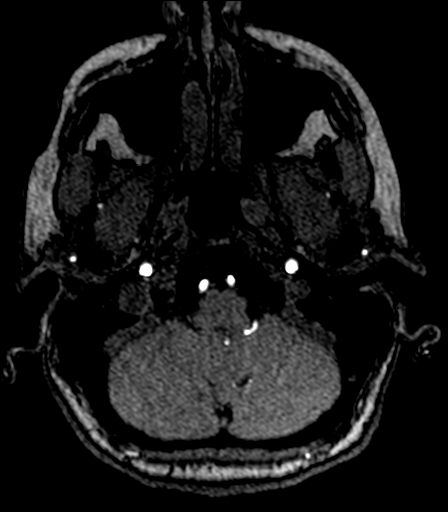
[im 25/143]
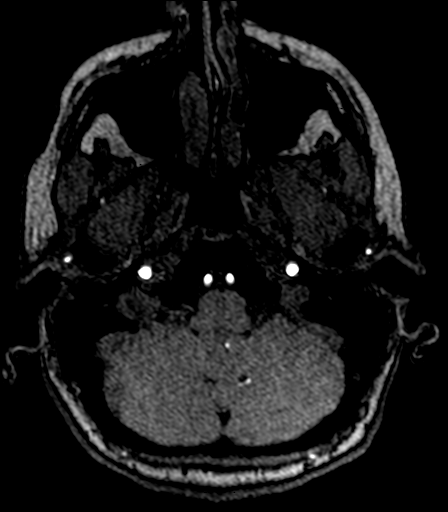
[im 28/143]
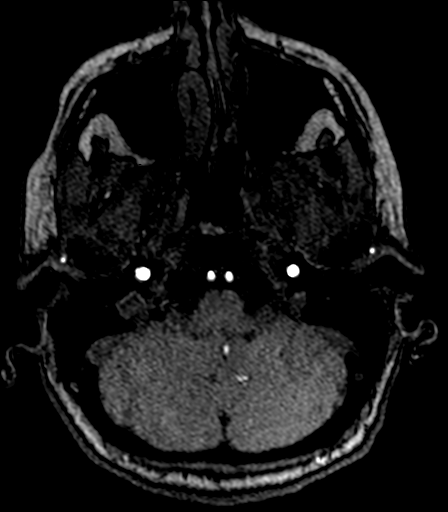
[im 31/143]
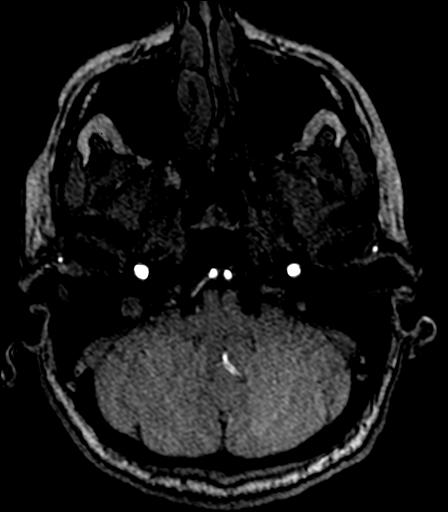
[im 34/143]
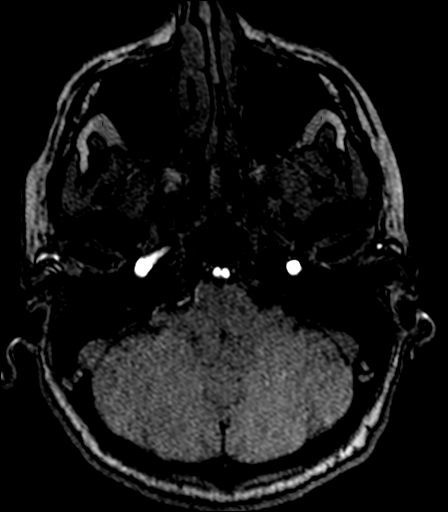
[im 37/143]
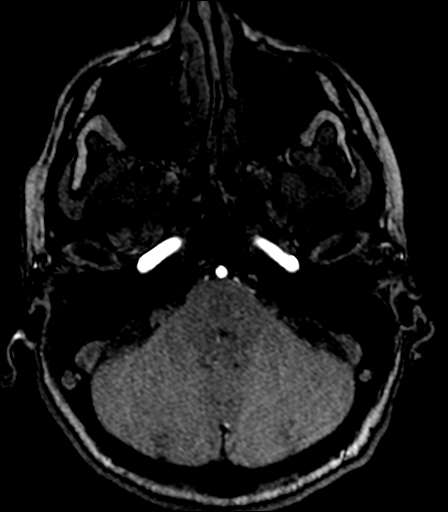
[im 40/143]
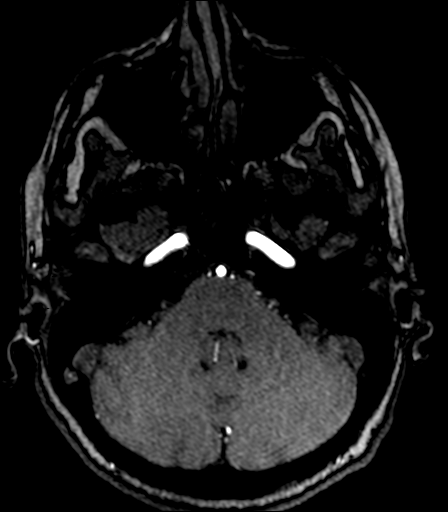
[im 43/143]
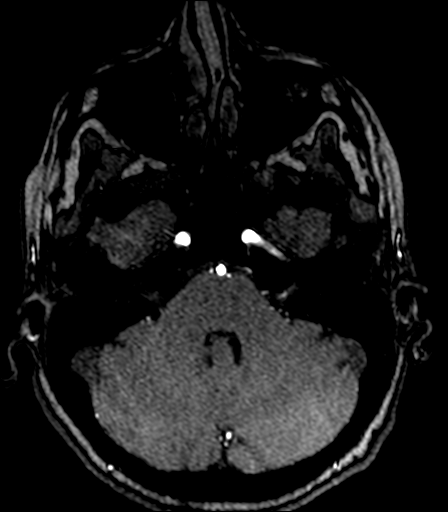
[im 46/143]
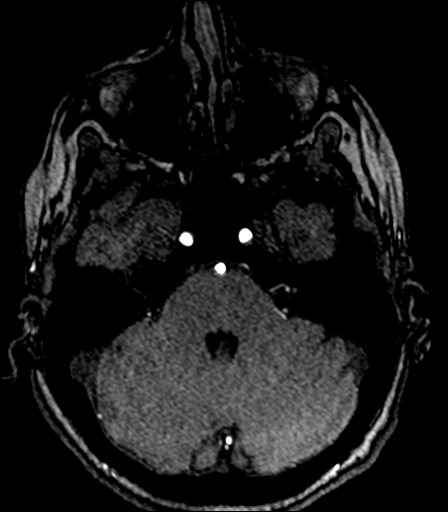
[im 64/143]
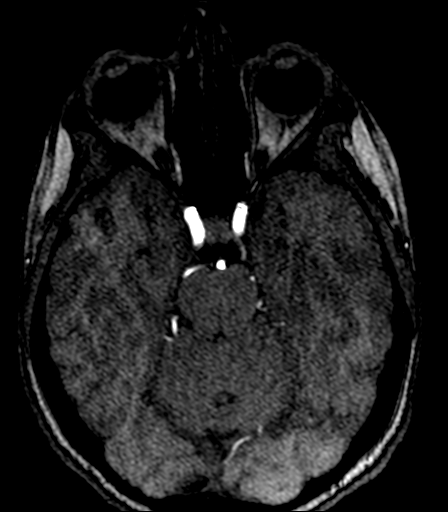
[im 73/143]
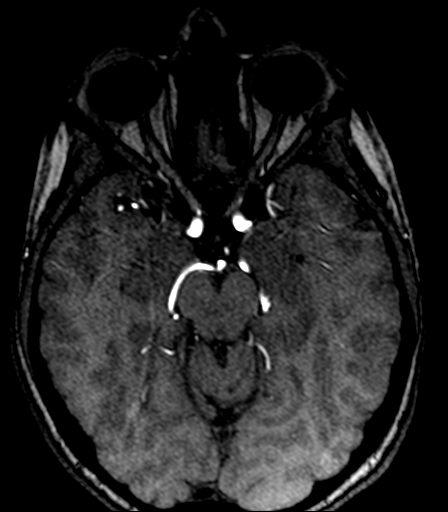
[im 82/143]
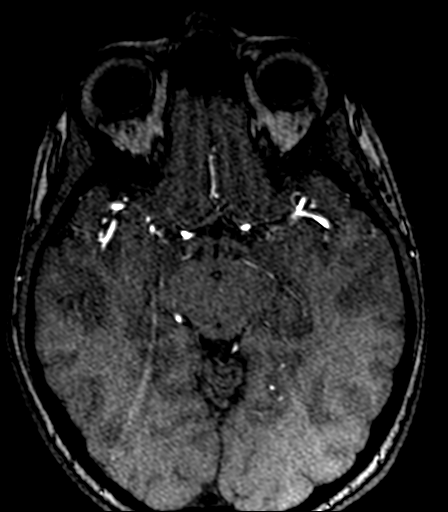
[im 100/143]
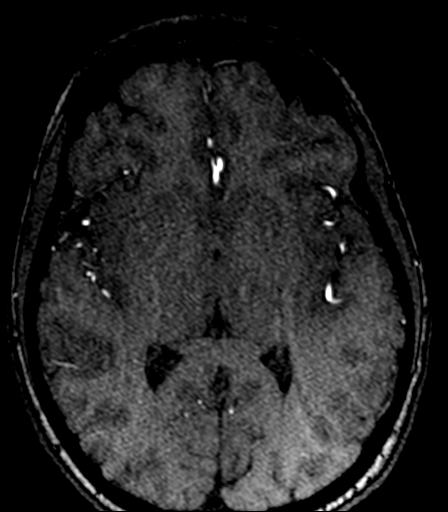
[im 118/143]
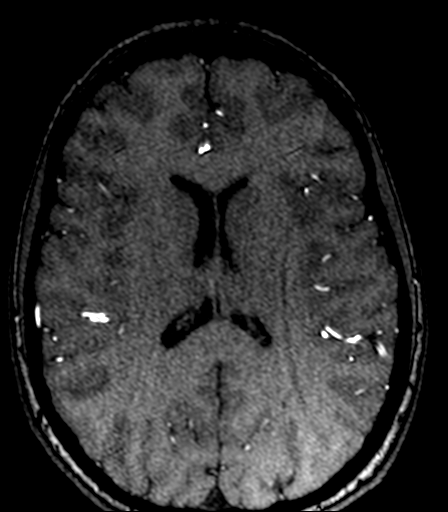
[im 121/143]
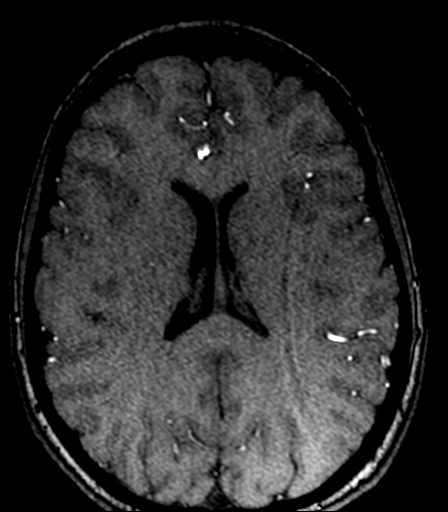
[im 136/143]
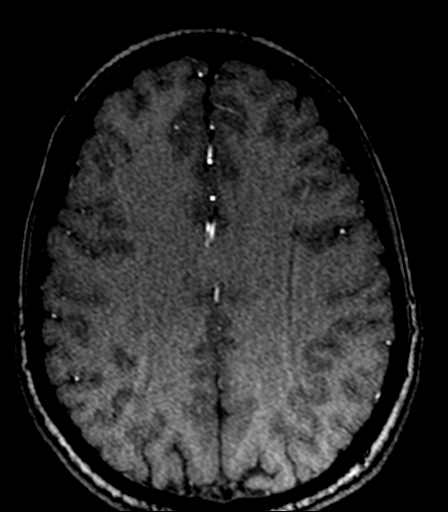

[23 of 48 positions shown; findings below may reference images not displayed]

FINDINGS: Anterior circulation: Normal anterior circulation. Negative for
stenosis or occlusion. Negative for aneurysm or vascular
malformation

Posterior circulation: Normal posterior circulation. Negative for
stenosis or occlusion. Negative for vascular malformation or
aneurysm.

Anatomic variants: None

Other: None
IMPRESSION: Normal MRA head.  Negative for cerebral aneurysm.

## 2021-03-06 ENCOUNTER — Encounter: Payer: Self-pay | Admitting: Internal Medicine

## 2021-03-06 ENCOUNTER — Ambulatory Visit (INDEPENDENT_AMBULATORY_CARE_PROVIDER_SITE_OTHER): Payer: No Typology Code available for payment source | Admitting: Internal Medicine

## 2021-03-06 ENCOUNTER — Other Ambulatory Visit: Payer: Self-pay

## 2021-03-06 VITALS — BP 102/60 | HR 73 | Temp 98.2°F | Ht 66.0 in | Wt 117.8 lb

## 2021-03-06 DIAGNOSIS — J9383 Other pneumothorax: Secondary | ICD-10-CM | POA: Diagnosis not present

## 2021-03-06 NOTE — Progress Notes (Signed)
Yvonne Garcia    [id number]    [birth year]  Primary Care Physician:Sun, [name], MD Date of Appointment: 03/06/2021 Established Patient Visit  Chief complaint:   Chief Complaint  Patient presents with   Follow-up    Empyema  Thoracentesis 4.3.22 CXR 4.19.22      HPI: Yvonne Garcia is a 35 y.o. woman with history of endometriosis s/p total laparoscopic hysterectomy and anemia who presents for follow up after having a spontaneous pneumothorax with empyema. Had thoracentesis in IR which showed empyema. Had repeat thoracentesis with Dr. Merrily Pew which showed an additional 600 cc of blood fluid and was treated with IV abx and discharged on omnicef. Upon obtaining further history, Yvonne Garcia has has had longstanding endometriosis for many years with debilitating pain and had significant extrauterine manifestations.  She had laparoscopic hysterectomy and lysis of adhesions which is over 3 hours long.  Surgery was march 22nd 2022.  Following the surgery she had extensive subcutaneous emphysema in her back, arms, neck.  At that time she did not have a pneumothorax.  Unfortunately his records not available for review.  The surgery was done in [place] at [facility] with Dr. Tonia Brooms who is a endometriosis expert.  Interval Updates:  Here for follow up from pneumothorax. No further episodes of sudden onset chest pain. She has resumed normal activity level. Has occasional twinge of discomfort on the left side. Not reproducible by touch or by movement. Lasts seconds, resolves spontaneously. Has had some return of her endometrosis symptoms since surgery earlier this year. No cough ,wheezing, dyspnea.    I have reviewed the patient's family social and past medical history and updated as appropriate.   Past Medical History:  Diagnosis Date   Allergies    Anxiety    Endometriosis    of rectosigmoid junction (removed surgically)   Family history of breast cancer 05/05/2020   Family  history of colon cancer 05/05/2020   Family history of kidney cancer 05/05/2020   Family history of pancreatic cancer 05/05/2020   Internal hemorrhoids    Pneumothorax     Past Surgical History:  Procedure Laterality Date   ABDOMINAL HYSTERECTOMY     APPENDECTOMY     LAPAROSCOPIC BOWEL RESECTION     low anterior resection  08/09/2020   for endometriosis of rectosigmoid junction   TONSILLECTOMY     WISDOM TOOTH EXTRACTION      Family History  Problem Relation Age of Onset   Mental illness Mother        suicide.    Pancreatic cancer Mother 55   Breast cancer Mother 34   Kidney cancer Father 28   Colon cancer Maternal Grandfather        dx 51s   Kidney cancer Paternal Grandfather        dx unknown age    Social History   Occupational History   Not on file  Tobacco Use   Smoking status: Never    Passive exposure: Never   Smokeless tobacco: Never  Vaping Use   Vaping Use: Never used  Substance and Sexual Activity   Alcohol use: Yes    Comment: rare   Drug use: Never   Sexual activity: Not on file     Physical Exam: Blood pressure 102/60, pulse 73, temperature 98.2 F (36.8 C), temperature source Oral, height 5\' 6"  (1.676 m), weight 117 lb 12.8 oz (53.4 kg), last menstrual period 05/18/2020, SpO2 99 %.  Gen:  No acute distress Lungs:    ctab, no wheezes or crackles, no increased work of breathing, no chest pain on palpation of left thorax.  CV:        RRR no mrg   Data Reviewed: Imaging: I have personally reviewed the CT Angio from April 2022 as well as chest xrays subsequently which show resolving pleural effusion on the left side. Today's xray shows near complete resolution of left sided pleural effusion. There is improvement in the right apical ptx CT Chest does not show significant cysts or blebs.   PFTs:   Labs: Gram stain from pleural fluid shows WBCs, rare GPC in chains, few gram variable rods Plus fluid studies and chemistries show exudative  effusion  Immunization status: Immunization History  Administered Date(s) Administered   HPV Quadrivalent 12/29/2007   PFIZER(Purple Top)SARS-COV-2 Vaccination 08/09/2019, 08/30/2019, 04/22/2020    Assessment:  Pneumothorax, spontaneous with pleural effusion.  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   Plan/Recommendations: Improved symptoms, no recurrence. She will monitor her endometriosis symptoms and will call us sooner if the discomfort in her left chest worsens. I will see her back in a year.   There is no cure for endometriosis and although she is at hysterectomy she can still very well continue to have recurrence of catamenial effusions since she does have ovulatory cycles.  Unfortunately there is not much we can do for catamenial pneumothorax unless there is recurrence.  She has not been tolerant of hormonal suppression therapy and should she have recurrence she should be referred to thoracic surgery for evaluation of pleurodesis.   Return to Care: Return in about 1 year (around 03/06/2022).   Durel Salts, MD Pulmonary and Critical Care Medicine [facility]:[phone]

## 2021-03-06 NOTE — Patient Instructions (Addendum)
Please schedule follow up scheduled with myself in 12 months.  If my schedule is not open yet, we will contact you with a reminder closer to that time.  Call me sooner if you are having symptoms of chest pain or discomfort. If you have sudden onset pain with trouble breathing, this could be a pneumothorax. Please go to the ED.  You can try NSAIDS or topical sports rubs (ben gay etc) for the pain. Lidocaine patches can also be helpful.

## 2021-04-26 ENCOUNTER — Encounter: Payer: Self-pay | Admitting: Internal Medicine

## 2021-04-26 ENCOUNTER — Ambulatory Visit (INDEPENDENT_AMBULATORY_CARE_PROVIDER_SITE_OTHER): Payer: No Typology Code available for payment source | Admitting: Internal Medicine

## 2021-04-26 VITALS — BP 106/58 | HR 62 | Ht 66.0 in | Wt 118.0 lb

## 2021-04-26 DIAGNOSIS — K648 Other hemorrhoids: Secondary | ICD-10-CM | POA: Diagnosis not present

## 2021-04-26 DIAGNOSIS — E538 Deficiency of other specified B group vitamins: Secondary | ICD-10-CM

## 2021-04-26 DIAGNOSIS — K589 Irritable bowel syndrome without diarrhea: Secondary | ICD-10-CM

## 2021-04-26 DIAGNOSIS — Z8742 Personal history of other diseases of the female genital tract: Secondary | ICD-10-CM

## 2021-04-26 DIAGNOSIS — N809 Endometriosis, unspecified: Secondary | ICD-10-CM

## 2021-04-26 MED ORDER — HYDROCORTISONE ACETATE 25 MG RE SUPP: 25.0000 mg | Freq: Every evening | RECTAL | 0 refills | Status: AC

## 2021-04-26 MED ORDER — DIPHENOXYLATE-ATROPINE 2.5-0.025 MG PO TABS: 1.0000 | ORAL_TABLET | Freq: Three times a day (TID) | ORAL | 1 refills | Status: DC | PRN

## 2021-04-26 NOTE — Progress Notes (Signed)
Subjective:    Patient ID: Yvonne Garcia, female    DOB: [birth year], 35 y.o.   MRN: [id number]  HPI Yvonne Garcia is a 35 year old female with a history of significant endometriosis with distal colonic involvement requiring hysterectomy/oophorectomy with partial bowel resection in March 2022, history of B12 deficiency, history of IDA, family history of pancreatic cancer in her mother, spontaneous pneumothorax in April 2022 who is here for follow-up.  She is here alone today.  I last saw her at the time of her upper endoscopy on 12/29/2020.  EGD was performed to evaluate abdominal bloating, borderline iron deficiency and B12 deficiency.  This was normal with the exception of mildly erythematous mucosa in the prepyloric stomach.  This was biopsied.  No evidence of atrophic gastritis.  Pathology showed mild nonspecific reactive gastropathy.  There is no H. pylori.  Duodenal biopsies were normal.  She reports that on the whole she has been feeling well though she does still have some abdominal symptoms which she wonders if these are cyclical and related to when she would normally be menstruating.  Obviously she is passing no vaginal blood after her hysterectomy.  She does notice for 4 to 5 days about once per month she will have symptoms reminiscent though certainly not as severe as prior symptoms.  This includes looser more frequent bowel movements with lower abdominal tightness.  Occasional rectal discomfort.  Stress worsens the symptoms as well.  She is also occasionally having internal hemorrhoidal symptoms with standing or heavy lifting and during the parts of the month where her stools are more frequent.  She was treated with rifaximin and this did help the bloating.  She reports her anxiety while still present is mostly controlled.  She has a prescription for Klonopin but does not really need to use it.  Just having the prescription provide some peace for her.  She continues B12 by mouth.  Review  of Systems As per HPI, otherwise negative  Current Medications, Allergies, Past Medical History, Past Surgical History, Family History and Social History were reviewed in [employer] record.    Objective:   Physical Exam BP (!) 106/58   Pulse 62   Ht 5\' 6"  (1.676 m)   Wt 118 lb (53.5 kg)   LMP 05/18/2020   BMI 19.05 kg/m  Gen: awake, alert, NAD HEENT: anicteric Neuro: nonfocal  Lab Results  Component Value Date   VITAMINB12 425 02/20/2021       Assessment & Plan:  35 year old female with a history of significant endometriosis with distal colonic involvement requiring hysterectomy/oophorectomy with partial bowel resection in March 2022, history of B12 deficiency, history of IDA, family history of pancreatic cancer in her mother, spontaneous pneumothorax in April 2022 who is here for follow-up.    B12 deficiency --not found to have pernicious anemia or atrophic gastritis.  She is responding to oral therapy.  B12 was 425 in October --Continue B12 1000 mcg daily by mouth  2.  Alternating bowel habits/bloating/irritable bowel/history of endometriosis --she certainly had endometriosis involving the bowel and this required a partial bowel resection.  It is certainly possible that while her endometriosis is better that there is some cyclical hormonal nature to her GI symptoms.  This can certainly be present in endometriosis but also irritable bowel.  We spent time discussing this today.  I do think she has a component of IBS.  We discussed the following: --She would like to try FODMAP diet which I recommended she  try for 4 to 8 weeks.  We discussed how this would not be a long-term diet as it can lead to some nutrient deficiencies if used long-term --We discussed possibly adding buspirone which could help with IBS but also anxiety which can overlap with IBS --We also discussed the possibility of residual endometriosis involving the gut particularly given her history  and the cyclical nature to the symptoms.  For now we will observe and she will follow-up with GYN. --Her stools tend to be loose during her menstrual time even though she is not menstruating.  Prescription for Lomotil 1 tablet 3 times daily on an as-needed basis only for loose stools.  3.  Symptomatic internal hemorrhoids --hydrocortisone suppository 25 mg nightly x3 nights on an as-needed basis.  She prefers to follow-up as needed  30 minutes total spent today including patient facing time, coordination of care, reviewing medical history/procedures/pertinent radiology studies, and documentation of the encounter.

## 2021-04-26 NOTE — Patient Instructions (Addendum)
Please follow up as needed.   We have sent the following medications to your pharmacy for you to pick up at your convenience: Lomotil three times daily as needed Hydrocodone suppository- 25 mg once per night x 3 nights  Continue B12   We have given you a FODMAP diet to look over and follow x 1 month.  If you are age 37 or older, your body mass index should be between 23-30. Your Body mass index is 19.05 kg/m. If this is out of the aforementioned range listed, please consider follow up with your Primary Care Provider.  If you are age 48 or younger, your body mass index should be between 19-25. Your Body mass index is 19.05 kg/m. If this is out of the aformentioned range listed, please consider follow up with your Primary Care Provider.   ________________________________________________________  The [place] [facility] providers would like to encourage you to use [facility] to communicate with providers for non-urgent requests or questions.  Due to long hold times on the telephone, sending your provider a message by [facility] may be a faster and more efficient way to get a response.  Please allow 48 business hours for a response.  Please remember that this is for non-urgent requests.  _______________________________________________________  Due to recent changes in healthcare laws, you may see the results of your imaging and laboratory studies on MyChart before your provider has had a chance to review them.  We understand that in some cases there may be results that are confusing or concerning to you. Not all laboratory results come back in the same time frame and the provider may be waiting for multiple results in order to interpret others.  Please give [place] 48 hours in order for your provider to thoroughly review all the results before contacting the office for clarification of your results.

## 2022-03-08 ENCOUNTER — Ambulatory Visit: Payer: No Typology Code available for payment source | Admitting: Internal Medicine

## 2022-03-21 ENCOUNTER — Ambulatory Visit: Payer: No Typology Code available for payment source | Admitting: Internal Medicine

## 2022-03-21 ENCOUNTER — Encounter: Payer: Self-pay | Admitting: Internal Medicine

## 2022-03-21 VITALS — BP 96/58 | HR 68 | Temp 98.1°F | Ht 66.0 in | Wt 122.4 lb

## 2022-03-21 DIAGNOSIS — J9383 Other pneumothorax: Secondary | ICD-10-CM | POA: Diagnosis not present

## 2022-03-21 NOTE — Progress Notes (Signed)
Yvonne Garcia    [id number]    [birth year]  Primary Care Physician:Garcia, Yvonne Carwin, MD Date of Appointment: 03/21/2022 Established Patient Visit  Chief complaint:   Chief Complaint  Patient presents with   Follow-up    Chest pain and tightness persistent      HPI: Yvonne Garcia is a 36 y.o. woman with history of endometriosis s/p total laparoscopic hysterectomy and anemia who presents for follow up after having a spontaneous pneumothorax with empyema. Had thoracentesis in IR which showed empyema. Had repeat thoracentesis with Dr. Merrily Garcia which showed an additional 600 cc of blood fluid and was treated with IV abx and discharged on omnicef. Upon obtaining further history, Yvonne Garcia has has had longstanding endometriosis for many years with debilitating pain and had significant extrauterine manifestations.  She had laparoscopic hysterectomy and lysis of adhesions which is over 3 hours long.  Surgery was march 22nd 2022.  Following the surgery she had extensive subcutaneous emphysema in her back, arms, neck.  At that time she did not have a pneumothorax.  Unfortunately his records not available for review.  The surgery was done in [place] at [facility] with Dr. Tonia Garcia who is a endometriosis expert.  Interval Updates:  Here for follow up from secondary pneumothorax, suspected catamenial. Overall since her surgery for hysterectomy this year, endometriosis symptoms are improved.  Having residual endometriosis symptoms that are cyclical including chest pressure and worsening pain on her left side at site of prior pneumothorax.  She has had subjective palpitations but when she checks HR on fit bit it is normal.  She denies dyspnea, chest tightness, wheezing. Symptoms are not bad enough that she takes medication.   Had covid about 2 weeks ago and also having some chest tightness and pain compared to this.   Hasn't followed up with anyone for endometriosis since her surgery which was  done in [place].  Has some residual anxiety following her hospitalization and does live in worry that she will have a recurrent pneumothorax.    I have reviewed the patient's family social and past medical history and updated as appropriate.   Past Medical History:  Diagnosis Date   Allergies    Anxiety    Endometriosis    of rectosigmoid junction (removed surgically)   Family history of breast cancer 05/05/2020   Family history of colon cancer 05/05/2020   Family history of kidney cancer 05/05/2020   Family history of pancreatic cancer 05/05/2020   Internal hemorrhoids    Pneumothorax     Past Surgical History:  Procedure Laterality Date   ABDOMINAL HYSTERECTOMY     APPENDECTOMY     LAPAROSCOPIC BOWEL RESECTION     low anterior resection  08/09/2020   for endometriosis of rectosigmoid junction   TONSILLECTOMY     WISDOM TOOTH EXTRACTION      Family History  Problem Relation Age of Onset   Mental illness Mother        suicide.    Pancreatic cancer Mother 59   Breast cancer Mother 21   Kidney cancer Father 17   Colon cancer Maternal Grandfather        dx 37s   Kidney cancer Paternal Grandfather        dx unknown age    Social History   Occupational History   Not on file  Tobacco Use   Smoking status: Never    Passive exposure: Never   Smokeless tobacco: Never  Vaping  Use   Vaping Use: Never used  Substance and Sexual Activity   Alcohol use: Yes    Comment: rare   Drug use: Never   Sexual activity: Not on file     Physical Exam: Blood pressure (!) 96/58, pulse 68, temperature 98.1 F (36.7 C), temperature source Oral, height 5\' 6"  (1.676 m), weight 122 lb 6.4 oz (55.5 kg), last menstrual period 05/18/2020, SpO2 99 %.  Gen:      No acute distress Lungs:    ctab, no wheezes or crackles, no increased work of breathing, no chest pain on palpation of left thorax.  CV:        RRR no mrg   Data Reviewed: Imaging: I have personally reviewed the CT Angio  from April 2022 as well as chest xrays subsequently which show resolving pleural effusion on the left side. Today's xray shows near complete resolution of left sided pleural effusion. There is improvement in the right apical ptx CT Chest does not show significant cysts or blebs.   PFTs:   Labs: Gram stain from pleural fluid shows WBCs, rare GPC in chains, few gram variable rods Plus fluid studies and chemistries show exudative effusion  Immunization status: Immunization History  Administered Date(s) Administered   HPV Quadrivalent 12/29/2007   PFIZER(Purple Top)SARS-COV-2 Vaccination 08/09/2019, 08/30/2019, 04/22/2020    Assessment:  Pneumothorax, spontaneous with pleural effusion.  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   Plan/Recommendations: No recurrence of pneumothorax.   Come back and see me sooner if issues or concerns about your breathing. Worsening chest tightness, wheezing, shortness of breath that doesn't follow your cyclical pattern. I hope following up with a therapist and considering maintenance medication for anxiety for your medical trauma will be helpful for you.   Return to Care: Return in about 1 year (around 03/22/2023).   Yvonne Llamas, MD Pulmonary and [place]

## 2022-03-21 NOTE — Patient Instructions (Addendum)
Please schedule follow up scheduled with myself in 1 year.  If my schedule is not open yet, we will contact you with a reminder closer to that time. Please call [phone] if you haven't heard from [place] a month before.   Come back and see me sooner if issues or concerns about your breathing. Worsening chest tightness, wheezing, shortness of breath that doesn't follow your cyclical pattern. I hope following up with a therapist and considering maintenance medication for anxiety for your medical trauma will be helpful for you.

## 2022-05-11 ENCOUNTER — Encounter (HOSPITAL_BASED_OUTPATIENT_CLINIC_OR_DEPARTMENT_OTHER): Payer: Self-pay | Admitting: Emergency Medicine

## 2022-05-11 ENCOUNTER — Emergency Department (HOSPITAL_BASED_OUTPATIENT_CLINIC_OR_DEPARTMENT_OTHER): Payer: No Typology Code available for payment source | Admitting: Radiology

## 2022-05-11 ENCOUNTER — Other Ambulatory Visit: Payer: Self-pay

## 2022-05-11 ENCOUNTER — Emergency Department (HOSPITAL_BASED_OUTPATIENT_CLINIC_OR_DEPARTMENT_OTHER)
Admission: EM | Admit: 2022-05-11 | Discharge: 2022-05-11 | Disposition: A | Discharge status: Home / Self Care | Payer: No Typology Code available for payment source | Attending: Emergency Medicine | Admitting: Emergency Medicine

## 2022-05-11 DIAGNOSIS — Y92410 Unspecified street and highway as the place of occurrence of the external cause: Secondary | ICD-10-CM | POA: Diagnosis not present

## 2022-05-11 DIAGNOSIS — R0789 Other chest pain: Secondary | ICD-10-CM | POA: Diagnosis not present

## 2022-05-11 DIAGNOSIS — S8002XA Contusion of left knee, initial encounter: Secondary | ICD-10-CM | POA: Insufficient documentation

## 2022-05-11 DIAGNOSIS — S8992XA Unspecified injury of left lower leg, initial encounter: Secondary | ICD-10-CM | POA: Diagnosis present

## 2022-05-11 MED ORDER — NAPROXEN 500 MG PO TABS: 500.0000 mg | ORAL_TABLET | Freq: Two times a day (BID) | ORAL | 0 refills | Status: AC

## 2022-05-11 MED ORDER — METHOCARBAMOL 1000 MG PO TABS: 1000.0000 mg | ORAL_TABLET | Freq: Two times a day (BID) | ORAL | 0 refills | Status: AC | PRN
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
## 2023-01-10 ENCOUNTER — Other Ambulatory Visit: Payer: Self-pay | Admitting: Obstetrics and Gynecology

## 2023-01-10 DIAGNOSIS — R928 Other abnormal and inconclusive findings on diagnostic imaging of breast: Secondary | ICD-10-CM

## 2023-01-23 ENCOUNTER — Other Ambulatory Visit: Payer: Self-pay | Admitting: Obstetrics and Gynecology

## 2023-01-23 ENCOUNTER — Ambulatory Visit
Admission: RE | Admit: 2023-01-23 | Discharge: 2023-01-23 | Disposition: A | Discharge status: Home / Self Care | Payer: No Typology Code available for payment source | Source: Ambulatory Visit | Attending: Obstetrics and Gynecology | Admitting: Obstetrics and Gynecology

## 2023-01-23 DIAGNOSIS — N6489 Other specified disorders of breast: Secondary | ICD-10-CM

## 2023-01-23 DIAGNOSIS — R928 Other abnormal and inconclusive findings on diagnostic imaging of breast: Secondary | ICD-10-CM

## 2023-01-25 ENCOUNTER — Ambulatory Visit
Admission: RE | Admit: 2023-01-25 | Discharge: 2023-01-25 | Disposition: A | Discharge status: Home / Self Care | Payer: No Typology Code available for payment source | Source: Ambulatory Visit | Attending: Obstetrics and Gynecology | Admitting: Obstetrics and Gynecology

## 2023-01-25 ENCOUNTER — Other Ambulatory Visit: Payer: Self-pay | Admitting: Obstetrics and Gynecology

## 2023-01-25 DIAGNOSIS — R102 Pelvic and perineal pain: Secondary | ICD-10-CM

## 2023-01-25 DIAGNOSIS — N6489 Other specified disorders of breast: Secondary | ICD-10-CM

## 2023-01-25 DIAGNOSIS — R928 Other abnormal and inconclusive findings on diagnostic imaging of breast: Secondary | ICD-10-CM

## 2023-01-25 HISTORY — PX: BREAST BIOPSY: SHX20

## 2023-01-29 ENCOUNTER — Other Ambulatory Visit: Payer: Self-pay | Admitting: Obstetrics and Gynecology

## 2023-01-29 DIAGNOSIS — Z9189 Other specified personal risk factors, not elsewhere classified: Secondary | ICD-10-CM

## 2023-02-11 ENCOUNTER — Other Ambulatory Visit (HOSPITAL_BASED_OUTPATIENT_CLINIC_OR_DEPARTMENT_OTHER): Payer: Self-pay

## 2023-02-11 MED ORDER — COVID-19 MRNA VAC-TRIS(PFIZER) 30 MCG/0.3ML IM SUSY
0.3000 mL | PREFILLED_SYRINGE | Freq: Once | INTRAMUSCULAR | 0 refills | Status: AC
  Filled 2023-02-11: qty 0.3, 1d supply, fill #0

## 2023-03-01 ENCOUNTER — Encounter: Payer: Self-pay | Admitting: Obstetrics and Gynecology

## 2023-03-09 ENCOUNTER — Ambulatory Visit
Admission: RE | Admit: 2023-03-09 | Discharge: 2023-03-09 | Disposition: A | Discharge status: Home / Self Care | Payer: No Typology Code available for payment source | Source: Ambulatory Visit | Attending: Obstetrics and Gynecology | Admitting: Obstetrics and Gynecology

## 2023-03-09 DIAGNOSIS — R102 Pelvic and perineal pain: Secondary | ICD-10-CM

## 2023-03-09 MED ORDER — GADOPICLENOL 0.5 MMOL/ML IV SOLN
7.0000 mL | Freq: Once | INTRAVENOUS | Status: AC | PRN
  Administered 2023-03-09: 7 mL via INTRAVENOUS

## 2023-03-12 ENCOUNTER — Ambulatory Visit
Admission: RE | Admit: 2023-03-12 | Discharge: 2023-03-12 | Disposition: A | Discharge status: Home / Self Care | Payer: No Typology Code available for payment source | Source: Ambulatory Visit | Attending: Obstetrics and Gynecology | Admitting: Obstetrics and Gynecology

## 2023-03-12 DIAGNOSIS — Z9189 Other specified personal risk factors, not elsewhere classified: Secondary | ICD-10-CM

## 2023-03-12 MED ORDER — GADOPICLENOL 0.5 MMOL/ML IV SOLN
5.0000 mL | Freq: Once | INTRAVENOUS | Status: AC | PRN
  Administered 2023-03-12: 5 mL via INTRAVENOUS

## 2023-03-29 ENCOUNTER — Ambulatory Visit: Payer: Self-pay | Admitting: Surgery

## 2023-03-29 DIAGNOSIS — N6311 Unspecified lump in the right breast, upper outer quadrant: Secondary | ICD-10-CM

## 2023-04-02 ENCOUNTER — Other Ambulatory Visit: Payer: Self-pay | Admitting: Surgery

## 2023-04-02 DIAGNOSIS — N6311 Unspecified lump in the right breast, upper outer quadrant: Secondary | ICD-10-CM

## 2023-04-04 ENCOUNTER — Encounter: Payer: Self-pay | Admitting: Internal Medicine

## 2023-04-04 ENCOUNTER — Ambulatory Visit: Payer: No Typology Code available for payment source | Admitting: Internal Medicine

## 2023-04-04 VITALS — BP 90/64 | HR 68 | Ht 66.0 in | Wt 125.8 lb

## 2023-04-04 DIAGNOSIS — N809 Endometriosis, unspecified: Secondary | ICD-10-CM

## 2023-04-04 DIAGNOSIS — J9312 Secondary spontaneous pneumothorax: Secondary | ICD-10-CM | POA: Diagnosis not present

## 2023-04-04 NOTE — Patient Instructions (Signed)
It was a pleasure to see you today!  Please schedule follow up scheduled with myself in 1 year or as needed.  If my schedule is not open yet, we will contact you with a reminder closer to that time. Please call [phone] if you haven't heard from [place] a month before, and always call us sooner if issues or concerns arise. You can also send [place] a message through MyChart, but but aware that this is not to be used for urgent issues and it may take up to 5-7 days to receive a reply. Please be aware that you will likely be able to view your results before I have a chance to respond to them. Please give [place] 5 business days to respond to any non-urgent results.   Good luck with your surgery. Since your endometriosis symptoms have otherwise calmed down, I am hopeful that your risk for pneumothorax related to this is also much less. Your main risk for pneumothorax at this point would be related to being put on a ventilator with general anesthesia. This is generally a very small risk. I think it is safe for you to proceed. If there are issues with this surgery, please send me a message and we will talk about a referral to a thoracic surgeon and further imaging.

## 2023-04-04 NOTE — Progress Notes (Signed)
Yvonne Garcia    [id number]    September 17, 1985  Primary Care Physician:Sun, [name], MD Date of Appointment: 04/04/2023 Established Patient Visit  Chief complaint:   Chief Complaint  Patient presents with   Follow-up    F/U visit. Pt is having a Lumpectomy of Right Breast in a few weeks and wants to discuss her lungs.      HPI: Yvonne Garcia is a 37 y.o. woman with history of endometriosis s/p total laparoscopic hysterectomy and anemia who presents for follow up after having a spontaneous pneumothorax with empyema. Had thoracentesis in IR which showed empyema. Had repeat thoracentesis with Dr. Merrily Pew which showed an additional 600 cc of blood fluid and was treated with IV abx and discharged on omnicef. Upon obtaining further history, Yvonne Garcia has has had longstanding endometriosis for many years with debilitating pain and had significant extrauterine manifestations.  She had laparoscopic hysterectomy and lysis of adhesions which is over 3 hours long.  Surgery was march 22nd 2022.  Following the surgery she had extensive subcutaneous emphysema in her back, arms, neck.  At that time she did not have a pneumothorax.  Unfortunately his records not available for review.  The surgery was done in [place] at [facility] with Dr. Tonia Brooms who is a endometriosis expert.  Interval Updates: Overall doing well. She is having a lumpectomy for abnormal mammogram and is here because she wants to discuss her risk for recurrent pneumothorax following general anesthesia.  No respiratory issues. Since her hysterectomy her endometriosis symptoms are 90% resolved. She denies chest tightness, wheezing, coughing or pain that is limiting daily activities.    I have reviewed the patient's family social and past medical history and updated as appropriate.   Past Medical History:  Diagnosis Date   Allergies    Anxiety    Endometriosis    of rectosigmoid junction (removed surgically)   Family history  of breast cancer 05/05/2020   Family history of colon cancer 05/05/2020   Family history of kidney cancer 05/05/2020   Family history of pancreatic cancer 05/05/2020   Internal hemorrhoids    Pneumothorax     Past Surgical History:  Procedure Laterality Date   ABDOMINAL HYSTERECTOMY     APPENDECTOMY     BREAST BIOPSY Right 01/25/2023   MM RT BREAST BX W LOC DEV 1ST LESION IMAGE BX SPEC STEREO GUIDE 01/25/2023 [facility]   LAPAROSCOPIC BOWEL RESECTION     low anterior resection  08/09/2020   for endometriosis of rectosigmoid junction   TONSILLECTOMY     WISDOM TOOTH EXTRACTION      Family History  Problem Relation Age of Onset   Mental illness Mother        suicide.    Pancreatic cancer Mother 44   Breast cancer Mother 47   Kidney cancer Father 60   Colon cancer Maternal Grandfather        dx 67s   Kidney cancer Paternal Grandfather        dx unknown age    Social History   Occupational History   Not on file  Tobacco Use   Smoking status: Never    Passive exposure: Never   Smokeless tobacco: Never  Vaping Use   Vaping status: Never Used  Substance and Sexual Activity   Alcohol use: Yes    Comment: rare   Drug use: Never   Sexual activity: Not on file     Physical Exam: Blood  pressure 90/64, pulse 68, height 5\' 6"  (1.676 m), weight 125 lb 12.8 oz (57.1 kg), last menstrual period 05/18/2020, SpO2 98%.  Gen:      No distress Lungs:    ctab no wheezes or crackles CV:        RRR no mrg   Data Reviewed: Imaging: I have personally reviewed the CT Angio from April 2022 as well as chest xrays subsequently which show resolving pleural effusion on the left side. Today's xray shows near complete resolution of left sided pleural effusion. There is improvement in the right apical ptx CT Chest does not show significant cysts or blebs.   PFTs:   Labs: Gram stain from pleural fluid shows WBCs, rare GPC in chains, few gram variable rods Plus fluid studies and  chemistries show exudative effusion  Immunization status: Immunization History  Administered Date(s) Administered   HPV Quadrivalent 12/29/2007   PFIZER(Purple Top)SARS-COV-2 Vaccination 08/09/2019, 08/30/2019, 04/22/2020   Pfizer(Comirnaty)Fall Seasonal Vaccine 12 years and older 02/11/2023    Assessment:  Pneumothorax, spontaneous with pleural effusion.  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  Endometriosis s/p hysterectomy Abnormal mammogram - lumpectomy planned  Plan/Recommendations: Yvonne Garcia has had two pneumothoraces - at least one of them suspected to be catamenial ptx vs post-procedural with associated empyema following her abdominal surgery. She no longer has cyclical symptoms of endometriosis. She has had no further pneumothoraces in the last 2 years. At this point I would assume her risk for pneumothorax related to mechanical ventilation is that of the average person. She had no structural explanation for pneumothorax in her previous scans  such as blebs or emphysema.   If she does have recurrent pneumothorax I would recommend CT Chest and referral to thoracic surgery for discussion of pleurodesis.   Return to Care: Return in about 1 year (around 04/03/2024).  I spent 30 minutes in the care of this patient today including pre-charting, chart review, review of results, face-to-face care, coordination of care and communication with consultants etc.).   Durel Salts, MD Pulmonary and Critical Care Medicine [facility] Office:[phone]

## 2023-04-11 ENCOUNTER — Encounter (HOSPITAL_BASED_OUTPATIENT_CLINIC_OR_DEPARTMENT_OTHER): Payer: Self-pay | Admitting: Surgery

## 2023-04-11 ENCOUNTER — Other Ambulatory Visit: Payer: Self-pay

## 2023-04-11 NOTE — Progress Notes (Signed)
Chart reviewed with Dr. Isaias Cowman, anesthesiologist, including pulmonary history and most recent pulmonologist visit note (04/04/23). Pt denies any respiratory symptoms at this time. Okay to proceed with surgery at [facility] as planned per aforesaid pending any significant changes to patient health.

## 2023-04-14 ENCOUNTER — Other Ambulatory Visit: Payer: Self-pay

## 2023-04-14 ENCOUNTER — Emergency Department (HOSPITAL_BASED_OUTPATIENT_CLINIC_OR_DEPARTMENT_OTHER): Payer: No Typology Code available for payment source | Admitting: Radiology

## 2023-04-14 ENCOUNTER — Emergency Department (HOSPITAL_BASED_OUTPATIENT_CLINIC_OR_DEPARTMENT_OTHER)
Admission: EM | Admit: 2023-04-14 | Discharge: 2023-04-14 | Disposition: A | Discharge status: Home / Self Care | Payer: No Typology Code available for payment source | Attending: Emergency Medicine | Admitting: Emergency Medicine

## 2023-04-14 ENCOUNTER — Encounter (HOSPITAL_BASED_OUTPATIENT_CLINIC_OR_DEPARTMENT_OTHER): Payer: Self-pay | Admitting: Emergency Medicine

## 2023-04-14 DIAGNOSIS — M898X5 Other specified disorders of bone, thigh: Secondary | ICD-10-CM

## 2023-04-14 DIAGNOSIS — R0789 Other chest pain: Secondary | ICD-10-CM | POA: Insufficient documentation

## 2023-04-14 DIAGNOSIS — Y92410 Unspecified street and highway as the place of occurrence of the external cause: Secondary | ICD-10-CM | POA: Insufficient documentation

## 2023-04-14 DIAGNOSIS — M79652 Pain in left thigh: Secondary | ICD-10-CM | POA: Insufficient documentation

## 2023-04-14 DIAGNOSIS — M25512 Pain in left shoulder: Secondary | ICD-10-CM | POA: Diagnosis not present

## 2023-04-14 MED ORDER — METHOCARBAMOL 500 MG PO TABS
500.0000 mg | ORAL_TABLET | Freq: Once | ORAL | Status: AC
  Administered 2023-04-14: 500 mg via ORAL
  Filled 2023-04-14: qty 1

## 2023-04-14 MED ORDER — METHOCARBAMOL 500 MG PO TABS: 500.0000 mg | ORAL_TABLET | Freq: Two times a day (BID) | ORAL | 0 refills | Status: AC

## 2023-04-14 MED ORDER — LIDOCAINE 5 % EX PTCH: 1.0000 | MEDICATED_PATCH | CUTANEOUS | 0 refills | Status: AC

## 2023-04-14 MED ORDER — ACETAMINOPHEN 325 MG PO TABS
650.0000 mg | ORAL_TABLET | Freq: Once | ORAL | Status: AC
  Administered 2023-04-14: 650 mg via ORAL
  Filled 2023-04-14: qty 2
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
## 2023-04-14 NOTE — ED Notes (Signed)
Patient transported to X-ray 

## 2023-04-14 NOTE — Discharge Instructions (Addendum)
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
## 2023-04-22 ENCOUNTER — Ambulatory Visit
Admission: RE | Admit: 2023-04-22 | Discharge: 2023-04-22 | Disposition: A | Discharge status: Home / Self Care | Payer: No Typology Code available for payment source | Source: Ambulatory Visit | Attending: Surgery | Admitting: Surgery

## 2023-04-22 DIAGNOSIS — N6311 Unspecified lump in the right breast, upper outer quadrant: Secondary | ICD-10-CM

## 2023-04-22 HISTORY — PX: BREAST BIOPSY: SHX20

## 2023-04-23 ENCOUNTER — Ambulatory Visit (HOSPITAL_BASED_OUTPATIENT_CLINIC_OR_DEPARTMENT_OTHER): Payer: No Typology Code available for payment source | Admitting: Anesthesiology

## 2023-04-23 ENCOUNTER — Ambulatory Visit (HOSPITAL_BASED_OUTPATIENT_CLINIC_OR_DEPARTMENT_OTHER)
Admission: RE | Admit: 2023-04-23 | Discharge: 2023-04-23 | Disposition: A | Discharge status: Home / Self Care | Payer: No Typology Code available for payment source | Attending: Surgery | Admitting: Surgery

## 2023-04-23 ENCOUNTER — Encounter (HOSPITAL_BASED_OUTPATIENT_CLINIC_OR_DEPARTMENT_OTHER)
Admission: RE | Disposition: A | Discharge status: Home / Self Care | Payer: Self-pay | Source: Home / Self Care | Attending: Surgery

## 2023-04-23 ENCOUNTER — Other Ambulatory Visit: Payer: Self-pay

## 2023-04-23 ENCOUNTER — Ambulatory Visit
Admission: RE | Admit: 2023-04-23 | Discharge: 2023-04-23 | Disposition: A | Discharge status: Home / Self Care | Payer: No Typology Code available for payment source | Source: Ambulatory Visit | Attending: Surgery | Admitting: Surgery

## 2023-04-23 ENCOUNTER — Encounter (HOSPITAL_BASED_OUTPATIENT_CLINIC_OR_DEPARTMENT_OTHER): Payer: Self-pay | Admitting: Surgery

## 2023-04-23 DIAGNOSIS — Z803 Family history of malignant neoplasm of breast: Secondary | ICD-10-CM | POA: Insufficient documentation

## 2023-04-23 DIAGNOSIS — Z8051 Family history of malignant neoplasm of kidney: Secondary | ICD-10-CM | POA: Insufficient documentation

## 2023-04-23 DIAGNOSIS — D649 Anemia, unspecified: Secondary | ICD-10-CM | POA: Diagnosis not present

## 2023-04-23 DIAGNOSIS — N6021 Fibroadenosis of right breast: Secondary | ICD-10-CM | POA: Insufficient documentation

## 2023-04-23 DIAGNOSIS — Z800 Family history of malignant neoplasm of digestive organs: Secondary | ICD-10-CM | POA: Insufficient documentation

## 2023-04-23 DIAGNOSIS — N6311 Unspecified lump in the right breast, upper outer quadrant: Secondary | ICD-10-CM

## 2023-04-23 DIAGNOSIS — Z01818 Encounter for other preprocedural examination: Secondary | ICD-10-CM

## 2023-04-23 HISTORY — DX: Other complications of anesthesia, initial encounter: T88.59XA

## 2023-04-23 HISTORY — DX: Anemia, unspecified: D64.9

## 2023-04-23 HISTORY — PX: BREAST LUMPECTOMY WITH RADIOACTIVE SEED LOCALIZATION: SHX6424

## 2023-04-23 SURGERY — BREAST LUMPECTOMY WITH RADIOACTIVE SEED LOCALIZATION
Anesthesia: General | Site: Breast | Laterality: Right

## 2023-04-23 MED ORDER — OXYCODONE HCL 5 MG PO TABS
ORAL_TABLET | ORAL | Status: AC
  Filled 2023-04-23: qty 1

## 2023-04-23 MED ORDER — CHLORHEXIDINE GLUCONATE CLOTH 2 % EX PADS: 6.0000 | MEDICATED_PAD | Freq: Once | CUTANEOUS | Status: DC

## 2023-04-23 MED ORDER — OXYCODONE HCL 5 MG PO TABS
5.0000 mg | ORAL_TABLET | Freq: Once | ORAL | Status: AC
  Administered 2023-04-23: 5 mg via ORAL

## 2023-04-23 MED ORDER — FENTANYL CITRATE (PF) 100 MCG/2ML IJ SOLN
INTRAMUSCULAR | Status: DC | PRN
  Administered 2023-04-23 (×2): 25 ug via INTRAVENOUS
  Administered 2023-04-23: 50 ug via INTRAVENOUS

## 2023-04-23 MED ORDER — LIDOCAINE 2% (20 MG/ML) 5 ML SYRINGE
INTRAMUSCULAR | Status: DC | PRN
  Administered 2023-04-23: 100 mg via INTRAVENOUS

## 2023-04-23 MED ORDER — MIDAZOLAM HCL 5 MG/5ML IJ SOLN
INTRAMUSCULAR | Status: DC | PRN
  Administered 2023-04-23: 2 mg via INTRAVENOUS

## 2023-04-23 MED ORDER — OXYCODONE HCL 5 MG PO TABS: 5.0000 mg | ORAL_TABLET | Freq: Four times a day (QID) | ORAL | 0 refills | Status: AC | PRN

## 2023-04-23 MED ORDER — ACETAMINOPHEN 500 MG PO TABS
1000.0000 mg | ORAL_TABLET | Freq: Once | ORAL | Status: AC
  Administered 2023-04-23: 1000 mg via ORAL

## 2023-04-23 MED ORDER — DEXAMETHASONE SODIUM PHOSPHATE 4 MG/ML IJ SOLN
INTRAMUSCULAR | Status: DC | PRN
  Administered 2023-04-23: 10 mg via INTRAVENOUS

## 2023-04-23 MED ORDER — ONDANSETRON HCL 4 MG/2ML IJ SOLN
INTRAMUSCULAR | Status: DC | PRN
  Administered 2023-04-23: 4 mg via INTRAVENOUS

## 2023-04-23 MED ORDER — FENTANYL CITRATE (PF) 100 MCG/2ML IJ SOLN: 25.0000 ug | INTRAMUSCULAR | Status: DC | PRN

## 2023-04-23 MED ORDER — CEFAZOLIN SODIUM-DEXTROSE 2-4 GM/100ML-% IV SOLN
INTRAVENOUS | Status: AC
  Filled 2023-04-23: qty 100

## 2023-04-23 MED ORDER — CEFAZOLIN SODIUM-DEXTROSE 2-4 GM/100ML-% IV SOLN
2.0000 g | INTRAVENOUS | Status: AC
Start: 2023-04-23 — End: 2023-04-23
  Administered 2023-04-23: 2 g via INTRAVENOUS

## 2023-04-23 MED ORDER — AMISULPRIDE (ANTIEMETIC) 5 MG/2ML IV SOLN: 10.0000 mg | Freq: Once | INTRAVENOUS | Status: DC | PRN

## 2023-04-23 MED ORDER — FENTANYL CITRATE (PF) 100 MCG/2ML IJ SOLN
INTRAMUSCULAR | Status: AC
  Filled 2023-04-23: qty 2

## 2023-04-23 MED ORDER — MIDAZOLAM HCL 2 MG/2ML IJ SOLN
INTRAMUSCULAR | Status: AC
Start: 2023-04-23 — End: ?
  Filled 2023-04-23: qty 2

## 2023-04-23 MED ORDER — PROPOFOL 10 MG/ML IV BOLUS
INTRAVENOUS | Status: DC | PRN
  Administered 2023-04-23: 175 ug/kg/min via INTRAVENOUS
  Administered 2023-04-23: 150 ug/kg/min via INTRAVENOUS
  Administered 2023-04-23: 200 mg via INTRAVENOUS

## 2023-04-23 MED ORDER — LACTATED RINGERS IV SOLN: INTRAVENOUS | Status: DC

## 2023-04-23 MED ORDER — ACETAMINOPHEN 500 MG PO TABS
ORAL_TABLET | ORAL | Status: AC
  Filled 2023-04-23: qty 2

## 2023-04-23 MED ORDER — BUPIVACAINE-EPINEPHRINE (PF) 0.25% -1:200000 IJ SOLN
INTRAMUSCULAR | Status: DC | PRN
  Administered 2023-04-23: 17 mL

## 2023-04-23 SURGICAL SUPPLY — 41 items
APPLIER CLIP 9.375 MED OPEN (MISCELLANEOUS)
BINDER BREAST LRG (GAUZE/BANDAGES/DRESSINGS) IMPLANT
BINDER BREAST MEDIUM (GAUZE/BANDAGES/DRESSINGS) IMPLANT
BLADE SURG 15 STRL LF DISP TIS (BLADE) ×2 IMPLANT
CANISTER SUC SOCK COL 7IN (MISCELLANEOUS) IMPLANT
CANISTER SUCT 1200ML W/VALVE (MISCELLANEOUS) IMPLANT
CHLORAPREP W/TINT 26 (MISCELLANEOUS) ×2 IMPLANT
CLIP APPLIE 9.375 MED OPEN (MISCELLANEOUS) IMPLANT
COVER BACK TABLE 60X90IN (DRAPES) ×2 IMPLANT
COVER MAYO STAND STRL (DRAPES) ×2 IMPLANT
COVER PROBE CYLINDRICAL 5X96 (MISCELLANEOUS) ×2 IMPLANT
DERMABOND ADVANCED .7 DNX12 (GAUZE/BANDAGES/DRESSINGS) ×2 IMPLANT
DRAPE LAPAROTOMY 100X72 PEDS (DRAPES) ×2 IMPLANT
DRAPE UTILITY XL STRL (DRAPES) ×2 IMPLANT
ELECT COATED BLADE 2.86 ST (ELECTRODE) ×2 IMPLANT
ELECT REM PT RETURN 9FT ADLT (ELECTROSURGICAL) ×1
ELECTRODE REM PT RTRN 9FT ADLT (ELECTROSURGICAL) ×2 IMPLANT
GLOVE BIO SURGEON STRL SZ 6.5 (GLOVE) IMPLANT
GLOVE BIOGEL PI IND STRL 8 (GLOVE) ×2 IMPLANT
GLOVE ECLIPSE 8.0 STRL XLNG CF (GLOVE) ×2 IMPLANT
GOWN STRL REUS W/ TWL LRG LVL3 (GOWN DISPOSABLE) ×4 IMPLANT
GOWN STRL REUS W/ TWL XL LVL3 (GOWN DISPOSABLE) ×2 IMPLANT
HEMOSTAT ARISTA ABSORB 3G PWDR (HEMOSTASIS) IMPLANT
HEMOSTAT SNOW SURGICEL 2X4 (HEMOSTASIS) IMPLANT
KIT MARKER MARGIN INK (KITS) ×2 IMPLANT
NDL HYPO 25X1 1.5 SAFETY (NEEDLE) ×2 IMPLANT
NEEDLE HYPO 25X1 1.5 SAFETY (NEEDLE) ×1
NS IRRIG 1000ML POUR BTL (IV SOLUTION) ×2 IMPLANT
PACK BASIN DAY SURGERY FS (CUSTOM PROCEDURE TRAY) ×2 IMPLANT
PENCIL SMOKE EVACUATOR (MISCELLANEOUS) ×2 IMPLANT
SLEEVE SCD COMPRESS KNEE MED (STOCKING) ×2 IMPLANT
SPIKE FLUID TRANSFER (MISCELLANEOUS) IMPLANT
SPONGE T-LAP 4X18 ~~LOC~~+RFID (SPONGE) ×2 IMPLANT
SUT MNCRL AB 4-0 PS2 18 (SUTURE) ×2 IMPLANT
SUT SILK 2 0 SH (SUTURE) IMPLANT
SUT VICRYL 3-0 CR8 SH (SUTURE) ×2 IMPLANT
SYR CONTROL 10ML LL (SYRINGE) ×2 IMPLANT
TOWEL GREEN STERILE FF (TOWEL DISPOSABLE) ×2 IMPLANT
TRAY FAXITRON CT DISP (TRAY / TRAY PROCEDURE) ×2 IMPLANT
TUBE CONNECTING 20X1/4 (TUBING) IMPLANT
YANKAUER SUCT BULB TIP NO VENT (SUCTIONS) IMPLANT

## 2023-04-23 NOTE — Anesthesia Procedure Notes (Signed)
Procedure Name: LMA Insertion Date/Time: 04/23/2023 11:41 AM  Performed by: Caren Macadam, CRNAPre-anesthesia Checklist: Patient identified, Emergency Drugs available, Suction available, Patient being monitored and Timeout performed Patient Re-evaluated:Patient Re-evaluated prior to induction Oxygen Delivery Method: Circle system utilized Preoxygenation: Pre-oxygenation with 100% oxygen Induction Type: IV induction Ventilation: Mask ventilation without difficulty LMA: LMA inserted LMA Size: 4.0 Dental Injury: Teeth and Oropharynx as per pre-operative assessment

## 2023-04-23 NOTE — Discharge Instructions (Addendum)
Post Anesthesia Home Care Instructions  Activity: Get plenty of rest for the remainder of the day. A responsible individual must stay with you for 24 hours following the procedure.  For the next 24 hours, DO NOT: -Drive a car -Operate machinery -Drink alcoholic beverages -Take any medication unless instructed by your physician -Make any legal decisions or sign important papers.  Meals: Start with liquid foods such as gelatin or soup. Progress to regular foods as tolerated. Avoid greasy, spicy, heavy foods. If nausea and/or vomiting occur, drink only clear liquids until the nausea and/or vomiting subsides. Call your physician if vomiting continues.  Special Instructions/Symptoms: Your throat may feel dry or sore from the anesthesia or the breathing tube placed in your throat during surgery. If this causes discomfort, gargle with warm salt water. The discomfort should disappear within 24 hours.  If you had a scopolamine patch placed behind your ear for the management of post- operative nausea and/or vomiting:  1. The medication in the patch is effective for 72 hours, after which it should be removed.  Wrap patch in a tissue and discard in the trash. Wash hands thoroughly with soap and water. 2. You may remove the patch earlier than 72 hours if you experience unpleasant side effects which may include dry mouth, dizziness or visual disturbances. 3. Avoid touching the patch. Wash your hands with soap and water after contact with the patch.            [facility] Office Phone Number [phone]  BREAST BIOPSY/ PARTIAL MASTECTOMY: POST OP INSTRUCTIONS  Always review your discharge instruction sheet given to you by the facility where your surgery was performed.  IF YOU HAVE DISABILITY OR FAMILY LEAVE FORMS, YOU MUST BRING THEM TO THE OFFICE FOR PROCESSING.  DO NOT GIVE THEM TO YOUR DOCTOR.  A prescription for pain medication may be given to you upon discharge.  Take  your pain medication as prescribed, if needed.  If narcotic pain medicine is not needed, then you may take acetaminophen (Tylenol) or ibuprofen (Advil) as needed. Take your usually prescribed medications unless otherwise directed If you need a refill on your pain medication, please contact your pharmacy.  They will contact our office to request authorization.  Prescriptions will not be filled after 5pm or on week-ends. You should eat very light the first 24 hours after surgery, such as soup, crackers, pudding, etc.  Resume your normal diet the day after surgery. Most patients will experience some swelling and bruising in the breast.  Ice packs and a good support bra will help.  Swelling and bruising can take several days to resolve.  It is common to experience some constipation if taking pain medication after surgery.  Increasing fluid intake and taking a stool softener will usually help or prevent this problem from occurring.  A mild laxative (Milk of Magnesia or Miralax) should be taken according to package directions if there are no bowel movements after 48 hours. Unless discharge instructions indicate otherwise, you may remove your bandages 24-48 hours after surgery, and you may shower at that time.  You may have steri-strips (small skin tapes) in place directly over the incision.  These strips should be left on the skin for 7-10 days.  If your surgeon used skin glue on the incision, you may shower in 24 hours.  The glue will flake off over the next 2-3 weeks.  Any sutures or staples will be removed at the office during your follow-up visit. ACTIVITIES:    You may resume regular daily activities (gradually increasing) beginning the next day.  Wearing a good support bra or sports bra minimizes pain and swelling.  You may have sexual intercourse when it is comfortable. You may drive when you no longer are taking prescription pain medication, you can comfortably wear a seatbelt, and you can safely maneuver  your car and apply brakes. RETURN TO WORK:  ______________________________________________________________________________________ You should see your doctor in the office for a follow-up appointment approximately two weeks after your surgery.  Your doctor's nurse will typically make your follow-up appointment when she calls you with your pathology report.  Expect your pathology report 2-3 business days after your surgery.  You may call to check if you do not hear from us after three days. OTHER INSTRUCTIONS: _______________________________________________________________________________________________ _____________________________________________________________________________________________________________________________________ _____________________________________________________________________________________________________________________________________ _____________________________________________________________________________________________________________________________________  WHEN TO CALL YOUR DOCTOR: Fever over 101.0 Nausea and/or vomiting. Extreme swelling or bruising. Continued bleeding from incision. Increased pain, redness, or drainage from the incision.  The clinic staff is available to answer your questions during regular business hours.  Please don't hesitate to call and ask to speak to one of the nurses for clinical concerns.  If you have a medical emergency, go to the nearest emergency room or call 911.  A surgeon from [facility] is always on call at the hospital.  For further questions, please visit [email or web address]   

## 2023-04-23 NOTE — Transfer of Care (Signed)
Immediate Anesthesia Transfer of Care Note  Patient: Yvonne Garcia  Procedure(s) Performed: RIGHT BREAST SEED LUMPECTOMY (Right: Breast)  Patient Location: PACU  Anesthesia Type:General  Level of Consciousness: awake  Airway & Oxygen Therapy: Patient Spontanous Breathing  Post-op Assessment: Report given to RN and Post -op Vital signs reviewed and stable  Post vital signs: Reviewed and stable  Last Vitals:  Vitals Value Taken Time  BP 97/52 04/23/23 1218  Temp    Pulse 79 04/23/23 1220  Resp 10 04/23/23 1220  SpO2 100 % 04/23/23 1220  Vitals shown include unfiled device data.  Last Pain:  Vitals:   04/23/23 0858  TempSrc: Oral  PainSc: 2          Complications: No notable events documented.

## 2023-04-23 NOTE — Op Note (Addendum)
Preoperative diagnosis: Right breast mass discordant right upper quadrant  Postoperative diagnosis: Same  Procedure: Right breast seed localized lumpectomy  Surgeon: Harriette Bouillon, MD  Anesthesia: LMA with 0.25% Marcaine with epinephrine  EBL: Minimal  Specimen: Right breast mass with seed and clip verified by Faxitron  Drains: None  Indications for procedure: The patient is a 37 year old female with a discordant right breast mass.  Core biopsy was benign but the imaging was concerning therefore excision recommended.  The pros and cons of surgery reviewed with the patient and she agreed to proceed.The procedure has been discussed with the patient. Alternatives to surgery have been discussed with the patient.  Risks of surgery include bleeding,  Infection,  Seroma formation, death,  and the need for further surgery.   The patient understands and wishes to proceed.       The patient was seen in the holding area and questions were answered.  The right breast was marked as the correct site and films were available for review.  A seed was placed as an outpatient.  She was taken back to the operating room.  She was placed supine  upon the OR table.  After induction of general anesthesia, right breast was prepped and draped in a sterile fashion and a timeout performed.  The neoprobe was used to identify the seed in the right breast at about the 3 o'clock position approximately 3 cm from the areola.  A curvilinear incision was made over the signal.  Dissection was carried down all tissue and the seed and clip were excised with grossly negative margins.  The tissue was oriented with ink.  Imaging revealed both the seed to be present in the specimen and this was sent on the pathology.  The cavity was irrigated.  Local anesthetic infiltrated.  Hemostasis was achieved.  Deep tissue planes approximated with 3-0 Vicryl.  4 Monocryl was used to close the skin in a subcuticular fashion.  Dermabond applied.   Breast binder placed.  All counts found to be correct.  Patient was awoke extubated taken to recovery in satisfactory condition.

## 2023-04-23 NOTE — Anesthesia Postprocedure Evaluation (Signed)
Anesthesia Post Note  Patient: Yvonne Garcia  Procedure(s) Performed: RIGHT BREAST SEED LUMPECTOMY (Right: Breast)     Patient location during evaluation: PACU Anesthesia Type: General Level of consciousness: awake and alert Pain management: pain level controlled Vital Signs Assessment: post-procedure vital signs reviewed and stable Respiratory status: spontaneous breathing, nonlabored ventilation, respiratory function stable and patient connected to nasal cannula oxygen Cardiovascular status: blood pressure returned to baseline and stable Postop Assessment: no apparent nausea or vomiting Anesthetic complications: no  No notable events documented.  Last Vitals:  Vitals:   04/23/23 1300 04/23/23 1312  BP: 98/65 103/69  Pulse: 62 70  Resp: (!) 7 16  Temp:  36.9 C  SpO2: 100% 100%    Last Pain:  Vitals:   04/23/23 1312  TempSrc:   PainSc: 0-No pain                 Kennieth Rad

## 2023-04-23 NOTE — H&P (Signed)
History of Present Illness: Yvonne Garcia is a 37 y.o. female who is seen today as an office consultation for evaluation of NEW BREAST  Patient sent for evaluation of a right breast distortion or recent screening mammogram. Core biopsy done which showed fibrocystic changes. This was felt to be discordant. She presents today to discuss neck steps. She does have family history of breast cancer in her mother and pancreatic cancer as well. Her father and renal cell carcinoma. No history of ovarian cancer. Her mother was diagnosed in her 66s.  Review of Systems: A complete review of systems was obtained from the patient. I have reviewed this information and discussed as appropriate with the patient. See HPI as well for other ROS.    Medical History: Past Medical History:  Diagnosis Date  Anxiety   There is no problem list on file for this patient.  Past Surgical History:  Procedure Laterality Date  APPENDECTOMY  HYSTERECTOMY  LAPAROSCOPIC BOWEL RESECTION  TONSILLECTOMY    No Known Allergies  Current Outpatient Medications on File Prior to Visit  Medication Sig Dispense Refill  cholecalciferol (VITAMIN D3) 1000 unit tablet Take 1,000 Units by mouth once daily  cyanocobalamin (VITAMIN B12) 1000 MCG tablet Take 1,000 mcg by mouth once daily   No current facility-administered medications on file prior to visit.   Family History  Problem Relation Age of Onset  Breast cancer Mother  High blood pressure (Hypertension) Brother    Social History   Tobacco Use  Smoking Status Never  Smokeless Tobacco Never    Social History   Socioeconomic History  Marital status: Married  Tobacco Use  Smoking status: Never  Smokeless tobacco: Never  Substance and Sexual Activity  Alcohol use: Never  Drug use: Never   Objective:   Vitals:  02/12/23 0905 02/12/23 0910  BP: 102/50  Pulse: 86  Temp: 37 C (98.6 F)  SpO2: 98%  Weight: 55.8 kg (123 lb)  Height: 167.6 cm (5\' 6" )  PainSc:  0-No pain  PainLoc: Breast   Body mass index is 19.85 kg/m.  Physical Exam HENT:  Head: Normocephalic.  Cardiovascular:  Rate and Rhythm: Normal rate.  Chest:  Breasts: Right: Normal.  Left: Normal.   Comments: Mild bruising  Lymphadenopathy:  Upper Body:  Right upper body: No supraclavicular or axillary adenopathy.  Left upper body: No supraclavicular or axillary adenopathy.  Skin: General: Skin is warm.  Neurological:  General: No focal deficit present.  Mental Status: She is alert.  Psychiatric:  Mood and Affect: Mood normal.     Labs, Imaging and Diagnostic Testing:  CLINICAL DATA: 37 year old female presents for further evaluation of possible RIGHT breast distortion and possible RIGHT breast asymmetry identified on screening mammogram.  EXAM: DIGITAL DIAGNOSTIC UNILATERAL RIGHT MAMMOGRAM WITH TOMOSYNTHESIS AND CAD; ULTRASOUND RIGHT BREAST LIMITED  TECHNIQUE: Right digital diagnostic mammography and breast tomosynthesis was performed. The images were evaluated with computer-aided detection. ; Targeted ultrasound examination of the right breast was performed  COMPARISON: Previous exam(s).  ACR Breast Density Category c: The breasts are heterogeneously dense, which may obscure small masses.  FINDINGS: Full field and spot compression views of the RIGHT breast demonstrate persistent distortion within the anterior OUTER RIGHT breast, best identified on the MLO spot compression view.  Targeted ultrasound is performed, showing no sonographic correlate to the OUTER RIGHT breast distortion identified mammographically.  No suspicious findings are noted within the [facility] RIGHT breast.  No abnormal RIGHT axillary lymph nodes are identified.  IMPRESSION: 1. Persistent  OUTER RIGHT breast distortion, without sonographic correlate. Tissue sampling is recommended. No abnormal appearing RIGHT axillary lymph nodes. 2. No other suspicious abnormalities  identified.  RECOMMENDATION: Stereotactic guided RIGHT breast biopsy, which will be scheduled.  I have discussed the findings and recommendations with the patient. If applicable, a reminder letter will be sent to the patient regarding the next appointment.  BI-RADS CATEGORY 4: Suspicious.   Electronically Signed By: Harmon Pier M.D. 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 ECTATIC MAMMARY DUCTS WITH PERIDUCTAL INFLAMMATION. NEGATIVE FOR ATYPIA OR MALIGNANCY. Lance Coon MD Pathologist, Electronic Signature (Case signed 01/28/2023)  Assessment and Plan:   Diagnoses and all orders for this visit:  Mass of upper outer quadrant of right breast   Discussed the significance of discordant biopsy. Discussed the role of lumpectomy in these cases. Discussed potential upgrade risk of 5% or so. Discussed observation with reimaging.  She is scheduled for breast MRI from her gynecologist due to high risk assessment.  She will wait to that is done in the negative incision about surgery. She will reach out afterwards if she has any questions or concerns of asked her to forward a copy of her MRI so we can review as well.   Hayden Rasmussen, MD   I spent a total of 40 minutes in both face-to-face and non-face-to-face activities, excluding procedures performed, for this visit on the date of this encounter.

## 2023-04-23 NOTE — Interval H&P Note (Signed)
History and Physical Interval Note:  04/23/2023 11:06 AM  Yvonne Garcia  has presented today for surgery, with the diagnosis of RIGHT BREAST MASS.  The various methods of treatment have been discussed with the patient and family. After consideration of risks, benefits and other options for treatment, the patient has consented to  Procedure(s): RIGHT BREAST SEED LUMPECTOMY (Right) as a surgical intervention.  The patient's history has been reviewed, patient examined, no change in status, stable for surgery.  I have reviewed the patient's chart and labs.  Questions were answered to the patient's satisfaction.     Sonora Catlin A Masud Holub

## 2023-04-23 NOTE — Anesthesia Preprocedure Evaluation (Addendum)
Anesthesia Evaluation  Patient identified by MRN, date of birth, ID band Patient awake    Reviewed: Allergy & Precautions, NPO status , Patient's Chart, lab work & pertinent test results  History of Anesthesia Complications (+) PONV and history of anesthetic complications  Airway Mallampati: II  TM Distance: >3 FB Neck ROM: Full    Dental   Pulmonary neg pulmonary ROS   breath sounds clear to auscultation       Cardiovascular negative cardio ROS  Rhythm:Regular Rate:Normal     Neuro/Psych negative neurological ROS     GI/Hepatic negative GI ROS, Neg liver ROS,,,  Endo/Other  negative endocrine ROS    Renal/GU negative Renal ROS     Musculoskeletal   Abdominal   Peds  Hematology  (+) Blood dyscrasia, anemia   Anesthesia Other Findings   Reproductive/Obstetrics                             Anesthesia Physical Anesthesia Plan  ASA: 2  Anesthesia Plan: General   Post-op Pain Management: Tylenol PO (pre-op)* and Toradol IV (intra-op)*   Induction: Intravenous  PONV Risk Score and Plan: 3 and Dexamethasone, Ondansetron, Midazolam, Propofol infusion, TIVA and Treatment may vary due to age or medical condition  Airway Management Planned: LMA  Additional Equipment:   Intra-op Plan:   Post-operative Plan: Extubation in OR  Informed Consent: I have reviewed the patients History and Physical, chart, labs and discussed the procedure including the risks, benefits and alternatives for the proposed anesthesia with the patient or authorized representative who has indicated his/her understanding and acceptance.     Dental advisory given  Plan Discussed with: CRNA  Anesthesia Plan Comments:        Anesthesia Quick Evaluation

## 2023-04-24 ENCOUNTER — Encounter (HOSPITAL_BASED_OUTPATIENT_CLINIC_OR_DEPARTMENT_OTHER): Payer: Self-pay | Admitting: Surgery

## 2023-04-25 LAB — SURGICAL PATHOLOGY

## 2023-04-26 ENCOUNTER — Encounter: Payer: Self-pay | Admitting: Surgery
